# Patient Record
Sex: Male | Born: 1960 | Race: White | Hispanic: No | Marital: Single | Smoking: Never smoker
Health system: Southern US, Community
[De-identification: ages and names within clinical notes are randomized; demographics above are authoritative.]

## PROBLEM LIST (undated history)

## (undated) DIAGNOSIS — N189 Chronic kidney disease, unspecified: Secondary | ICD-10-CM

## (undated) DIAGNOSIS — E119 Type 2 diabetes mellitus without complications: Secondary | ICD-10-CM

## (undated) DIAGNOSIS — R579 Shock, unspecified: Secondary | ICD-10-CM

## (undated) HISTORY — PX: APPENDECTOMY: SHX54

## (undated) HISTORY — PX: BACK SURGERY: SHX140

## (undated) HISTORY — PX: CHOLECYSTECTOMY: SHX55

## (undated) HISTORY — DX: Shock, unspecified: R57.9

## (undated) HISTORY — PX: HERNIA REPAIR: SHX51

---

## 2003-11-20 ENCOUNTER — Ambulatory Visit (HOSPITAL_COMMUNITY): Admission: RE | Admit: 2003-11-20 | Discharge: 2003-11-20 | Payer: Self-pay | Admitting: Internal Medicine

## 2013-12-12 ENCOUNTER — Encounter: Payer: Self-pay | Admitting: Gastroenterology

## 2015-03-05 ENCOUNTER — Other Ambulatory Visit: Payer: Self-pay | Admitting: Occupational Medicine

## 2015-03-05 ENCOUNTER — Ambulatory Visit: Payer: Self-pay

## 2015-03-05 DIAGNOSIS — M5441 Lumbago with sciatica, right side: Secondary | ICD-10-CM

## 2017-06-13 ENCOUNTER — Other Ambulatory Visit: Payer: Self-pay | Admitting: Orthopaedic Surgery

## 2017-06-13 DIAGNOSIS — M4326 Fusion of spine, lumbar region: Secondary | ICD-10-CM

## 2017-06-21 ENCOUNTER — Ambulatory Visit
Admission: RE | Admit: 2017-06-21 | Discharge: 2017-06-21 | Disposition: A | Discharge status: Home / Self Care | Payer: Worker's Compensation | Source: Ambulatory Visit | Attending: Orthopaedic Surgery | Admitting: Orthopaedic Surgery

## 2017-06-21 DIAGNOSIS — M4326 Fusion of spine, lumbar region: Secondary | ICD-10-CM

## 2017-06-21 MED ORDER — GADOBENATE DIMEGLUMINE 529 MG/ML IV SOLN
20.0000 mL | Freq: Once | INTRAVENOUS | Status: AC | PRN
  Administered 2017-06-21: 20 mL via INTRAVENOUS

## 2017-09-11 ENCOUNTER — Ambulatory Visit (HOSPITAL_BASED_OUTPATIENT_CLINIC_OR_DEPARTMENT_OTHER): Payer: Worker's Compensation | Admitting: Physical Medicine & Rehabilitation

## 2017-09-11 ENCOUNTER — Encounter: Payer: Self-pay | Admitting: Physical Medicine & Rehabilitation

## 2017-09-11 ENCOUNTER — Encounter: Payer: Worker's Compensation | Attending: Physical Medicine & Rehabilitation

## 2017-09-11 VITALS — BP 106/71 | HR 108 | Resp 14

## 2017-09-11 DIAGNOSIS — G629 Polyneuropathy, unspecified: Secondary | ICD-10-CM | POA: Insufficient documentation

## 2017-09-11 DIAGNOSIS — M47816 Spondylosis without myelopathy or radiculopathy, lumbar region: Secondary | ICD-10-CM | POA: Diagnosis not present

## 2017-09-11 DIAGNOSIS — M961 Postlaminectomy syndrome, not elsewhere classified: Secondary | ICD-10-CM | POA: Insufficient documentation

## 2017-09-11 DIAGNOSIS — G8929 Other chronic pain: Secondary | ICD-10-CM | POA: Diagnosis not present

## 2017-09-11 DIAGNOSIS — Z79899 Other long term (current) drug therapy: Secondary | ICD-10-CM | POA: Insufficient documentation

## 2017-09-11 NOTE — Patient Instructions (Signed)
Rec Plank exercise please ask PT to show proper technique

## 2017-09-11 NOTE — Progress Notes (Signed)
Subjective:    Patient ID: Allen Russell, male    DOB: May 20, 1961, 57 y.o.   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  Felt onset of pain that same day after full day of work.Saw an MD ~1 wk later. Seen by ortho spine, Dr Noel Geroldohen <3359month post injury.  Pt was treated with prednisone dose pack, tried PT until Dec 2016.  Underwent Lumbar injections per Dr Retia PasseSaullo in 2017 which were not helpful.   L2-3 PSF January 31, 2016 Dr Noel Geroldohen Had 3 mo of walking only.  Started in PT in Dec 2017, did both land based as well as aquatic therapy.  Right lower back pain with hip and thigh have been most affected.  No numbness.  Has had episodes where knees give out.    Pt has had foot numbness for many years, EMG showed sensorimotor polyneuropathy  Prolonged sitting and standing aggravate Right sided low back pain  Tramadol helps with pain.  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 General activity 3 Relation with others 7 Enjoyment of life 7 What TIME of day is your pain at its worst? varies Sleep (in general) Poor  Pain is worse with: bending, sitting and standing Pain improves with: heat/ice, therapy/exercise, medication and TENS Relief from Meds: 6  Mobility walk with assistance use a cane how many minutes can you walk? 45 ability to climb steps?  yes do you drive?  yes Do you have any goals in this area?  yes  Function not employed: date last employed . I need assistance with the following:  household duties  Neuro/Psych weakness trouble walking dizziness  Prior Studies new MRI LUMBAR SPINE WITHOUT AND WITH CONTRAST    TECHNIQUE: Multiplanar and multiecho pulse sequences of the lumbar spine were obtained without and with intravenous contrast.   CONTRAST:  20mL MULTIHANCE GADOBENATE DIMEGLUMINE 529 MG/ML IV SOLN   COM[facility]:  [facility] Postoperative lumbar radiographs 02/02/2016. Intraoperative radiographs 01/31/2016. Preoperative lumbar radiographs 01/28/2016. CT Abdomen and Pelvis 03/28/2011. No prior MRI is available.   FINDINGS: Segmentation:  Normal as demonstrated on the comparisons.   Alignment: Straightening of lower lumbar lordosis has mildly progressed since 2012. Slight retrolisthesis of L2 on L3 is chronic and appears stable from the postoperative radiographs.   Vertebrae: Incidental benign vertebral body hemangioma on the left at T11 has not significantly changed since 2012. Occasional other small benign hemangiomas. Background bone marrow signal is within normal limits. Mild hardware susceptibility artifact at L2-L3. No marrow edema or evidence of acute osseous abnormality. Intact visible sacrum and SI joints.   Conus medullaris and cauda equina: Conus extends to the T12-L1 level. Conus and cauda equina appear normal. No abnormal intradural enhancement. No dural thickening or enhancement identified.   Paraspinal and other soft tissues: Postoperative changes to the posterior upper lumbar paraspinal soft tissues. No postoperative fluid collection. Negative visualized abdominal viscera.   Disc levels:   T10-T11: Mild to moderate facet hypertrophy. No spinal stenosis, but probable moderate right T11 neural foraminal stenosis (series 6, image 1).   T11-12:  Negative.   T12-L1: Minimal  disc bulge and/or subtle right paracentral disc protrusion. No stenosis.   L1-L2: Mild mostly anterior disc bulging and endplate spurring. No stenosis.   L2-L3: Sequelae of decompression, posterior and interbody fusion. Residual right foraminal endplate spurring, but no  definite stenosis.   L3-L4: Mild mostly far lateral disc bulging and endplate spurring. Mild facet hypertrophy. No significant stenosis.   L4-L5: Minimal circumferential disc bulge and endplate spurring. Mild facet hypertrophy. No stenosis.   L5-S1:  Borderline to mild facet hypertrophy.  No stenosis.   IMPRESSION: 1. Prior decompression and fusion at L2-L3. Residual right foraminal broad-based endplate spurring, but no definite stenosis. Lumbar spine CT would best evaluate the degree of postoperative arthrodesis. 2. Only mild adjacent segment disease at L1-L2 and L3-L4 with no convincing neural impingement. 3. Minimal to mild for age lumbar spine degeneration elsewhere. 4. Intermittent lower thoracic disc and posterior element degeneration. Probable associated right T11 neural foraminal stenosis.     Electronically Signed   By: Odessa Fleming M.D.   On: 06/21/2017 09:23  Physicians involved in your care new   History reviewed. No pertinent family history. Social History   Socioeconomic History  . Marital status: Single    Spouse name: None  . Number of children: None  . Years of education: None  . Highest education level: None  Social Needs  . Financial resource strain: None  . Food insecurity - worry: None  . Food insecurity - inability: None  . Transportation needs - medical: None  . Transportation needs - non-medical: None  Occupational History  . None  Tobacco Use  . Smoking status: Never Smoker  . Smokeless tobacco: Never Used  Substance and Sexual Activity  . Alcohol use: None  . Drug use: None  . Sexual activity: None  Other Topics Concern  . None  Social History Narrative  . None   History reviewed. No pertinent surgical history. History reviewed. No pertinent past medical history. BP 106/71 (BP Location: Right Arm, Patient Position: Sitting, Cuff Size: Normal)   Pulse (!) 108   Resp 14   SpO2 98%   Opioid Risk Score:   Fall Risk Score:   `1  Depression screen PHQ 2/9  No flowsheet data found.  Review of Systems  HENT: Negative.   Eyes: Negative.   Respiratory: Negative.   Cardiovascular: Negative.   Gastrointestinal: Negative.   Endocrine: Negative.   Genitourinary: Negative.   Musculoskeletal: Positive for back pain.  Skin: Negative.   Allergic/Immunologic: Negative.   Neurological: Positive for dizziness and weakness.  Hematological: Negative.   Psychiatric/Behavioral: Negative.   All other systems reviewed and are negative.      Objective:   Physical Exam  Constitutional: He is oriented to person, place, and time. He appears well-developed and well-nourished. No distress.  HENT:  Head: Normocephalic and atraumatic.  Eyes: Conjunctivae and EOM are normal. Pupils are equal, round, and reactive to light.  Neck: Normal range of motion. Neck supple.  Cardiovascular: Normal rate and regular rhythm.  Pulmonary/Chest: Effort normal and breath sounds normal. No respiratory distress. He has no wheezes.  Abdominal: Soft. Bowel sounds are normal. He exhibits no distension. There is no tenderness.  Musculoskeletal: He exhibits no edema.       Right knee: Normal.       Left knee: Normal.       Right foot: Normal.       Left foot: Normal.  Negative straight leg raising test  Lumbar spine has 75% flexion  25% extension 25% lateral bending and rotation.  Pain is mainly in the right lumbar area with lumbar extension and with right lateral bending.  Neurological: He is alert and oriented to person, place, and time. He displays no atrophy and no tremor. He exhibits normal muscle tone. Coordination and gait normal.  Reflex Scores:      Tricep reflexes are 2+ on the right side and 2+ on the left side.      Bicep reflexes are 2+ on the right side and 2+ on the left side.      Brachioradialis reflexes are 2+ on the right side and 2+ on the left side.      Patellar reflexes are 1+ on the right side and 2+ on the left side.       Achilles reflexes are 0 on the right side and 0 on the left side. Motor strength is 5/5 bilateral deltoid, bicep, tricep, grip, hip flexor, knee extensor, ankle dorsiflexor Sensation is reduced below the patella in the right lower extremity and below the tibial tubercle on the left lower extremity  Skin: Skin is warm and dry. He is not diaphoretic.  Psychiatric: He has a normal mood and affect. His behavior is normal. Judgment and thought content normal.  Nursing note and vitals reviewed.         Assessment & Plan:  1.  Lumbar postlaminectomy syndrome with chronic pain.  Pain is mainly below the surgical site. I suspect this may be originating from L3-4 L4-5 facet joints.  We will do L2-L3-L4 medial branch blocks on the right side under fluoroscopic guidance.  Agree with Dr. Noel Gerold partial permanent impairment rating of 20% Agree with FCE based permanent restrictions of 20 pound lifting, 45-minute sitting for driving.  8 hours/day limitation  Agree with tramadol 50 mg 3 times daily or 4 times daily as needed for pain Agree with gabapentin, may be able to wean down from 600 4 times daily to a lower dose  Agree with physical therapy, work on core stabilization exercises, have noted on MRI diffuse fatty infiltration of the lumbar extensor muscles.  Would use plank exercises to help build these up.  Would start with kneeling exercises and progress.  2.  Polyneuropathy question etiology does have a history of alcohol abuse in the past may have some residual neuropathy from this. Gabapentin can help with this etiology although it is not a work-related injury.  Prolonged visit: I discussed my recommendations with the patient as well as his case manager Leonard Downing fax [phone]

## 2017-09-28 ENCOUNTER — Encounter: Payer: Self-pay | Attending: Physical Medicine & Rehabilitation

## 2017-09-28 ENCOUNTER — Other Ambulatory Visit: Payer: Self-pay

## 2017-09-28 ENCOUNTER — Encounter: Payer: Self-pay | Admitting: Physical Medicine & Rehabilitation

## 2017-09-28 ENCOUNTER — Ambulatory Visit (HOSPITAL_BASED_OUTPATIENT_CLINIC_OR_DEPARTMENT_OTHER): Payer: Worker's Compensation | Admitting: Physical Medicine & Rehabilitation

## 2017-09-28 VITALS — BP 118/71 | HR 100 | Ht 76.0 in | Wt 220.0 lb

## 2017-09-28 DIAGNOSIS — Z79899 Other long term (current) drug therapy: Secondary | ICD-10-CM | POA: Insufficient documentation

## 2017-09-28 DIAGNOSIS — M47816 Spondylosis without myelopathy or radiculopathy, lumbar region: Secondary | ICD-10-CM

## 2017-09-28 DIAGNOSIS — Z5181 Encounter for therapeutic drug level monitoring: Secondary | ICD-10-CM | POA: Insufficient documentation

## 2017-09-28 MED ORDER — TRAMADOL HCL 50 MG PO TABS: 50.0000 mg | ORAL_TABLET | Freq: Four times a day (QID) | ORAL | 1 refills | Status: DC | PRN

## 2017-09-28 MED ORDER — CYCLOBENZAPRINE HCL 10 MG PO TABS: 10.0000 mg | ORAL_TABLET | Freq: Two times a day (BID) | ORAL | 1 refills | Status: DC | PRN

## 2017-09-28 MED ORDER — GABAPENTIN 600 MG PO TABS: 600.0000 mg | ORAL_TABLET | Freq: Four times a day (QID) | ORAL | 1 refills | Status: DC

## 2017-09-28 NOTE — Progress Notes (Signed)
Right lumbar  L3, L4 medial branch blocks under fluoroscopic guidance  Indication: Right Lumbar pain which is not relieved by medication management or other conservative care and interfering with self-care and mobility.  Informed consent was obtained after describing risks and benefits of the procedure with the patient, this includes bleeding, bruising, infection, paralysis and medication side effects. The patient wishes to proceed and has given written consent. The patient was placed in a prone position. The lumbar area was marked and prepped with Betadine. One ML of 1% lidocaine was injected into each of 3 areas into the skin and subcutaneous tissue.  Then the Right L5 superior articular process in transverse process junction was targeted. Bone contact was made.Isovue 200 was injected x0.5 mL demonstrating no intravascular uptake. Then a solution containing 2% MPF lidocaine was injected x0.5 mL. Then the Right L4 superior articular process in transverse process junction was targeted. Bone contact was made. Isovue 200 was injected x0.5 mL demonstrating no intravascular uptake. Then a solution containing2% MPF lidocaine was injected x0.5 mL .  Marland Kitchen. Post procedure instructions were given. Please refer to post procedure form.  Indication for chronic opioid: Lumbar post laminectomy syndrome Medication and dose: Tramadol 50mg  QID # pills per month: 120 Last UDS date: 09/28/17 Opioid Treatment Agreement signed (Y/N): Y Opioid Treatment Agreement last reviewed with patient:  09/28/2017 [facility] reviewed this encounter (include red flags):  09/28/2017

## 2017-09-28 NOTE — Patient Instructions (Signed)

## 2017-09-28 NOTE — Progress Notes (Signed)
  PROCEDURE RECORD [name]   Name: Allen Russell DOB:[birth year] MRN: [id number]  Date:09/28/2017  Physician: Claudette LawsAndrew Kirsteins, MD    Nurse/CMA: Barbee ShropshireBruce Bright CMA  Allergies:  Allergies  Allergen Reactions  . Morphine Other (See Comments)    headache headache headaches     Consent Signed: Yes.    Is patient diabetic? No.  CBG today?   Pregnant: No. LMP: No LMP for male patient. (age 55-55)  Anticoagulants: no Anti-inflammatory: no Antibiotics: no  Procedure: right L 3-4 Medial Branch Block, Right L5  Dorsal Ramus Position: Prone Start Time: 1146am      End Time:  1148am      Fluoro Time: 25s  RN/CMA Shumaker RN Bright CMA    Time 11:23am 11:55am    BP 118/71 103/69    Pulse 100 97    Respirations 14 16    O2 Sat 97 97    S/S 6 6    Pain Level 4/10 2/10     D/C home with nephew Allen Russell, [name] & O X 3, D/C instructions reviewed, and sits independently.

## 2017-10-05 LAB — TOXASSURE SELECT,+ANTIDEPR,UR

## 2017-10-09 ENCOUNTER — Telehealth: Payer: Self-pay | Admitting: *Deleted

## 2017-10-09 NOTE — Telephone Encounter (Signed)
Urine drug screen for this encounter is consistent for prescribed medication 

## 2017-10-30 ENCOUNTER — Encounter: Payer: Self-pay | Admitting: Physical Medicine & Rehabilitation

## 2017-10-30 ENCOUNTER — Encounter: Payer: Worker's Compensation | Attending: Physical Medicine & Rehabilitation

## 2017-10-30 ENCOUNTER — Other Ambulatory Visit: Payer: Self-pay

## 2017-10-30 ENCOUNTER — Ambulatory Visit (HOSPITAL_BASED_OUTPATIENT_CLINIC_OR_DEPARTMENT_OTHER): Payer: Worker's Compensation | Admitting: Physical Medicine & Rehabilitation

## 2017-10-30 DIAGNOSIS — Z5181 Encounter for therapeutic drug level monitoring: Secondary | ICD-10-CM | POA: Insufficient documentation

## 2017-10-30 DIAGNOSIS — Z79899 Other long term (current) drug therapy: Secondary | ICD-10-CM | POA: Insufficient documentation

## 2017-10-30 DIAGNOSIS — M47816 Spondylosis without myelopathy or radiculopathy, lumbar region: Secondary | ICD-10-CM | POA: Diagnosis present

## 2017-10-30 NOTE — Patient Instructions (Signed)

## 2017-10-30 NOTE — Progress Notes (Signed)
Right lumbar  L3, L4 medial branch blocks under fluoroscopic guidance  Indication: Right Lumbar pain which is not relieved by medication management or other conservative care and interfering with self-care and mobility.  Informed consent was obtained after describing risks and benefits of the procedure with the patient, this includes bleeding, bruising, infection, paralysis and medication side effects. The patient wishes to proceed and has given written consent. The patient was placed in a prone position. The lumbar area was marked and prepped with Betadine. One ML of 1% lidocaine was injected into each of 3 areas into the skin and subcutaneous tissue.  Then the Right L5 superior articular process in transverse process junction was targeted. Bone contact was made.Isovue 200 was injected x0.5 mL demonstrating no intravascular uptake. Then a solution containing 2% MPF lidocaine was injected x0.5 mL. Then the Right L4 superior articular process in transverse process junction was targeted. Bone contact was made. Isovue 200 was injected x0.5 mL demonstrating no intravascular uptake. Then a solution containing2% MPF lidocaine was injected x0.5 mL .  Marland Kitchen Post procedure instructions were given. Please refer to post procedure form.

## 2017-10-30 NOTE — Progress Notes (Signed)
  PROCEDURE RECORD [facility]   Name: Allen Russell DOB:[birth year] MRN: [id number]  Date:10/30/2017  Physician: Claudette Laws, MD    Nurse/CMA: Alyviah Crandle,CMA/Shumaker,RN  Allergies:  Allergies  Allergen Reactions  . Morphine Other (See Comments)    headache headache headaches     Consent Signed: Yes.    Is patient diabetic? No.  CBG today?   Pregnant: No. LMP: No LMP for male patient. (age 55-55)  Anticoagulants: no Anti-inflammatory: no Antibiotics: no  Procedure: Medical Branch Block Position: Prone Start Time: 3:50pm End Time: 3:55pm Fluoro Time:19  RN/CMA Kathalina Ostermann,CMA/Shumaker,RN Genia Perin, CMA/Shumaker,RN    Time 2:21pm 2:29pm    BP 91/64 85/61    Pulse 107 107    Respirations 14 14    O2 Sat 94 93    S/S 6 6    Pain Level 3 2     D/C home with Dad, [name] & O X 3, D/C instructions reviewed, and sits independently.  Denies being symptomatic with low BP. Has been running low previously.  Encouraged to monitor after he gets home.

## 2017-11-20 ENCOUNTER — Telehealth: Payer: Self-pay | Admitting: Physical Medicine & Rehabilitation

## 2017-11-20 NOTE — Telephone Encounter (Signed)
Patient wanted to let Dr. Wynn Banker know that last injection didn't work.  He didn't know if he needed to change the appointment that he has next week for RF.  Please advise.

## 2017-11-20 NOTE — Telephone Encounter (Signed)
May cancel RF and make a follow-up appointment with me

## 2017-11-29 ENCOUNTER — Other Ambulatory Visit: Payer: Self-pay

## 2017-11-29 ENCOUNTER — Ambulatory Visit (HOSPITAL_BASED_OUTPATIENT_CLINIC_OR_DEPARTMENT_OTHER): Payer: Worker's Compensation | Admitting: Physical Medicine & Rehabilitation

## 2017-11-29 ENCOUNTER — Encounter: Payer: Self-pay | Admitting: Physical Medicine & Rehabilitation

## 2017-11-29 ENCOUNTER — Encounter: Payer: Worker's Compensation | Attending: Physical Medicine & Rehabilitation

## 2017-11-29 VITALS — BP 105/71 | HR 82 | Ht 76.0 in | Wt 228.0 lb

## 2017-11-29 DIAGNOSIS — G8929 Other chronic pain: Secondary | ICD-10-CM | POA: Diagnosis not present

## 2017-11-29 DIAGNOSIS — G629 Polyneuropathy, unspecified: Secondary | ICD-10-CM | POA: Diagnosis not present

## 2017-11-29 DIAGNOSIS — M961 Postlaminectomy syndrome, not elsewhere classified: Secondary | ICD-10-CM | POA: Insufficient documentation

## 2017-11-29 DIAGNOSIS — M47816 Spondylosis without myelopathy or radiculopathy, lumbar region: Secondary | ICD-10-CM

## 2017-11-29 DIAGNOSIS — Z79899 Other long term (current) drug therapy: Secondary | ICD-10-CM | POA: Insufficient documentation

## 2017-11-29 NOTE — Progress Notes (Signed)
Subjective:    Patient ID: Allen Russell, male    DOB: [birth year], 57 y.o.   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  Felt onset of pain that same day after full day of work.Saw an MD ~1 wk later. Seen by ortho spine, Dr Noel Gerold <75month post injury.  Pt was treated with prednisone dose pack, tried PT until Dec 2016.  Underwent Lumbar injections per Dr Retia Passe in 2017 which were not helpful.   L2-3 PSF January 31, 2016 Dr Noel Gerold Had 3 mo of walking only.  Started in PT in Dec 2017, did both land based as well as aquatic therapy.  Right lower back pain with hip and thigh have been most affected.  No numbness.  Has had episodes where knees give out.    Pt has had foot numbness for many years, EMG showed sensorimotor polyneuropathy  Prolonged sitting and standing aggravate Right sided low back pain  Tramadol helps with pain.  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  Home Ex is Dumbells for Biceps, triceps and shoulders Therabands for Hip flexors Last formal PT was last weekend  I tried to check the work requirements of his current position.  The copy sent was not legible.  Previous FCE showed patient capable of light duty but reviewing physician did not feel patient's job description could be filled with the restrictions.  09/28/2017 right lumbar  L3, L4 medial branch blocks under fluoroscopic guidance, 50% relief for approximately 1 week  4/30/2019Right lumbar  L3, L4 medial branch blocks under fluoroscopic guidance, less than 50% relief   Considering gym membership which I would support.  Pain Inventory Average Pain 5 Pain Right Now 3 My pain is intermittent, sharp  and stabbing  In the last 24 hours, has pain interfered with the following? General activity 2 Relation with others 1 Enjoyment of life 3 What TIME of day is your pain at its worst? evening Sleep (in general) Poor  Pain is worse with: standing and some activites Pain improves with: heat/ice, medication and TENS Relief from Meds: 3  Mobility walk without assistance how many minutes can you walk? 30 ability to climb steps?  yes do you drive?  yes Do you have any goals in this area?  yes  Function not employed: date last employed 02/26/15 disabled: date disabled 02/26/15 Do you have any goals in this area?  yes  Neuro/Psych No problems in this area  Prior Studies Any changes since last visit?  no  Physicians involved in your care Any changes since last visit?  no   No family history on file. Social History   Socioeconomic History  . Marital status: Single    Spouse name: Not on file  . Number of children: Not on file  . Years of education: Not on file  . Highest education level: Not on file  Occupational History  . Not on file  Social Needs  . Financial resource strain: Not on file  . Food insecurity:    Worry: Not on file    Inability: Not on file  . Transportation needs:    Medical: Not on file    Non-medical:  Not on file  Tobacco Use  . Smoking status: Never Smoker  . Smokeless tobacco: Never Used  Substance and Sexual Activity  . Alcohol use: Not on file  . Drug use: Not on file  . Sexual activity: Not on file  Lifestyle  . Physical activity:    Days per week: Not on file    Minutes per session: Not on file  . Stress: Not on file  Relationships  . Social connections:    Talks on phone: Not on file    Gets together: Not on file    Attends religious service: Not on file    Active member of club or organization: Not on file    Attends meetings of clubs or organizations: Not on file    Relationship status: Not on file  Other Topics Concern  . Not  on file  Social History Narrative  . Not on file   No past surgical history on file. No past medical history on file. BP 105/71   Pulse 82   Ht  (1.93 m)   Wt 228 lb (103.4 kg)   SpO2 96%   BMI 27.75 kg/m   Opioid Risk Score:   Fall Risk Score:  `1  Depression screen PHQ 2/9  Depression screen [facility] 2/9 11/29/2017 10/30/2017 09/28/2017 09/11/2017  Decreased Interest 0 0 0 0  Down, Depressed, Hopeless 0 0 0 -  PHQ - 2 Score 0 0 0 0    Review of Systems  Constitutional: Negative.   HENT: Negative.   Eyes: Negative.   Respiratory: Negative.   Cardiovascular: Negative.   Gastrointestinal: Negative.   Endocrine: Negative.   Genitourinary: Negative.   Musculoskeletal: Negative.   Skin: Negative.   Allergic/Immunologic: Negative.   Neurological: Negative.   Hematological: Negative.   Psychiatric/Behavioral: Negative.   All other systems reviewed and are negative.      Objective:   Physical Exam  Constitutional: He is oriented to person, place, and time. He appears well-developed and well-nourished. No distress.  HENT:  Head: Normocephalic and atraumatic.  Eyes: Pupils are equal, round, and reactive to light. EOM are normal.  Neck: Normal range of motion.  Musculoskeletal: Normal range of motion.  Neurological: He is alert and oriented to person, place, and time.  Skin: Skin is warm and dry. He is not diaphoretic.  Nursing note and vitals reviewed.  Patient has positive Faber's on the right side at the sacroiliac area. Negative straight leg raising Negative femoral stretch test  Left little toe numb to pp Mild Right glut pain with Ext rotation    Assessment & Plan:  1.  Chronic low back pain following L2-L3 fusion.  Patient did not get substantial relief from right L3-L4 medial branch blocks. We discussed other pain generating fractures including L1-L2 facet joint complex, right sacroiliac joint as well as right L2 nerve root. Will proceed with right T12-L1  medial branch block to reduce pain from the L1-L2 facet joint complex. Will advise continued home exercise program as outlined by physical therapy Will need FCE results as well as job description to compare.  Not at MMI from pain management standpoint.  May need to undergo 2 or 3 more procedures to evaluate the pain and potentially additional procedure for neurotomy once pain generator  Identified.  Discussed overall goal reduction of pain medications with patient.  I agree with the nightly cyclobenzaprine avoid daytime use, continued use of gabapentin may be able to reduce dose however patient is not  experiencing any side effects from this medication.  In terms of his opioid, the tramadol dose will continue 3 times daily or 4 times daily for now and should be able to wean at least to a lower dose in the future

## 2017-11-29 NOTE — Patient Instructions (Signed)
Blocking L1,2 facet joint

## 2017-12-03 ENCOUNTER — Telehealth: Payer: Self-pay | Admitting: Physical Medicine & Rehabilitation

## 2017-12-03 NOTE — Telephone Encounter (Signed)
Atty Trula OreSharon Altmon [phone] has faxed (2) emailed and mailed hard copy - Dr. Wynn BankerKirsteins cannot read this document and will not complete it with out copy legible. Emailed atty to advise.

## 2017-12-14 ENCOUNTER — Encounter: Payer: Self-pay | Admitting: Physical Medicine & Rehabilitation

## 2017-12-14 ENCOUNTER — Ambulatory Visit (HOSPITAL_BASED_OUTPATIENT_CLINIC_OR_DEPARTMENT_OTHER): Payer: Worker's Compensation | Admitting: Physical Medicine & Rehabilitation

## 2017-12-14 ENCOUNTER — Encounter: Payer: BLUE CROSS/BLUE SHIELD | Attending: Physical Medicine & Rehabilitation

## 2017-12-14 VITALS — BP 117/77 | HR 101 | Resp 14 | Ht 76.0 in | Wt 227.0 lb

## 2017-12-14 DIAGNOSIS — G629 Polyneuropathy, unspecified: Secondary | ICD-10-CM | POA: Diagnosis not present

## 2017-12-14 DIAGNOSIS — G8929 Other chronic pain: Secondary | ICD-10-CM | POA: Insufficient documentation

## 2017-12-14 DIAGNOSIS — M47816 Spondylosis without myelopathy or radiculopathy, lumbar region: Secondary | ICD-10-CM | POA: Diagnosis not present

## 2017-12-14 DIAGNOSIS — M961 Postlaminectomy syndrome, not elsewhere classified: Secondary | ICD-10-CM | POA: Insufficient documentation

## 2017-12-14 DIAGNOSIS — Z79899 Other long term (current) drug therapy: Secondary | ICD-10-CM | POA: Diagnosis not present

## 2017-12-14 MED ORDER — GABAPENTIN 600 MG PO TABS: 600.0000 mg | ORAL_TABLET | Freq: Four times a day (QID) | ORAL | 1 refills | Status: DC

## 2017-12-14 MED ORDER — TRAMADOL HCL 50 MG PO TABS: 50.0000 mg | ORAL_TABLET | Freq: Four times a day (QID) | ORAL | 1 refills | Status: DC | PRN

## 2017-12-14 MED ORDER — CYCLOBENZAPRINE HCL 10 MG PO TABS: 10.0000 mg | ORAL_TABLET | Freq: Two times a day (BID) | ORAL | 1 refills | Status: DC | PRN

## 2017-12-14 NOTE — Progress Notes (Signed)
  PROCEDURE RECORD [facility]   Name: Allen Russell DOB:03/27/1961 MRN: [id number]  Date:12/14/2017  Physician: Claudette LawsAndrew Kirsteins, MD    Nurse/CMA: Bobette Leyh, CMA  Allergies:  Allergies  Allergen Reactions  . Morphine Other (See Comments)    headache headache headaches     Consent Signed: Yes.    Is patient diabetic? No.  CBG today?   Pregnant: No. LMP: No LMP for male patient. (age 55-55)  Anticoagulants: no Anti-inflammatory: no Antibiotics: no  Procedure: right T12,L1 medial branch block  Position: Prone Start Time: 3:16pm  End Time:3:26pm   Fluoro Time: 22  RN/CMA Uilani Sanville, CMA Jacquelinne Speak, CMA    Time 3:00pm 3:30pm    BP 117/77 122/75    Pulse 101 98    Respirations 14 14    O2 Sat 97 99    S/S 6 6    Pain Level 6/10 3/10     D/C home with dad, [name] & O X 3, D/C instructions reviewed, and sits independently.

## 2017-12-14 NOTE — Progress Notes (Signed)
Right T 12, L1,  medial branch blocks under fluoroscopic guidance  Indication: Right Lumbar pain which is not relieved by medication management or other conservative care and interfering with self-care and mobility.  Informed consent was obtained after describing risks and benefits of the procedure with the patient, this includes bleeding, bruising, infection, paralysis and medication side effects. The patient wishes to proceed and has given written consent. The patient was placed in a prone position. The lumbar area was marked and prepped with Betadine. One ML of 1% lidocaine was injected into each of 2 areas into the skin and subcutaneous tissue. Then a 22-gauge 3.5in spinal needle was inserted targeting the junction of the the Right L2 superior articular process / transverse process . Bone contact was made. Omnipaque 180 was injected x0.5 mL demonstrating no intravascular uptake. Then a solution containing 2% MPF lidocaine was injected x0.5 mL. Then the Right L1 superior articular process in transverse process junction was targeted. Bone contact was made. Omnipaque 180 was injected x0.5 mL demonstrating no intravascular uptake. Then a solution containing  2% MPF lidocaine was injected x0.5 mL. Patient tolerated procedure well. Post procedure instructions were given. Please refer to post procedure form.

## 2017-12-14 NOTE — Patient Instructions (Signed)
As discussed

## 2017-12-24 ENCOUNTER — Telehealth: Payer: Self-pay

## 2017-12-24 NOTE — Telephone Encounter (Signed)
Pt called stating he had good relief for 7 days from last Medial Branch Block and next appt is 7/12. He is requesting to move the appt up for another Medial Branch Block or Radiofrequency.

## 2017-12-31 NOTE — Telephone Encounter (Signed)
I think we could change the next appointment to radiofrequency neurotomy right L3-4-5

## 2018-01-01 NOTE — Telephone Encounter (Signed)
Mr  [place] notified.

## 2018-01-08 ENCOUNTER — Ambulatory Visit: Payer: BLUE CROSS/BLUE SHIELD | Admitting: Physical Medicine & Rehabilitation

## 2018-01-09 ENCOUNTER — Telehealth: Payer: Self-pay | Admitting: Physical Medicine & Rehabilitation

## 2018-01-09 NOTE — Telephone Encounter (Signed)
What did [employer] deny ?RF Do we have a letter?

## 2018-01-09 NOTE — Telephone Encounter (Signed)
Patient wants to know reason [employer] denied - request call from you please AK.

## 2018-01-11 ENCOUNTER — Ambulatory Visit: Payer: Self-pay | Admitting: Physical Medicine & Rehabilitation

## 2018-01-14 NOTE — Telephone Encounter (Signed)
Not sure why it was denied, you can search my desk for it and I'll look at it tomorrow

## 2018-01-21 ENCOUNTER — Encounter: Payer: Self-pay | Admitting: Physical Medicine & Rehabilitation

## 2018-01-21 ENCOUNTER — Other Ambulatory Visit: Payer: Self-pay

## 2018-01-21 ENCOUNTER — Ambulatory Visit: Payer: BLUE CROSS/BLUE SHIELD | Admitting: Physical Medicine & Rehabilitation

## 2018-01-21 ENCOUNTER — Encounter: Payer: BLUE CROSS/BLUE SHIELD | Attending: Physical Medicine & Rehabilitation

## 2018-01-21 VITALS — BP 111/76 | HR 103 | Ht 76.0 in | Wt 230.4 lb

## 2018-01-21 DIAGNOSIS — G629 Polyneuropathy, unspecified: Secondary | ICD-10-CM | POA: Insufficient documentation

## 2018-01-21 DIAGNOSIS — M47816 Spondylosis without myelopathy or radiculopathy, lumbar region: Secondary | ICD-10-CM | POA: Diagnosis not present

## 2018-01-21 DIAGNOSIS — M961 Postlaminectomy syndrome, not elsewhere classified: Secondary | ICD-10-CM | POA: Diagnosis present

## 2018-01-21 DIAGNOSIS — G8929 Other chronic pain: Secondary | ICD-10-CM | POA: Diagnosis present

## 2018-01-21 DIAGNOSIS — Z79899 Other long term (current) drug therapy: Secondary | ICD-10-CM | POA: Insufficient documentation

## 2018-01-21 NOTE — Progress Notes (Signed)
Subjective:    Patient ID: Allen Russell, male    DOB: 02-Jun-1961, 57 y.o.   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  Felt onset of pain that same day after full day of work.Saw an MD ~1 wk later. Seen by ortho spine, Dr Noel Gerold <4month post injury.  Pt was treated with prednisone dose pack, tried PT until Dec 2016.  Underwent Lumbar injections per Dr Retia Passe in 2017 which were not helpful.   L2-3 PSF January 31, 2016 Dr Noel Gerold Had 3 mo of walking only.  Started in PT in Dec 2017, did both land based as well as aquatic therapy.  Right lower back pain with hip and thigh have been most affected.  No numbness.  Has had episodes where knees give out.    Pt has had foot numbness for many years, EMG showed sensorimotor polyneuropathy  Prolonged sitting and standing aggravate Right sided low back pain HPI Patient returns today reporting that the right T12-L1 injection on 12/14/2017 afforded him the greatest amount of relief thus far.  He had an immediate 50% relief of pain but also was able to increase his activity level for several days afterwards.  He was working in his mother's garden for 5 hours one day without pain.  Previous medial branch blocks on the right side L3-4-5 could not reproduce 50% pain relief on the second injection. Pain Inventory Average Pain 6 Pain Right Now 6 My pain is sharp and stabbing  In the last 24 hours, has pain interfered with the following? General activity 4 Relation with others 4 Enjoyment of life 4 What TIME of day is your pain at its worst? evening night Sleep (in general) Fair  Pain is worse with: bending Pain improves with: rest and medication Relief from Meds: 4  Mobility Do you have any goals in this area?  no  Function Do you have any goals in this area?  no  Neuro/Psych No problems in this area  Prior Studies Any changes since last visit?  no  Physicians involved in your care Any changes  since last visit?  no   No family history on file. Social History   Socioeconomic History  . Marital status: Single    Spouse name: Not on file  . Number of children: Not on file  . Years of education: Not on file  . Highest education level: Not on file  Occupational History  . Not on file  Social Needs  . Financial resource strain: Not on file  . Food insecurity:    Worry: Not on file    Inability: Not on file  . Transportation needs:    Medical: Not on file    Non-medical: Not on file  Tobacco Use  . Smoking status: Never Smoker  . Smokeless tobacco: Never Used  Substance and Sexual Activity  . Alcohol use: Not on file  . Drug use: Not on file  . Sexual activity: Not on file  Lifestyle  . Physical activity:    Days per week: Not on file    Minutes per session: Not on file  . Stress: Not on file  Relationships  . Social connections:    Talks on phone: Not on file    Gets together: Not on file    Attends religious service: Not on file    Active member of club or organization: Not on file    Attends meetings of  clubs or organizations: Not on file    Relationship status: Not on file  Other Topics Concern  . Not on file  Social History Narrative  . Not on file   No past surgical history on file. No past medical history on file. BP 111/76   Pulse (!) 103   Ht 6\' 4"  (1.93 m)   Wt 230 lb 6.4 oz (104.5 kg)   SpO2 97%   BMI 28.05 kg/m   Opioid Risk Score:   Fall Risk Score:  `1  Depression screen PHQ 2/9  [facility] 01/21/2018 11/29/2017 10/30/2017 09/28/2017 09/11/2017  Decreased Interest 0 0 0 0 0  Down, Depressed, Hopeless 0 0 0 0 -  PHQ - 2 Score 0 0 0 0 0   Review of Systems  Constitutional: Negative.   HENT: Negative.   Eyes: Negative.   Respiratory: Negative.   Cardiovascular: Negative.   Gastrointestinal: Negative.   Endocrine: Negative.   Genitourinary: Negative.   Musculoskeletal: Negative.   Skin: Negative.     Allergic/Immunologic: Negative.   Neurological: Negative.   Hematological: Negative.   Psychiatric/Behavioral: Negative.   All other systems reviewed and are negative.      Objective:   Physical Exam  Constitutional: He is oriented to person, place, and time. He appears well-developed and well-nourished.  HENT:  Head: Normocephalic and atraumatic.  Musculoskeletal:  Pain to palpation right L1-L2-L3 paraspinal area.  This increases with movement.  Has limited lumbar range of motion of approximately 50% in all planes Ambulates without assistive device no evidence of toe drag or knee instability.  Neurological: He is alert and oriented to person, place, and time.  Nursing note and vitals reviewed.         Assessment & Plan:  1.  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 Continue tramadol 50 milligrams 4 times daily.  2.  Lumbar spondylosis  most symptomatic at levels above the fusion.  Would recommend repeat T12-L1 given his excellent response greater than 50% relief with improved function following the prior T12-L1 medial branch block performed approximately 1 month ago.  If he obtains a similar relief with the repeat T12-L1 medial branch block, would proceed to radiofrequency neurotomy of the same levels on the right side  I would not recommend right medial branch radiofrequency neurotomy at L3-L4-L5 given lack of reproducibility with the second set of medial branch blocks.

## 2018-01-25 ENCOUNTER — Encounter: Payer: Self-pay | Admitting: Physical Medicine & Rehabilitation

## 2018-01-25 ENCOUNTER — Ambulatory Visit (HOSPITAL_BASED_OUTPATIENT_CLINIC_OR_DEPARTMENT_OTHER): Payer: BLUE CROSS/BLUE SHIELD | Admitting: Physical Medicine & Rehabilitation

## 2018-01-25 VITALS — BP 104/72 | HR 95 | Resp 14 | Ht 76.0 in | Wt 227.0 lb

## 2018-01-25 DIAGNOSIS — M47816 Spondylosis without myelopathy or radiculopathy, lumbar region: Secondary | ICD-10-CM

## 2018-01-25 DIAGNOSIS — G8929 Other chronic pain: Secondary | ICD-10-CM | POA: Diagnosis not present

## 2018-01-25 NOTE — Patient Instructions (Signed)

## 2018-01-25 NOTE — Progress Notes (Signed)
  PROCEDURE RECORD [facility]   Name: Allen Russell DOB:05-23-1961 MRN: [id number]  Date:01/25/2018  Physician: Claudette LawsAndrew Kirsteins, MD    Nurse/CMA: Brydon Spahr, CMA  Allergies:  Allergies  Allergen Reactions  . Morphine Other (See Comments)    headache headache headaches     Consent Signed: Yes.    Is patient diabetic? No.  CBG today?   Pregnant: No. LMP: No LMP for male patient. (age 55-55)  Anticoagulants: no Anti-inflammatory: no Antibiotics: no  Procedure: right T12, L1 medial branch block  Position: Prone Start Time: 9:26am   End Time: 9:35am  Fluoro Time: 21s  RN/CMA [name], CMA [name], CMA    Time 9:00am 9:36am    BP 104/72 104/72    Pulse 95 94    Respirations 16 16    O2 Sat 96 98    S/S 6 6    Pain Level 4/10 1/10     D/C home with dad, [name] & O X 3, D/C instructions reviewed, and sits independently.

## 2018-01-25 NOTE — Progress Notes (Signed)
Right T 12, L1,  medial branch blocks under fluoroscopic guidance  Indication: Right Lumbar pain which is not relieved by medication management or other conservative care and interfering with self-care and mobility.  Informed consent was obtained after describing risks and benefits of the procedure with the patient, this includes bleeding, bruising, infection, paralysis and medication side effects. The patient wishes to proceed and has given written consent. The patient was placed in a prone position. The lumbar area was marked and prepped with Betadine. One ML of 1% lidocaine was injected into each of 2 areas into the skin and subcutaneous tissue. Then a 22-gauge 3.5in spinal needle was inserted targeting the junction of the the Right L2 superior articular process / transverse process . Bone contact was made. Omnipaque 180 was injected x0.5 mL demonstrating no intravascular uptake. Then a solution containing 2% MPF lidocaine was injected x0.5 mL. Then the Right L1 superior articular process in transverse process junction was targeted. Bone contact was made. Omnipaque 180 was injected x0.5 mL demonstrating no intravascular uptake. Then a solution containing  2% MPF lidocaine was injected x0.5 mL. Patient tolerated procedure well. Post procedure instructions were given. Please refer to post procedure form.

## 2018-01-29 ENCOUNTER — Ambulatory Visit: Payer: BLUE CROSS/BLUE SHIELD | Admitting: Physical Medicine & Rehabilitation

## 2018-01-29 ENCOUNTER — Ambulatory Visit: Payer: BLUE CROSS/BLUE SHIELD

## 2018-02-21 ENCOUNTER — Other Ambulatory Visit: Payer: Self-pay | Admitting: Physical Medicine & Rehabilitation

## 2018-02-22 NOTE — Telephone Encounter (Signed)
This was called in yesterday.

## 2018-02-25 ENCOUNTER — Ambulatory Visit: Payer: BLUE CROSS/BLUE SHIELD | Admitting: Physical Medicine & Rehabilitation

## 2018-02-25 ENCOUNTER — Encounter: Payer: BLUE CROSS/BLUE SHIELD | Attending: Physical Medicine & Rehabilitation

## 2018-02-25 DIAGNOSIS — M47816 Spondylosis without myelopathy or radiculopathy, lumbar region: Secondary | ICD-10-CM

## 2018-02-25 DIAGNOSIS — G8929 Other chronic pain: Secondary | ICD-10-CM | POA: Diagnosis present

## 2018-02-25 DIAGNOSIS — Z79899 Other long term (current) drug therapy: Secondary | ICD-10-CM | POA: Insufficient documentation

## 2018-02-25 DIAGNOSIS — G629 Polyneuropathy, unspecified: Secondary | ICD-10-CM | POA: Insufficient documentation

## 2018-02-25 DIAGNOSIS — M961 Postlaminectomy syndrome, not elsewhere classified: Secondary | ICD-10-CM | POA: Diagnosis present

## 2018-02-25 NOTE — Progress Notes (Signed)
  PROCEDURE RECORD [facility]   Name: Allen Russell DOB:[birth year] MRN: [id number]  Date:02/25/2018  Physician: Claudette LawsAndrew Kirsteins, MD    Nurse/Allen Russell: Nedra HaiLee, Allen Russell  Allergies:  Allergies  Allergen Reactions  . Morphine Other (See Comments)    headache headache headaches     Consent Signed: Yes.    Is patient diabetic? Yes.    CBG today?  Pregnant: No. LMP: No LMP for male patient. (age 55-55)  Anticoagulants: no Anti-inflammatory: no Antibiotics: no  Procedure: Radiofrequency Position: Prone Start Time: 11:00am End Time: 11:13am Fluoro Time:23  RN/Allen Russell Allen Russell,Allen Russell Allen Russell, Allen Russell    Time 10:21am 11:20am    BP 109/73 100/68    Pulse 88 82    Respirations 14 14    O2 Sat 97 98    S/S 6 6    Pain Level 4/10 1/10     D/C home with Dad , [name] & O X 3, D/C instructions reviewed, and sits independently.

## 2018-02-25 NOTE — Patient Instructions (Signed)

## 2018-02-25 NOTE — Progress Notes (Signed)
Right T12,L1 medial branch radio frequency neurotomy under fluoroscopic guidance   Indication: Low back pain due to lumbar spondylosis which has been relieved on 2 occasions by greater than 50% by lumbar medial branch blocks at corresponding levels.  Informed consent was obtained after describing risks and benefits of the procedure with the patient, this includes bleeding, bruising, infection, paralysis and medication side effects. The patient wishes to proceed and has given written consent. The patient was placed in a prone position. The lumbar and sacral area was marked and prepped with Betadine. A 25-gauge 1-1/2 inch needle was inserted into the skin and subcutaneous tissue at 3 sites in one ML of 1% lidocaine was injected into each site. Then a 18-gauge 10cm cm radio frequency needle with a 1 cm curved active tip was inserted targeting the  Right L2 SAP/transverse process junction . Bone contact was made and confirmed with lateral imaging.  motor stimulation at 2 Hz confirm proper needle location followed by injection of one ML 1% MPF lidocaine. Then the Right L1 SAP/transverse process junction was targeted. Bone contact was made and confirmed with lateral imaging. motor stimulation at 2 Hz confirm proper needle location followed by injection of one ML 1% MPF lidocaine. Radio frequency lesion being at [facility]80C for 90 seconds was performed. Needles were removed. Post procedure instructions and vital signs were performed. Patient tolerated procedure well. Followup appointment was given.

## 2018-04-22 ENCOUNTER — Other Ambulatory Visit: Payer: Self-pay | Admitting: Physical Medicine & Rehabilitation

## 2018-05-20 ENCOUNTER — Encounter: Payer: Self-pay | Admitting: Physical Medicine & Rehabilitation

## 2018-05-20 ENCOUNTER — Encounter: Payer: BLUE CROSS/BLUE SHIELD | Attending: Physical Medicine & Rehabilitation

## 2018-05-20 ENCOUNTER — Ambulatory Visit: Payer: BLUE CROSS/BLUE SHIELD | Admitting: Physical Medicine & Rehabilitation

## 2018-05-20 VITALS — BP 127/83 | HR 90 | Ht 76.0 in | Wt 238.0 lb

## 2018-05-20 DIAGNOSIS — M47816 Spondylosis without myelopathy or radiculopathy, lumbar region: Secondary | ICD-10-CM | POA: Insufficient documentation

## 2018-05-20 DIAGNOSIS — G8929 Other chronic pain: Secondary | ICD-10-CM | POA: Diagnosis present

## 2018-05-20 DIAGNOSIS — M961 Postlaminectomy syndrome, not elsewhere classified: Secondary | ICD-10-CM | POA: Diagnosis not present

## 2018-05-20 DIAGNOSIS — Z79899 Other long term (current) drug therapy: Secondary | ICD-10-CM | POA: Insufficient documentation

## 2018-05-20 DIAGNOSIS — G629 Polyneuropathy, unspecified: Secondary | ICD-10-CM | POA: Insufficient documentation

## 2018-05-20 NOTE — Progress Notes (Signed)
Test cancelled by provider, patient not taken medication for over a week

## 2018-05-20 NOTE — Progress Notes (Signed)
Subjective:    Patient ID: Allen Russell, male    DOB: Feb 19, 1961, 57 y.o.   MRN: [id number]  HPI Right T12,L1 medial branch radio frequency neurotomy under fluoroscopic guidance  02/25/18 No longer needs tramadol Gabapentin, if misses dose has bilateral leg and foot pain Elliptical 1-2 times a day Slowly advancing free weights  Just diagnosed with diabetes now on metformin On Gabapentin 600mg  QID Pain Inventory Average Pain 1 Pain Right Now 0 My pain is intermittent, sharp and stabbing  In the last 24 hours, has pain interfered with the following? General activity 1 Relation with others 1 Enjoyment of life 1 What TIME of day is your pain at its worst? daytime Sleep (in general) Fair  Pain is worse with: some activites Pain improves with: rest, medication and injections Relief from Meds: 2  Mobility walk without assistance ability to climb steps?  yes do you drive?  yes  Function not employed: date last employed na  Neuro/Psych No problems in this area  Prior Studies Any changes since last visit?  no  Physicians involved in your care Any changes since last visit?  no   No family history on file. Social History   Socioeconomic History  . Marital status: Single    Spouse name: Not on file  . Number of children: Not on file  . Years of education: Not on file  . Highest education level: Not on file  Occupational History  . Not on file  Social Needs  . Financial resource strain: Not on file  . Food insecurity:    Worry: Not on file    Inability: Not on file  . Transportation needs:    Medical: Not on file    Non-medical: Not on file  Tobacco Use  . Smoking status: Never Smoker  . Smokeless tobacco: Never Used  Substance and Sexual Activity  . Alcohol use: Not on file  . Drug use: Not on file  . Sexual activity: Not on file  Lifestyle  . Physical activity:    Days per week: Not on file    Minutes per session: Not on file  . Stress: Not on  file  Relationships  . Social connections:    Talks on phone: Not on file    Gets together: Not on file    Attends religious service: Not on file    Active member of club or organization: Not on file    Attends meetings of clubs or organizations: Not on file    Relationship status: Not on file  Other Topics Concern  . Not on file  Social History Narrative  . Not on file   No past surgical history on file. No past medical history on file. BP 127/83   Pulse 90   Ht 6\' 4"  (1.93 m)   Wt 238 lb (108 kg)   SpO2 97%   BMI 28.97 kg/m   Opioid Risk Score:   Fall Risk Score:  `1  Depression screen PHQ 2/9  Depression screen [facility] 2/9 01/21/2018 11/29/2017 10/30/2017 09/28/2017 09/11/2017  Decreased Interest 0 0 0 0 0  Down, Depressed, Hopeless 0 0 0 0 -  PHQ - 2 Score 0 0 0 0 0     Review of Systems  Constitutional: Negative.   HENT: Negative.   Eyes: Negative.   Respiratory: Negative.   Cardiovascular: Negative.   Gastrointestinal: Negative.   Endocrine: Negative.   Genitourinary: Negative.   Musculoskeletal: Negative.   Skin: Negative.  Allergic/Immunologic: Negative.   Neurological: Negative.   Hematological: Negative.   Psychiatric/Behavioral: Negative.   All other systems reviewed and are negative.      Objective:   Physical Exam  Constitutional: He is oriented to person, place, and time. He appears well-developed and well-nourished.  HENT:  Head: Normocephalic and atraumatic.  Musculoskeletal: Normal range of motion.  Neurological: He is alert and oriented to person, place, and time.  Nursing note and vitals reviewed. Motor 5/5 in Bilateral HF, KE, ADF Sensation reduced pp below supra malleolar area Neg SLR  DTR 2+ bilateral patellar and 0 at achilles       Assessment & Plan:  1.  Hx of L2-3 fusion with pain originating from R L1-2 joint improved temporarily after R T12-L1 Medial branch block and longer term relief with RF of same nerves.  As d/w pt no  way to predict duration.Pt will call once pain returns on consistent basis  No tramadol needed  2.  Diabetic peripheral neuropathy cont gabapentin 600mg  QID

## 2018-06-23 ENCOUNTER — Other Ambulatory Visit: Payer: Self-pay | Admitting: Physical Medicine & Rehabilitation

## 2018-09-05 ENCOUNTER — Other Ambulatory Visit: Payer: Self-pay | Admitting: Physical Medicine & Rehabilitation

## 2021-03-21 ENCOUNTER — Other Ambulatory Visit: Payer: Self-pay

## 2021-03-21 ENCOUNTER — Encounter (HOSPITAL_COMMUNITY): Payer: Self-pay | Admitting: Emergency Medicine

## 2021-03-21 ENCOUNTER — Inpatient Hospital Stay (HOSPITAL_COMMUNITY)
Admission: EM | Admit: 2021-03-21 | Discharge: 2021-04-07 | DRG: 637 | Disposition: A | Discharge status: Home / Self Care | Payer: Self-pay | Attending: Internal Medicine | Admitting: Internal Medicine

## 2021-03-21 DIAGNOSIS — E1110 Type 2 diabetes mellitus with ketoacidosis without coma: Principal | ICD-10-CM | POA: Diagnosis present

## 2021-03-21 DIAGNOSIS — R350 Frequency of micturition: Secondary | ICD-10-CM | POA: Diagnosis present

## 2021-03-21 DIAGNOSIS — Z885 Allergy status to narcotic agent status: Secondary | ICD-10-CM

## 2021-03-21 DIAGNOSIS — E876 Hypokalemia: Secondary | ICD-10-CM | POA: Diagnosis present

## 2021-03-21 DIAGNOSIS — R0602 Shortness of breath: Secondary | ICD-10-CM

## 2021-03-21 DIAGNOSIS — N170 Acute kidney failure with tubular necrosis: Secondary | ICD-10-CM | POA: Diagnosis present

## 2021-03-21 DIAGNOSIS — X58XXXA Exposure to other specified factors, initial encounter: Secondary | ICD-10-CM | POA: Diagnosis present

## 2021-03-21 DIAGNOSIS — F1010 Alcohol abuse, uncomplicated: Secondary | ICD-10-CM | POA: Diagnosis present

## 2021-03-21 DIAGNOSIS — I459 Conduction disorder, unspecified: Secondary | ICD-10-CM | POA: Diagnosis not present

## 2021-03-21 DIAGNOSIS — Z8349 Family history of other endocrine, nutritional and metabolic diseases: Secondary | ICD-10-CM

## 2021-03-21 DIAGNOSIS — E8729 Other acidosis: Secondary | ICD-10-CM

## 2021-03-21 DIAGNOSIS — T730XXA Starvation, initial encounter: Secondary | ICD-10-CM | POA: Diagnosis present

## 2021-03-21 DIAGNOSIS — J90 Pleural effusion, not elsewhere classified: Secondary | ICD-10-CM | POA: Diagnosis present

## 2021-03-21 DIAGNOSIS — Z79899 Other long term (current) drug therapy: Secondary | ICD-10-CM

## 2021-03-21 DIAGNOSIS — G8929 Other chronic pain: Secondary | ICD-10-CM | POA: Diagnosis present

## 2021-03-21 DIAGNOSIS — K861 Other chronic pancreatitis: Secondary | ICD-10-CM | POA: Diagnosis present

## 2021-03-21 DIAGNOSIS — G901 Familial dysautonomia [Riley-Day]: Secondary | ICD-10-CM

## 2021-03-21 DIAGNOSIS — E872 Acidosis: Secondary | ICD-10-CM

## 2021-03-21 DIAGNOSIS — Z8249 Family history of ischemic heart disease and other diseases of the circulatory system: Secondary | ICD-10-CM

## 2021-03-21 DIAGNOSIS — E2740 Unspecified adrenocortical insufficiency: Secondary | ICD-10-CM | POA: Diagnosis present

## 2021-03-21 DIAGNOSIS — D649 Anemia, unspecified: Secondary | ICD-10-CM | POA: Diagnosis present

## 2021-03-21 DIAGNOSIS — K921 Melena: Secondary | ICD-10-CM | POA: Diagnosis present

## 2021-03-21 DIAGNOSIS — R9389 Abnormal findings on diagnostic imaging of other specified body structures: Secondary | ICD-10-CM

## 2021-03-21 DIAGNOSIS — R627 Adult failure to thrive: Secondary | ICD-10-CM

## 2021-03-21 DIAGNOSIS — E860 Dehydration: Secondary | ICD-10-CM | POA: Diagnosis present

## 2021-03-21 DIAGNOSIS — R571 Hypovolemic shock: Secondary | ICD-10-CM | POA: Diagnosis not present

## 2021-03-21 DIAGNOSIS — Z23 Encounter for immunization: Secondary | ICD-10-CM

## 2021-03-21 DIAGNOSIS — R579 Shock, unspecified: Secondary | ICD-10-CM

## 2021-03-21 DIAGNOSIS — R634 Abnormal weight loss: Secondary | ICD-10-CM | POA: Diagnosis present

## 2021-03-21 DIAGNOSIS — Z6827 Body mass index (BMI) 27.0-27.9, adult: Secondary | ICD-10-CM

## 2021-03-21 DIAGNOSIS — E43 Unspecified severe protein-calorie malnutrition: Secondary | ICD-10-CM | POA: Insufficient documentation

## 2021-03-21 DIAGNOSIS — Z7984 Long term (current) use of oral hypoglycemic drugs: Secondary | ICD-10-CM

## 2021-03-21 DIAGNOSIS — Z20822 Contact with and (suspected) exposure to covid-19: Secondary | ICD-10-CM | POA: Diagnosis present

## 2021-03-21 DIAGNOSIS — K863 Pseudocyst of pancreas: Secondary | ICD-10-CM | POA: Diagnosis present

## 2021-03-21 DIAGNOSIS — E861 Hypovolemia: Secondary | ICD-10-CM | POA: Diagnosis present

## 2021-03-21 DIAGNOSIS — Z981 Arthrodesis status: Secondary | ICD-10-CM

## 2021-03-21 DIAGNOSIS — E1142 Type 2 diabetes mellitus with diabetic polyneuropathy: Secondary | ICD-10-CM | POA: Diagnosis present

## 2021-03-21 DIAGNOSIS — K8681 Exocrine pancreatic insufficiency: Secondary | ICD-10-CM | POA: Diagnosis present

## 2021-03-21 DIAGNOSIS — R578 Other shock: Secondary | ICD-10-CM | POA: Diagnosis present

## 2021-03-21 HISTORY — DX: Other acidosis: E87.29

## 2021-03-21 HISTORY — DX: Type 2 diabetes mellitus without complications: E11.9

## 2021-03-21 LAB — CBC WITH DIFFERENTIAL/PLATELET
Abs Immature Granulocytes: 0.04 10*3/uL (ref 0.00–0.07)
Basophils Absolute: 0 10*3/uL (ref 0.0–0.1)
Basophils Relative: 1 %
Eosinophils Absolute: 0 10*3/uL (ref 0.0–0.5)
Eosinophils Relative: 1 %
HCT: 41.8 % (ref 39.0–52.0)
Hemoglobin: 14.7 g/dL (ref 13.0–17.0)
Immature Granulocytes: 1 %
Lymphocytes Relative: 16 %
Lymphs Abs: 0.8 10*3/uL (ref 0.7–4.0)
MCH: 30.9 pg (ref 26.0–34.0)
MCHC: 35.2 g/dL (ref 30.0–36.0)
MCV: 87.8 fL (ref 80.0–100.0)
Monocytes Absolute: 0.8 10*3/uL (ref 0.1–1.0)
Monocytes Relative: 16 %
Neutro Abs: 3.3 10*3/uL (ref 1.7–7.7)
Neutrophils Relative %: 65 %
Platelets: 202 10*3/uL (ref 150–400)
RBC: 4.76 MIL/uL (ref 4.22–5.81)
RDW: 16 % — ABNORMAL HIGH (ref 11.5–15.5)
WBC: 5 10*3/uL (ref 4.0–10.5)
nRBC: 0 % (ref 0.0–0.2)

## 2021-03-21 LAB — COMPREHENSIVE METABOLIC PANEL
ALT: 22 U/L (ref 0–44)
AST: 17 U/L (ref 15–41)
Albumin: 4.4 g/dL (ref 3.5–5.0)
Alkaline Phosphatase: 95 U/L (ref 38–126)
Anion gap: 20 — ABNORMAL HIGH (ref 5–15)
BUN: 24 mg/dL — ABNORMAL HIGH (ref 6–20)
CO2: 9 mmol/L — ABNORMAL LOW (ref 22–32)
Calcium: 9.4 mg/dL (ref 8.9–10.3)
Chloride: 106 mmol/L (ref 98–111)
Creatinine, Ser: 1.35 mg/dL — ABNORMAL HIGH (ref 0.61–1.24)
GFR, Estimated: >60 mL/min (ref >60)
Glucose, Bld: 363 mg/dL — ABNORMAL HIGH (ref 70–99)
Potassium: 3.2 mmol/L — ABNORMAL LOW (ref 3.5–5.1)
Sodium: 135 mmol/L (ref 135–145)
Total Bilirubin: 2.1 mg/dL — ABNORMAL HIGH (ref 0.3–1.2)
Total Protein: 7.4 g/dL (ref 6.5–8.1)

## 2021-03-21 LAB — BASIC METABOLIC PANEL
Anion gap: 12 (ref 5–15)
BUN: 21 mg/dL — ABNORMAL HIGH (ref 6–20)
CO2: 13 mmol/L — ABNORMAL LOW (ref 22–32)
Calcium: 9 mg/dL (ref 8.9–10.3)
Chloride: 110 mmol/L (ref 98–111)
Creatinine, Ser: 1.06 mg/dL (ref 0.61–1.24)
GFR, Estimated: >60 mL/min (ref >60)
Glucose, Bld: 148 mg/dL — ABNORMAL HIGH (ref 70–99)
Potassium: 3 mmol/L — ABNORMAL LOW (ref 3.5–5.1)
Sodium: 135 mmol/L (ref 135–145)

## 2021-03-21 LAB — CBG MONITORING, ED
Glucose-Capillary: 356 mg/dL — ABNORMAL HIGH (ref 70–99)
Glucose-Capillary: 367 mg/dL — ABNORMAL HIGH (ref 70–99)
Glucose-Capillary: 316 mg/dL — ABNORMAL HIGH (ref 70–99)
Glucose-Capillary: 285 mg/dL — ABNORMAL HIGH (ref 70–99)
Glucose-Capillary: 251 mg/dL — ABNORMAL HIGH (ref 70–99)

## 2021-03-21 LAB — BETA-HYDROXYBUTYRIC ACID: Beta-Hydroxybutyric Acid: >8 mmol/L — ABNORMAL HIGH (ref 0.05–0.27)

## 2021-03-21 LAB — URINALYSIS, ROUTINE W REFLEX MICROSCOPIC
Bilirubin Urine: NEGATIVE
Glucose, UA: >=500 mg/dL — AB
Ketones, ur: 80 mg/dL — AB
Leukocytes,Ua: NEGATIVE
Nitrite: NEGATIVE
Protein, ur: 30 mg/dL — AB
Specific Gravity, Urine: 1.025 (ref 1.005–1.030)
pH: 6 (ref 5.0–8.0)

## 2021-03-21 LAB — PROTIME-INR
INR: 1.1 (ref 0.8–1.2)
Prothrombin Time: 13.9 seconds (ref 11.4–15.2)

## 2021-03-21 LAB — BLOOD GAS, ARTERIAL
Acid-base deficit: 23 mmol/L — ABNORMAL HIGH (ref 0.0–2.0)
Bicarbonate: 4.3 mmol/L — ABNORMAL LOW (ref 20.0–28.0)
Drawn by: 29503
FIO2: 21
O2 Saturation: 91.5 %
Patient temperature: 98.6
pCO2 arterial: <19 mmHg — CL (ref 32.0–48.0)
pH, Arterial: 7.191 — CL (ref 7.350–7.450)
pO2, Arterial: 75.8 mmHg — ABNORMAL LOW (ref 83.0–108.0)

## 2021-03-21 LAB — CK: Total CK: 104 U/L (ref 49–397)

## 2021-03-21 LAB — GLUCOSE, CAPILLARY
Glucose-Capillary: 185 mg/dL — ABNORMAL HIGH (ref 70–99)
Glucose-Capillary: 162 mg/dL — ABNORMAL HIGH (ref 70–99)

## 2021-03-21 LAB — MAGNESIUM: Magnesium: 2.2 mg/dL (ref 1.7–2.4)

## 2021-03-21 LAB — APTT: aPTT: 25 seconds (ref 24–36)

## 2021-03-21 LAB — MRSA NEXT GEN BY PCR, NASAL: MRSA by PCR Next Gen: NOT DETECTED

## 2021-03-21 LAB — BRAIN NATRIURETIC PEPTIDE: B Natriuretic Peptide: 41.7 pg/mL (ref 0.0–100.0)

## 2021-03-21 LAB — PHOSPHORUS: Phosphorus: 3.4 mg/dL (ref 2.5–4.6)

## 2021-03-21 MED ORDER — DEXTROSE 50 % IV SOLN: 0.0000 mL | INTRAVENOUS | Status: DC | PRN

## 2021-03-21 MED ORDER — SODIUM CHLORIDE 0.9 % IV BOLUS
1000.0000 mL | Freq: Once | INTRAVENOUS | Status: AC
  Administered 2021-03-21: 1000 mL via INTRAVENOUS

## 2021-03-21 MED ORDER — ORAL CARE MOUTH RINSE
15.0000 mL | Freq: Two times a day (BID) | OROMUCOSAL | Status: DC
  Administered 2021-03-21 – 2021-03-22 (×2): 15 mL via OROMUCOSAL

## 2021-03-21 MED ORDER — POTASSIUM CHLORIDE 10 MEQ/100ML IV SOLN
10.0000 meq | INTRAVENOUS | Status: AC
  Administered 2021-03-21 (×4): 10 meq via INTRAVENOUS
  Filled 2021-03-21 (×4): qty 100

## 2021-03-21 MED ORDER — POLYETHYLENE GLYCOL 3350 17 G PO PACK: 17.0000 g | PACK | Freq: Two times a day (BID) | ORAL | Status: DC | PRN

## 2021-03-21 MED ORDER — ENOXAPARIN SODIUM 40 MG/0.4ML IJ SOSY
40.0000 mg | PREFILLED_SYRINGE | INTRAMUSCULAR | Status: DC
  Administered 2021-03-22 – 2021-04-07 (×17): 40 mg via SUBCUTANEOUS
  Filled 2021-03-21 (×18): qty 0.4

## 2021-03-21 MED ORDER — DOCUSATE SODIUM 100 MG PO CAPS
100.0000 mg | ORAL_CAPSULE | Freq: Two times a day (BID) | ORAL | Status: DC | PRN
Start: 2021-03-21 — End: 2021-04-07

## 2021-03-21 MED ORDER — POTASSIUM CHLORIDE CRYS ER 20 MEQ PO TBCR
40.0000 meq | EXTENDED_RELEASE_TABLET | Freq: Once | ORAL | Status: AC
  Administered 2021-03-21: 40 meq via ORAL
  Filled 2021-03-21: qty 2

## 2021-03-21 MED ORDER — INSULIN REGULAR(HUMAN) IN NACL 100-0.9 UT/100ML-% IV SOLN
INTRAVENOUS | Status: DC
  Administered 2021-03-21: 13 [IU]/h via INTRAVENOUS
  Administered 2021-03-22: 3.8 [IU]/h via INTRAVENOUS
  Filled 2021-03-21 (×2): qty 100

## 2021-03-21 MED ORDER — ONDANSETRON HCL 4 MG/2ML IJ SOLN: 4.0000 mg | Freq: Four times a day (QID) | INTRAMUSCULAR | Status: DC | PRN

## 2021-03-21 MED ORDER — CHLORHEXIDINE GLUCONATE CLOTH 2 % EX PADS
6.0000 | MEDICATED_PAD | Freq: Every day | CUTANEOUS | Status: DC
  Administered 2021-03-24 – 2021-04-07 (×14): 6 via TOPICAL

## 2021-03-21 MED ORDER — LACTATED RINGERS IV BOLUS
1000.0000 mL | Freq: Once | INTRAVENOUS | Status: AC
  Administered 2021-03-21: 1000 mL via INTRAVENOUS

## 2021-03-21 MED ORDER — LACTATED RINGERS IV BOLUS
250.0000 mL | Freq: Once | INTRAVENOUS | Status: AC
  Administered 2021-03-22: 250 mL via INTRAVENOUS

## 2021-03-21 MED ORDER — DEXTROSE IN LACTATED RINGERS 5 % IV SOLN: INTRAVENOUS | Status: DC

## 2021-03-21 MED ORDER — INFLUENZA VAC SPLIT QUAD 0.5 ML IM SUSY
0.5000 mL | PREFILLED_SYRINGE | INTRAMUSCULAR | Status: DC
  Filled 2021-03-21 (×2): qty 0.5

## 2021-03-21 MED ORDER — ACETAMINOPHEN 500 MG PO TABS
1000.0000 mg | ORAL_TABLET | Freq: Three times a day (TID) | ORAL | Status: DC | PRN
  Administered 2021-03-23: 1000 mg via ORAL
  Filled 2021-03-21 (×2): qty 2

## 2021-03-21 MED ORDER — ONDANSETRON 4 MG PO TBDP: 4.0000 mg | ORAL_TABLET | Freq: Three times a day (TID) | ORAL | Status: DC | PRN

## 2021-03-21 MED ORDER — LACTATED RINGERS IV BOLUS: 20.0000 mL/kg | Freq: Once | INTRAVENOUS | Status: DC

## 2021-03-21 MED ORDER — LACTATED RINGERS IV SOLN: INTRAVENOUS | Status: DC

## 2021-03-21 NOTE — ED Triage Notes (Signed)
Patient states he lost about 50 lbs in 3 months due to a poor appetite. He also reports weakness which began 4 weeks ago, When walking short distances he notices shortness of breath, dizziness and weakness. About 2 days ago he notices blood when wiping post stools. He does report constipation for a week.

## 2021-03-21 NOTE — H&P (Signed)
History and Physical    Allen Russell:[id number] DOB: [birth year] DOA: 03/21/2021  PCP: Steve Rattler, PA-C  Patient coming from: home  I have personally briefly reviewed patient's old medical records in [facility]  Chief Complaint: weight loss, weakness and dyspnea  HPI: Allen Russell is a 60 y.o. 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 EGD 03/29/18 with removal of Axios stent), L2-L3 posterior spinal fusion surgery, who presented with weight loss, weakness, and dyspnea on exertion.  Pt reported weight loss of about 50 lbs in the past 2-3 months, although he admitted to financial difficulties and apparently couldn't afford foods and medications.  Pt reported drinking only 2 protein drinks on most days.  Pt feels thirsty and drinks a lot of water, with normal amount of urine output.  Pt also complained of more weakness, fatigue, dyspnea with minimum exertion in the past 2-3 months.    Pt said his diabetes was well controlled previously with good diet and exercises, until he lost his activity tolerance.  Pt only took metformin.  Pt reported pancreatic deficiency for which he had to take pancreatic enzymes.  Pt denied recent alcohol use.  No abdominal pain.  Pt reported being constipated and noted blood on the wipe after passing hard stools.  ED Course: initial vitals: afebrile, HR 75, BP 100/79, sating 100% on room air at rest.  Labs notable for K+ 3.2, BG 363, gap 20, bicarb 9, Cr 1.35 (baseline ~1), BUN 24, ABG pH 7.19, pCO2 <19.  Pt received 2L bolus f/b MIVF, and started on insulin gtt per [employer].    Assessment/Plan Active Problems:   Ketoacidosis  # Likely DKA --KetoAcid level  is still pending, but pt presented with acidosis and elevated BG, likely in DKA.  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  Other Ddx is starvation ketosis.   --s/p IVF and started on insulin gtt per [facility] Plan: --cont insulin gtt per [employer] --BMP q8h to monitor gap and bicarb --cont MIVF@125  --A1c, serum osm  # Hypokalemia --BMP q8h  --potassium supplement to ensure potassium >=4  # AKI, POA --Cr 1.35 on presentation (baseline ~1), likely due to dehydration. --cont MIVF as above  # Orthostasis due to dehydration --BP dropped from systolic 92 sitting to 78 standing up.  HR went from 73 sitting to 94 standing up. --cont MIVF as above  # Weight loss 2/2 starvation --could be both from reduced calorie intake and possible DM1 status.   DVT prophylaxis: Lovenox SQ Code Status: Full code  Family Communication: sister updated at bedside today  Disposition Plan: home  Consults called: none Level of care: Stepdown   Review of Systems: As per HPI otherwise complete review of systems negative.   Past Medical History:  Diagnosis Date   Diabetes mellitus without complication (HCC)     Past Surgical History:  Procedure Laterality Date   APPENDECTOMY     BACK SURGERY     CHOLECYSTECTOMY     HERNIA REPAIR       reports that he has never smoked. He has never used smokeless tobacco. No history on file for alcohol use and drug use.  Allergies  Allergen Reactions   Morphine  Other (See Comments)    headache      Family History  Problem Relation Age of Onset   Breast cancer Mother    Thyroid disease Mother    Breast cancer Sister    Pancreatic cancer Maternal Grandmother    Ovarian cancer Maternal Grandmother    Hypertension Paternal Grandfather     Prior to Admission medications   Medication Sig Start Date End Date Taking? Authorizing Provider  diphenhydrAMINE (BENADRYL) 25 MG tablet Take 25 mg  by mouth every 6 (six) hours as needed for allergies.   Yes [provider]  gabapentin (NEURONTIN) 600 MG tablet TAKE 1 TABLET BY MOUTH 4 TIMES DAILY Patient taking differently: Take 300 mg by mouth daily. 06/24/18  Yes Kirsteins, Victorino Sparrow, MD  metFORMIN (GLUCOPHAGE-XR) 500 MG 24 hr tablet Take 250 mg by mouth daily with breakfast.   Yes [provider]  Multiple Vitamin (MULTIVITAMIN) tablet Take 1 tablet by mouth daily.   Yes [provider]  ranitidine (ZANTAC) 150 MG tablet Take 150 mg by mouth daily as needed for heartburn.   Yes [provider]  traMADol (ULTRAM) 50 MG tablet TAKE 1 TABLET BY MOUTH EVERY 6 HOURS AS NEEDED Patient taking differently: Take 50 mg by mouth daily as needed for moderate pain. 02/21/18  Yes Kirsteins, Victorino Sparrow, MD  cyclobenzaprine (FLEXERIL) 10 MG tablet Take 1 tablet (10 mg total) by mouth 2 (two) times daily between meals as needed. Patient not taking: No sig reported 12/14/17   Erick Colace, MD    Physical Exam: Vitals:   03/21/21 1853 03/21/21 1958 03/21/21 2000 03/21/21 2030  BP:  122/69 122/69 92/71  Pulse:  63 68 67  Resp:  15 15 20   Temp:    98.4 F (36.9 C)  TempSrc:    Oral  SpO2:  99% 100% 100%  Weight: 74.4 kg     Height: 6\' 4"  (1.93 m)       Constitutional: NAD, AAOx3, temporal wasting HEENT: conjunctivae and lids normal, EOMI CV: No cyanosis.   RESP: increase RR, lungs clear, on RA Extremities: No effusions, edema in BLE SKIN: warm, dry Neuro: II - XII grossly intact.   Psych: Normal mood and affect.  Appropriate judgement and reason   Labs on Admission: I have personally reviewed following labs and imaging studies  CBC: Recent Labs  Lab 03/21/21 1551  WBC 5.0  NEUTROABS 3.3  HGB 14.7  HCT 41.8  MCV 87.8  PLT 202   Basic Metabolic Panel: Recent Labs  Lab 03/21/21 1551  NA 135  K 3.2*  CL 106  CO2 9*  GLUCOSE 363*  BUN 24*  CREATININE 1.35*  CALCIUM 9.4  MG 2.2  PHOS  3.4   GFR: Estimated Creatinine Clearance: 63.5 mL/min (A) (by C-G formula based on SCr of 1.35 mg/dL (H)). Liver Function Tests: Recent Labs  Lab 03/21/21 1551  AST 17  ALT 22  ALKPHOS 95  BILITOT 2.1*  PROT 7.4  ALBUMIN 4.4   No results for input(s): LIPASE, AMYLASE in the last 168 hours. No results for input(s): AMMONIA in the last 168 hours. Coagulation Profile: Recent Labs  Lab 03/21/21 1551  INR 1.1   Cardiac Enzymes: Recent Labs  Lab 03/21/21 1551  CKTOTAL 104   BNP (last 3 results) No results for input(s): PROBNP in the last 8760 hours. HbA1C: No results for input(s): HGBA1C in the last 72 hours. CBG: Recent Labs  Lab 03/21/21 1537 03/21/21 1746  03/21/21 1856 03/21/21 1959  GLUCAP 356* 367* 316* 285*   Lipid Profile: No results for input(s): CHOL, HDL, LDLCALC, TRIG, CHOLHDL, LDLDIRECT in the last 72 hours. Thyroid Function Tests: No results for input(s): TSH, T4TOTAL, FREET4, T3FREE, THYROIDAB in the last 72 hours. Anemia Panel: No results for input(s): VITAMINB12, FOLATE, FERRITIN, TIBC, IRON, RETICCTPCT in the last 72 hours. Urine analysis:    Component Value Date/Time   COLORURINE YELLOW 03/21/2021 1459   APPEARANCEUR CLEAR 03/21/2021 1459   LABSPEC 1.025 03/21/2021 1459   PHURINE 6.0 03/21/2021 1459   GLUCOSEU >=500 (A) 03/21/2021 1459   HGBUR SMALL (A) 03/21/2021 1459   BILIRUBINUR NEGATIVE 03/21/2021 1459   KETONESUR 80 (A) 03/21/2021 1459   PROTEINUR 30 (A) 03/21/2021 1459   NITRITE NEGATIVE 03/21/2021 1459   LEUKOCYTESUR NEGATIVE 03/21/2021 1459    Radiological Exams on Admission: No results found.    Darlin Priestly MD [facility]  If 7PM-7AM, please contact night-coverage 03/21/2021, 8:59 PM

## 2021-03-21 NOTE — ED Notes (Signed)
ED TO INPATIENT HANDOFF REPORT  ED Nurse Name and Phone #: [phone] Charise Carwin. RN   S Name/Age/Gender Allen Russell 60 y.o. male Room/Bed: WA01/WA01  Code Status   Code Status: Not on file  Home/SNF/Other Home Patient oriented to: self, place, time, and situation Is this baseline? Yes   Triage Complete: Triage complete  Chief Complaint Ketoacidosis [E87.2]  Triage Note Patient states he lost about 50 lbs in 3 months due to a poor appetite. He also reports weakness which began 4 weeks ago, When walking short distances he notices shortness of breath, dizziness and weakness. About 2 days ago he notices blood when wiping post stools. He does report constipation for a week.     Allergies Allergies  Allergen Reactions   Morphine Other (See Comments)    headache      Level of Care/Admitting Diagnosis ED Disposition     ED Disposition  Admit   Condition  --   Comment  Hospital Area: [facility] [[id number]]  Level of Care: Stepdown [14]  Admit to SDU based on following criteria: Severe physiological/psychological symptoms:  Any diagnosis requiring assessment & intervention at least every 4 hours on an ongoing basis to obtain desired patient outcomes including stability and rehabilitation  May admit patient to [facility] or [facility] if equivalent level of care is available:: Yes  Covid Evaluation: Asymptomatic Screening Protocol (No Symptoms)  Diagnosis: Ketoacidosis [322025]  Admitting Physician: Darlin Priestly [[id number]]  Attending Physician: Darlin Priestly [[id number]]  Estimated length of stay: past midnight tomorrow  Certification:: I certify this patient will need inpatient services for at least 2 midnights          B Medical/Surgery History Past Medical History:  Diagnosis Date   Diabetes mellitus without complication (HCC)    Past Surgical History:  Procedure Laterality Date   APPENDECTOMY     BACK SURGERY     CHOLECYSTECTOMY     HERNIA  REPAIR       A IV Location/Drains/Wounds Patient Lines/Drains/Airways Status     Active Line/Drains/Airways     Name Placement date Placement time Site Days   Peripheral IV 03/21/21 20 G 1" Right Antecubital 03/21/21  1530  Antecubital  less than 1   Peripheral IV 03/21/21 20 G 1" Left Antecubital 03/21/21  1747  Antecubital  less than 1            Intake/Output Last 24 hours  Intake/Output Summary (Last 24 hours) at 03/21/2021 2054 Last data filed at 03/21/2021 2003 Gross per 24 hour  Intake 2260.18 ml  Output --  Net 2260.18 ml    Labs/Imaging Results for orders placed or performed during the hospital encounter of 03/21/21 (from the past 48 hour(s))  Urinalysis, Routine w reflex microscopic Urine, Clean Catch     Status: Abnormal   Collection Time: 03/21/21  2:59 PM  Result Value Ref Range   Color, Urine YELLOW YELLOW   APPearance CLEAR CLEAR   Specific Gravity, Urine 1.025 1.005 - 1.030   pH 6.0 5.0 - 8.0   Glucose, UA >=500 (A) NEGATIVE mg/dL   Hgb urine dipstick SMALL (A) NEGATIVE   Bilirubin Urine NEGATIVE NEGATIVE   Ketones, ur 80 (A) NEGATIVE mg/dL   Protein, ur 30 (A) NEGATIVE mg/dL   Nitrite NEGATIVE NEGATIVE   Leukocytes,Ua NEGATIVE NEGATIVE   RBC / HPF 0-5 0 - 5 RBC/hpf   WBC, UA 0-5 0 - 5 WBC/hpf   Bacteria, UA RARE (A) NONE  SEEN   Squamous Epithelial / LPF 0-5 0 - 5   Mucus PRESENT     Comment: Performed at [facility], [address], [place], [place] [address]  POC CBG, ED     Status: Abnormal   Collection Time: 03/21/21  3:37 PM  Result Value Ref Range   Glucose-Capillary 356 (H) 70 - 99 mg/dL    Comment: Glucose reference range applies only to samples taken after fasting for at least 8 hours.  Comprehensive metabolic panel     Status: Abnormal   Collection Time: 03/21/21  3:51 PM  Result Value Ref Range   Sodium 135 135 - 145 mmol/L   Potassium 3.2 (L) 3.5 - 5.1 mmol/L   Chloride 106 98 - 111 mmol/L   CO2 9 (L) 22 - 32  mmol/L   Glucose, Bld 363 (H) 70 - 99 mg/dL    Comment: Glucose reference range applies only to samples taken after fasting for at least 8 hours.   BUN 24 (H) 6 - 20 mg/dL   Creatinine, Ser 9.62 (H) 0.61 - 1.24 mg/dL   Calcium 9.4 8.9 - 83.6 mg/dL   Total Protein 7.4 6.5 - 8.1 g/dL   Albumin 4.4 3.5 - 5.0 g/dL   AST 17 15 - 41 U/L   ALT 22 0 - 44 U/L   Alkaline Phosphatase 95 38 - 126 U/L   Total Bilirubin 2.1 (H) 0.3 - 1.2 mg/dL   GFR, Estimated >62 >94 mL/min    Comment: (NOTE) Calculated using the CKD-EPI Creatinine Equation (2021)    Anion gap 20 (H) 5 - 15    Comment: Performed at [facility], [address], [place], [place] [address]  Magnesium     Status: None   Collection Time: 03/21/21  3:51 PM  Result Value Ref Range   Magnesium 2.2 1.7 - 2.4 mg/dL    Comment: Performed at [facility], [address], [place], [place] [address]  Phosphorus     Status: None   Collection Time: 03/21/21  3:51 PM  Result Value Ref Range   Phosphorus 3.4 2.5 - 4.6 mg/dL    Comment: Performed at [facility], [address] [place], [place] [address]  CBC with Differential     Status: Abnormal   Collection Time: 03/21/21  3:51 PM  Result Value Ref Range   WBC 5.0 4.0 - 10.5 K/uL   RBC 4.76 4.22 - 5.81 MIL/uL   Hemoglobin 14.7 13.0 - 17.0 g/dL   HCT 27.5 17.0 - 01.7 %   MCV 87.8 80.0 - 100.0 fL   MCH 30.9 26.0 - 34.0 pg   MCHC 35.2 30.0 - 36.0 g/dL   RDW 49.4 (H) 49.6 - 75.9 %   Platelets 202 150 - 400 K/uL   nRBC 0.0 0.0 - 0.2 %   Neutrophils Relative % 65 %   Neutro Abs 3.3 1.7 - 7.7 K/uL   Lymphocytes Relative 16 %   Lymphs Abs 0.8 0.7 - 4.0 K/uL   Monocytes Relative 16 %   Monocytes Absolute 0.8 0.1 - 1.0 K/uL   Eosinophils Relative 1 %   Eosinophils Absolute 0.0 0.0 - 0.5 K/uL   Basophils Relative 1 %   Basophils Absolute 0.0 0.0 - 0.1 K/uL   Immature Granulocytes 1 %   Abs Immature Granulocytes 0.04 0.00 - 0.07  K/uL    Comment: Performed at [facility], [address] [place],  [place] [address]  CK     Status: None   Collection Time: 03/21/21  3:51 PM  Result Value Ref Range   Total CK 104 49 - 397 U/L    Comment: Performed at [facility], [address] [place], [place] [address]  Protime-INR     Status: None   Collection Time: 03/21/21  3:51 PM  Result Value Ref Range   Prothrombin Time 13.9 11.4 - 15.2 seconds   INR 1.1 0.8 - 1.2    Comment: (NOTE) INR goal varies based on device and disease states. Performed at [facility], [address] [place], [place] [address]   APTT     Status: None   Collection Time: 03/21/21  3:51 PM  Result Value Ref Range   aPTT 25 24 - 36 seconds    Comment: Performed at [facility], [address] [place], [place] [address]  Brain natriuretic peptide     Status: None   Collection Time: 03/21/21  3:52 PM  Result Value Ref Range   B Natriuretic Peptide 41.7 0.0 - 100.0 pg/mL    Comment: Performed at [facility], [address], [place], [place] [address]  CBG monitoring, ED     Status: Abnormal   Collection Time: 03/21/21  5:46 PM  Result Value Ref Range   Glucose-Capillary 367 (H) 70 - 99 mg/dL    Comment: Glucose reference range applies only to samples taken after fasting for at least 8 hours.  Beta-hydroxybutyric acid     Status: Abnormal   Collection Time: 03/21/21  6:06 PM  Result Value Ref Range   Beta-Hydroxybutyric Acid >8.00 (H) 0.05 - 0.27 mmol/L    Comment: RESULTS CONFIRMED BY MANUAL DILUTION Performed at [facility], [address], [place], [place] [address]   Blood gas, arterial     Status: Abnormal   Collection Time: 03/21/21  6:08 PM  Result Value Ref Range   FIO2 21.00    pH, Arterial 7.191 (LL) 7.350 - 7.450    Comment: CRITICAL RESULT CALLED TO, READ BACK BY AND VERIFIED WITH: BANNO,A. RN @1823  ON  03/21/2021 BY COHEN,K    pCO2 arterial <19.0 (LL) 32.0 - 48.0 mmHg    Comment: CRITICAL RESULT CALLED TO, READ BACK BY AND VERIFIED WITH: BANNO,A. RN @1823  ON 03/21/2021 BY COHEN,K    pO2, Arterial 75.8 (L) 83.0 - 108.0 mmHg   Bicarbonate 4.3 (L) 20.0 - 28.0 mmol/L   Acid-base deficit 23.0 (H) 0.0 - 2.0 mmol/L   O2 Saturation 91.5 %   Patient temperature 98.6    Collection site RIGHT BRACHIAL    Drawn by [phone]    Sample type ARTERIAL    Allens test (pass/fail) PASS PASS    Comment: Performed at [facility], [address] [place], [place]  CBG monitoring, ED     Status: Abnormal   Collection Time: 03/21/21  6:56 PM  Result Value Ref Range   Glucose-Capillary 316 (H) 70 - 99 mg/dL    Comment: Glucose reference range applies only to samples taken after fasting for at least 8 hours.  CBG monitoring, ED     Status: Abnormal   Collection Time: 03/21/21  7:59 PM  Result Value Ref Range   Glucose-Capillary 285 (H) 70 - 99 mg/dL    Comment: Glucose reference range applies only to samples taken after fasting for at least 8 hours.   No results found.  Pending Labs Unresulted Labs (From admission, onward)     Start     Ordered   03/22/21 0500  CBC  Daily,   R      03/21/21 1848   03/22/21 0500  Magnesium  Daily,   R      03/21/21 1848   03/22/21 0000  Basic metabolic panel  Now then every 8 hours,   R (with STAT occurrences)      03/21/21 2029   03/21/21 1741  Hemoglobin A1c  Add-on,   AD        03/21/21 1740   03/21/21 1740  Osmolality  Add-on,   AD        03/21/21 1739   03/21/21 1720  SARS CORONAVIRUS 2 (TAT 6-24 HRS) Nasopharyngeal Nasopharyngeal Swab  (Tier 3 - Symptomatic/asymptomatic)  Once,   STAT       Question Answer Comment  Is this test for diagnosis or screening Screening   Symptomatic for COVID-19 as defined by CDC No   Hospitalized for COVID-19 No   Admitted to ICU for COVID-19 No   Previously tested for COVID-19 No   Resident in a  congregate (group) care setting No   Employed in healthcare setting No   Has patient completed COVID vaccination(s) (2 doses of Pfizer/Moderna 1 dose of [employer]) Unknown      03/21/21 1719   03/21/21 1716  Beta-hydroxybutyric acid  (Diabetes Ketoacidosis (DKA))  Now then every 8 hours,   STAT      03/21/21 1717   Signed and Held  HIV Antibody (routine testing w rflx)  (HIV Antibody (Routine testing w reflex) panel)  Once,   R        Signed and Held            Vitals/Pain Today's Vitals   03/21/21 1853 03/21/21 1958 03/21/21 2000 03/21/21 2030  BP:  122/69 122/69 92/71  Pulse:  63 68 67  Resp:  15 15 20   Temp:    98.4 F (36.9 C)  TempSrc:    Oral  SpO2:  99% 100% 100%  Weight: 74.4 kg     Height: 6\' 4"  (1.93 m)     PainSc:        Isolation Precautions No active isolations  Medications Medications  insulin regular, human (MYXREDLIN) 100 units/ 100 mL infusion (13 Units/hr Intravenous New Bag/Given 03/21/21 1910)  lactated ringers infusion ( Intravenous New Bag/Given 03/21/21 1908)  dextrose 5 % in lactated ringers infusion (has no administration in time range)  dextrose 50 % solution 0-50 mL (has no administration in time range)  potassium chloride 10 mEq in 100 mL IVPB (10 mEq Intravenous New Bag/Given 03/21/21 2005)  influenza vac split quadrivalent PF (FLUARIX) injection 0.5 mL (has no administration in time range)  acetaminophen (TYLENOL) tablet 1,000 mg (has no administration in time range)  docusate sodium (COLACE) capsule 100 mg (has no administration in time range)  polyethylene glycol (MIRALAX / GLYCOLAX) packet 17 g (has no administration in time range)  ondansetron (ZOFRAN-ODT) disintegrating tablet 4 mg (has no administration in time range)  ondansetron (ZOFRAN) injection 4 mg (has no administration in time range)  sodium chloride 0.9 % bolus 1,000 mL (0 mLs Intravenous Stopped 03/21/21 1746)  lactated ringers bolus 1,000 mL (0 mLs Intravenous Stopped  03/21/21 1852)  potassium chloride SA (KLOR-CON) CR tablet 40 mEq (40 mEq Oral Given 03/21/21 2006)    Mobility walks Moderate fall risk   Focused Assessments  R Recommendations: See Admitting Provider Note  Report given to:   Additional Notes:

## 2021-03-21 NOTE — ED Notes (Signed)
Pt able to drink coke and eat sandwich

## 2021-03-21 NOTE — ED Notes (Signed)
Patient ambulated with pulse ox, maintained O2 sats between 97-100%, with no dizziness or lightheadedness.

## 2021-03-21 NOTE — ED Provider Notes (Signed)
[facility]-EMERGENCY DEPT Provider Note   CSN: [id number] Arrival date & time: 03/21/21  1115     History Chief Complaint  Patient presents with   Rectal Bleeding   Weakness   Shortness of Breath    Allen Russell is a 60 y.o. male.  HPI     60 year old male comes in with chief complaint of weakness, shortness of breath, rectal bleeding.  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  Patient reports that over the last 3 or 4 months his health has declined quickly.  He has lost about 50 pounds of weight and lost his insurance.  He has been rationing his metformin and pancreatic supplements since then, but cannot afford the medications.  He has been living with his uncle and aunt in their garage apartment.  Sister went to visit and noticed that the patient was significantly weak and brought him to the ER.  Patient states that 3 months ago he was able to work out for couple of hours, now he can even get to the bathroom or fix meals for him.  He gets short of breath easily.  No new cough, no history of COVID, no URI-like symptoms, fevers, chills.  He had an episode of bloody stool today.  Patient denies any history of cancer.  He has been sober for several months now.   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 No pertinent family history.  Social History   Tobacco Use   Smoking status: Never   Smokeless tobacco: Never    Home Medications Prior to Admission medications   Medication Sig Start Date End  Date Taking? Authorizing Provider  B Complex-Biotin-FA (SUPER B-COMPLEX PO) Take 2 tablets by mouth.    [provider]  cyclobenzaprine (FLEXERIL) 10 MG tablet Take 1 tablet (10 mg total) by mouth 2 (two) times daily between meals as needed. 12/14/17   Kirsteins, Victorino Sparrow, MD  gabapentin (NEURONTIN) 600 MG tablet TAKE 1 TABLET BY MOUTH 4 TIMES DAILY 06/24/18   Kirsteins, Victorino Sparrow, MD  Multiple Vitamin (MULTIVITAMIN) tablet Take 1 tablet by mouth daily.    [provider]  Potassium 99 MG TABS Take 99 mg by mouth daily.    [provider]  ranitidine (ZANTAC) 150 MG tablet Take 150 mg by mouth as needed for heartburn.    [provider]  traMADol (ULTRAM) 50 MG tablet TAKE 1 TABLET BY MOUTH EVERY 6 HOURS AS NEEDED 02/21/18   Kirsteins, Victorino Sparrow, MD    Allergies    Morphine  Review of Systems   Review of Systems  Constitutional:  Positive for activity change.  Respiratory:  Positive for shortness of breath.   Allergic/Immunologic: Positive for immunocompromised state.  Neurological:  Positive for weakness.  All other systems reviewed and are negative.  Physical Exam Updated Vital Signs BP 109/70   Pulse (!) 54   Temp 98.5 F (36.9 C) (Rectal)   Resp 13   SpO2 99%   Physical  Exam Vitals and nursing note reviewed.  Constitutional:      Appearance: He is well-developed.  HENT:     Head: Atraumatic.  Neck:     Vascular: No JVD.  Cardiovascular:     Rate and Rhythm: Normal rate.  Pulmonary:     Effort: Pulmonary effort is normal.  Musculoskeletal:     Cervical back: Neck supple.     Right lower leg: No edema.     Left lower leg: No edema.  Skin:    General: Skin is warm.  Neurological:     Mental Status: He is alert and oriented to person, place, and time.    ED Results / Procedures / Treatments   Labs (all labs ordered are listed, but only abnormal results are displayed) Labs Reviewed  COMPREHENSIVE METABOLIC PANEL - Abnormal;  Notable for the following components:      Result Value   Potassium 3.2 (*)    CO2 9 (*)    Glucose, Bld 363 (*)    BUN 24 (*)    Creatinine, Ser 1.35 (*)    Total Bilirubin 2.1 (*)    Anion gap 20 (*)    All other components within normal limits  URINALYSIS, ROUTINE W REFLEX MICROSCOPIC - Abnormal; Notable for the following components:   Glucose, UA >=500 (*)    Hgb urine dipstick SMALL (*)    Ketones, ur 80 (*)    Protein, ur 30 (*)    Bacteria, UA RARE (*)    All other components within normal limits  CBC WITH DIFFERENTIAL/PLATELET - Abnormal; Notable for the following components:   RDW 16.0 (*)    All other components within normal limits  CBG MONITORING, ED - Abnormal; Notable for the following components:   Glucose-Capillary 356 (*)    All other components within normal limits  SARS CORONAVIRUS 2 (TAT 6-24 HRS)  MAGNESIUM  PHOSPHORUS  CK  PROTIME-INR  APTT  BRAIN NATRIURETIC PEPTIDE  BETA-HYDROXYBUTYRIC ACID  BETA-HYDROXYBUTYRIC ACID  BLOOD GAS, VENOUS  POC OCCULT BLOOD, ED  CBG MONITORING, ED    EKG EKG Interpretation  Date/Time:  Monday March 21 2021 15:05:52 EDT Ventricular Rate:  59 PR Interval:  162 QRS Duration: 121 QT Interval:  418 QTC Calculation: 415 R Axis:   87 Text Interpretation: Sinus rhythm Nonspecific intraventricular conduction delay Nonspecific T abnormalities, lateral leads No acute changes Artifact Confirmed by Derwood Kaplan [phone]) on 03/21/2021 4:02:33 PM  Radiology No results found.  Procedures .Critical Care Performed by: Derwood Kaplan, MD Authorized by: Derwood Kaplan, MD   Critical care provider statement:    Critical care time (minutes):  80   Critical care was necessary to treat or prevent imminent or life-threatening deterioration of the following conditions:  Dehydration and endocrine crisis   Critical care was time spent personally by me on the following activities:  Discussions with consultants, evaluation of  patient's response to treatment, examination of patient, ordering and performing treatments and interventions, ordering and review of laboratory studies, ordering and review of radiographic studies, pulse oximetry, re-evaluation of patient's condition, obtaining history from patient or surrogate and review of old charts   Medications Ordered in ED Medications  lactated ringers bolus 1,000 mL (has no administration in time range)  insulin regular, human (MYXREDLIN) 100 units/ 100 mL infusion (has no administration in time range)  lactated ringers infusion (has no administration in time range)  dextrose 5 % in lactated ringers infusion (has no administration in time range)  dextrose 50 % solution 0-50 mL (has no administration in time range)  lactated ringers bolus 20 mL/kg (has no administration in time range)  potassium chloride 10 mEq in 100 mL IVPB (has no administration in time range)  influenza vac split quadrivalent PF (FLUARIX) injection 0.5 mL (has no administration in time range)  sodium chloride 0.9 % bolus 1,000 mL (1,000 mLs Intravenous New Bag/Given 03/21/21 1533)    ED Course  I have reviewed the triage vital signs and the nursing notes.  Pertinent labs & imaging results that were available during my care of the patient were reviewed by me and considered in my medical decision making (see chart for details).    MDM Rules/Calculators/A&P                            60 year old comes in with chief complaint of weakness.  It appears that he is " failure to thrive" essentially.  Patient has history of diabetes, pancreatic insufficiency and has been rationing his medications.  Over the last 3 months he has had significant weight loss along with inability to now ambulate.  Initial concerns are for renal failure and significant electrolyte derangement.  5:36 PM Patient's lab work-up reveals bicarb less than 10.  He has anion gap of 20 and blood sugar over 300.  He has what seems  like metabolic acidosis, likely combination of DKA and starvation.  Will start insulin drip. Will order more fluids.  Patient is stable for admission.  Results discussed with the patient.  Social work also engaged.    Final Clinical Impression(s) / ED Diagnoses Final diagnoses:  Diabetic ketoacidosis without coma associated with type 2 diabetes mellitus (HCC)  Failure to thrive in adult    Rx / DC Orders ED Discharge Orders     None        Derwood Kaplan, MD 03/21/21 1737

## 2021-03-21 NOTE — ED Provider Notes (Signed)
Emergency Medicine Provider Triage Evaluation Note  Allen Russell , a 60 y.o. male  was evaluated in triage.  Pt complains of generalized weakness, fatigue, shortness of breath with exertion for the last 3 months.  Patient reports that he has noticed a gradual decline in his ability to work out, get normal activities done.  He endorses that he has to stop and rest after 5-10 steps, reports he does not feel that he cannot move enough air, stating that just feels that he does not have the energy.  Patient has not been seen by his PCP or any other medical provider prior to today with these new symptoms.  Patient also endorses weight loss of about 50 pounds in the last 3 months, is now noticing some insomnia.  Patient does endorse diabetes and says that his sugars have not been under tight control, he is not taking his metformin as prescribed.  He had some rectal bleeding last 2 days, does complain of constipation over the last week.  Review of Systems  Positive: Fatigue, weight loss, rectal bleeding, insomnia Negative: Chest pain, abdominal pain, nausea, vomiting  Physical Exam  BP 100/79   Pulse 75   Temp 98.5 F (36.9 C) (Rectal)   Resp 18   SpO2 100%  Gen:   Awake, no distress, pale without diaphoresis Resp:  Normal effort  MSK:   Moves extremities without difficulty Other:  Minimal tenderness to palpation across abdomen  Medical Decision Making  Medically screening exam initiated at 12:29 PM.  Appropriate orders placed.  Quinzell L Maziarz was informed that the remainder of the evaluation will be completed by another provider, this initial triage assessment does not replace that evaluation, and the importance of remaining in the ED until their evaluation is complete.  Fatigue, weight loss, rectal bleeding   [place] 03/21/21 1232    Mancel Bale, MD 03/21/21 1732

## 2021-03-21 NOTE — Progress Notes (Signed)
Patient will possibly need assistance with establishing PCP, assistance with medications, and home health services. Patient is not insured. Unsure if patient has the funds to pay out of pocket. Lap results are pending.

## 2021-03-22 LAB — BASIC METABOLIC PANEL
Anion gap: 9 (ref 5–15)
Anion gap: 8 (ref 5–15)
Anion gap: 10 (ref 5–15)
Anion gap: 8 (ref 5–15)
Anion gap: 9 (ref 5–15)
Anion gap: 5 (ref 5–15)
BUN: 18 mg/dL (ref 6–20)
BUN: 17 mg/dL (ref 6–20)
BUN: 16 mg/dL (ref 6–20)
BUN: 14 mg/dL (ref 6–20)
BUN: 12 mg/dL (ref 6–20)
BUN: 12 mg/dL (ref 6–20)
CO2: 16 mmol/L — ABNORMAL LOW (ref 22–32)
CO2: 19 mmol/L — ABNORMAL LOW (ref 22–32)
CO2: 15 mmol/L — ABNORMAL LOW (ref 22–32)
CO2: 15 mmol/L — ABNORMAL LOW (ref 22–32)
CO2: 16 mmol/L — ABNORMAL LOW (ref 22–32)
CO2: 17 mmol/L — ABNORMAL LOW (ref 22–32)
Calcium: 8.7 mg/dL — ABNORMAL LOW (ref 8.9–10.3)
Calcium: 8.5 mg/dL — ABNORMAL LOW (ref 8.9–10.3)
Calcium: 8.9 mg/dL (ref 8.9–10.3)
Calcium: 8.6 mg/dL — ABNORMAL LOW (ref 8.9–10.3)
Calcium: 8.2 mg/dL — ABNORMAL LOW (ref 8.9–10.3)
Calcium: 8.3 mg/dL — ABNORMAL LOW (ref 8.9–10.3)
Chloride: 112 mmol/L — ABNORMAL HIGH (ref 98–111)
Chloride: 111 mmol/L (ref 98–111)
Chloride: 114 mmol/L — ABNORMAL HIGH (ref 98–111)
Chloride: 115 mmol/L — ABNORMAL HIGH (ref 98–111)
Chloride: 111 mmol/L (ref 98–111)
Chloride: 114 mmol/L — ABNORMAL HIGH (ref 98–111)
Creatinine, Ser: 0.91 mg/dL (ref 0.61–1.24)
Creatinine, Ser: 0.84 mg/dL (ref 0.61–1.24)
Creatinine, Ser: 0.95 mg/dL (ref 0.61–1.24)
Creatinine, Ser: 0.9 mg/dL (ref 0.61–1.24)
Creatinine, Ser: 0.86 mg/dL (ref 0.61–1.24)
Creatinine, Ser: 0.87 mg/dL (ref 0.61–1.24)
GFR, Estimated: >60 mL/min (ref >60)
GFR, Estimated: >60 mL/min (ref >60)
GFR, Estimated: >60 mL/min (ref >60)
GFR, Estimated: >60 mL/min (ref >60)
GFR, Estimated: >60 mL/min (ref >60)
GFR, Estimated: >60 mL/min (ref >60)
Glucose, Bld: 141 mg/dL — ABNORMAL HIGH (ref 70–99)
Glucose, Bld: 112 mg/dL — ABNORMAL HIGH (ref 70–99)
Glucose, Bld: 184 mg/dL — ABNORMAL HIGH (ref 70–99)
Glucose, Bld: 148 mg/dL — ABNORMAL HIGH (ref 70–99)
Glucose, Bld: 206 mg/dL — ABNORMAL HIGH (ref 70–99)
Glucose, Bld: 223 mg/dL — ABNORMAL HIGH (ref 70–99)
Potassium: 3.1 mmol/L — ABNORMAL LOW (ref 3.5–5.1)
Potassium: 2.8 mmol/L — ABNORMAL LOW (ref 3.5–5.1)
Potassium: 3 mmol/L — ABNORMAL LOW (ref 3.5–5.1)
Potassium: 3.2 mmol/L — ABNORMAL LOW (ref 3.5–5.1)
Potassium: 3.2 mmol/L — ABNORMAL LOW (ref 3.5–5.1)
Potassium: 2.9 mmol/L — ABNORMAL LOW (ref 3.5–5.1)
Sodium: 137 mmol/L (ref 135–145)
Sodium: 138 mmol/L (ref 135–145)
Sodium: 139 mmol/L (ref 135–145)
Sodium: 138 mmol/L (ref 135–145)
Sodium: 136 mmol/L (ref 135–145)
Sodium: 136 mmol/L (ref 135–145)

## 2021-03-22 LAB — BETA-HYDROXYBUTYRIC ACID
Beta-Hydroxybutyric Acid: 3.32 mmol/L — ABNORMAL HIGH (ref 0.05–0.27)
Beta-Hydroxybutyric Acid: 4.28 mmol/L — ABNORMAL HIGH (ref 0.05–0.27)
Beta-Hydroxybutyric Acid: 2.06 mmol/L — ABNORMAL HIGH (ref 0.05–0.27)

## 2021-03-22 LAB — GLUCOSE, CAPILLARY
Glucose-Capillary: 130 mg/dL — ABNORMAL HIGH (ref 70–99)
Glucose-Capillary: 153 mg/dL — ABNORMAL HIGH (ref 70–99)
Glucose-Capillary: 148 mg/dL — ABNORMAL HIGH (ref 70–99)
Glucose-Capillary: 136 mg/dL — ABNORMAL HIGH (ref 70–99)
Glucose-Capillary: 142 mg/dL — ABNORMAL HIGH (ref 70–99)
Glucose-Capillary: 146 mg/dL — ABNORMAL HIGH (ref 70–99)
Glucose-Capillary: 122 mg/dL — ABNORMAL HIGH (ref 70–99)
Glucose-Capillary: 118 mg/dL — ABNORMAL HIGH (ref 70–99)
Glucose-Capillary: 94 mg/dL (ref 70–99)
Glucose-Capillary: 140 mg/dL — ABNORMAL HIGH (ref 70–99)
Glucose-Capillary: 183 mg/dL — ABNORMAL HIGH (ref 70–99)
Glucose-Capillary: 227 mg/dL — ABNORMAL HIGH (ref 70–99)
Glucose-Capillary: 204 mg/dL — ABNORMAL HIGH (ref 70–99)
Glucose-Capillary: 153 mg/dL — ABNORMAL HIGH (ref 70–99)
Glucose-Capillary: 145 mg/dL — ABNORMAL HIGH (ref 70–99)
Glucose-Capillary: 181 mg/dL — ABNORMAL HIGH (ref 70–99)
Glucose-Capillary: 155 mg/dL — ABNORMAL HIGH (ref 70–99)
Glucose-Capillary: 124 mg/dL — ABNORMAL HIGH (ref 70–99)
Glucose-Capillary: 169 mg/dL — ABNORMAL HIGH (ref 70–99)
Glucose-Capillary: 220 mg/dL — ABNORMAL HIGH (ref 70–99)
Glucose-Capillary: 261 mg/dL — ABNORMAL HIGH (ref 70–99)
Glucose-Capillary: 224 mg/dL — ABNORMAL HIGH (ref 70–99)
Glucose-Capillary: 155 mg/dL — ABNORMAL HIGH (ref 70–99)
Glucose-Capillary: 189 mg/dL — ABNORMAL HIGH (ref 70–99)

## 2021-03-22 LAB — BLOOD GAS, ARTERIAL
Acid-base deficit: 11.1 mmol/L — ABNORMAL HIGH (ref 0.0–2.0)
Bicarbonate: 13.1 mmol/L — ABNORMAL LOW (ref 20.0–28.0)
Drawn by: 23281
FIO2: 21
O2 Saturation: 96.4 %
Patient temperature: 97.5
pCO2 arterial: 25.3 mmHg — ABNORMAL LOW (ref 32.0–48.0)
pH, Arterial: 7.334 — ABNORMAL LOW (ref 7.350–7.450)
pO2, Arterial: 113 mmHg — ABNORMAL HIGH (ref 83.0–108.0)

## 2021-03-22 LAB — CBC
HCT: 33.3 % — ABNORMAL LOW (ref 39.0–52.0)
Hemoglobin: 12.5 g/dL — ABNORMAL LOW (ref 13.0–17.0)
MCH: 31.6 pg (ref 26.0–34.0)
MCHC: 37.5 g/dL — ABNORMAL HIGH (ref 30.0–36.0)
MCV: 84.3 fL (ref 80.0–100.0)
Platelets: 150 10*3/uL (ref 150–400)
RBC: 3.95 MIL/uL — ABNORMAL LOW (ref 4.22–5.81)
RDW: 15.8 % — ABNORMAL HIGH (ref 11.5–15.5)
WBC: 5 10*3/uL (ref 4.0–10.5)
nRBC: 0 % (ref 0.0–0.2)

## 2021-03-22 LAB — MAGNESIUM
Magnesium: 2.3 mg/dL (ref 1.7–2.4)
Magnesium: 2 mg/dL (ref 1.7–2.4)

## 2021-03-22 LAB — HIV ANTIBODY (ROUTINE TESTING W REFLEX): HIV Screen 4th Generation wRfx: NONREACTIVE

## 2021-03-22 LAB — HEMOGLOBIN A1C
Hgb A1c MFr Bld: 11.4 % — ABNORMAL HIGH (ref 4.8–5.6)
Mean Plasma Glucose: 280.48 mg/dL

## 2021-03-22 LAB — OSMOLALITY: Osmolality: 299 mOsm/kg — ABNORMAL HIGH (ref 275–295)

## 2021-03-22 LAB — SARS CORONAVIRUS 2 (TAT 6-24 HRS): SARS Coronavirus 2: NEGATIVE

## 2021-03-22 MED ORDER — SODIUM CHLORIDE 0.9 % IV BOLUS
500.0000 mL | Freq: Once | INTRAVENOUS | Status: AC
  Administered 2021-03-22: 500 mL via INTRAVENOUS

## 2021-03-22 MED ORDER — POTASSIUM CHLORIDE CRYS ER 20 MEQ PO TBCR
40.0000 meq | EXTENDED_RELEASE_TABLET | Freq: Once | ORAL | Status: AC
  Administered 2021-03-22: 40 meq via ORAL
  Filled 2021-03-22: qty 2

## 2021-03-22 MED ORDER — MIDODRINE HCL 5 MG PO TABS
10.0000 mg | ORAL_TABLET | Freq: Three times a day (TID) | ORAL | Status: DC
  Administered 2021-03-22 – 2021-03-25 (×8): 10 mg via ORAL
  Filled 2021-03-22 (×8): qty 2

## 2021-03-22 MED ORDER — NOREPINEPHRINE 4 MG/250ML-% IV SOLN
2.0000 ug/min | INTRAVENOUS | Status: DC
  Administered 2021-03-22: 2 ug/min via INTRAVENOUS
  Administered 2021-03-23: 6 ug/min via INTRAVENOUS
  Administered 2021-03-23: 8 ug/min via INTRAVENOUS
  Administered 2021-03-24: 7 ug/min via INTRAVENOUS
  Filled 2021-03-22 (×4): qty 250

## 2021-03-22 MED ORDER — PANCRELIPASE (LIP-PROT-AMYL) 12000-38000 UNITS PO CPEP
36000.0000 [IU] | ORAL_CAPSULE | Freq: Three times a day (TID) | ORAL | Status: DC
  Administered 2021-03-22 – 2021-04-07 (×48): 36000 [IU] via ORAL
  Filled 2021-03-22 (×3): qty 1
  Filled 2021-03-22: qty 3
  Filled 2021-03-22 (×4): qty 1
  Filled 2021-03-22 (×2): qty 3
  Filled 2021-03-22 (×4): qty 1
  Filled 2021-03-22: qty 3
  Filled 2021-03-22 (×6): qty 1
  Filled 2021-03-22: qty 3
  Filled 2021-03-22 (×8): qty 1
  Filled 2021-03-22: qty 3
  Filled 2021-03-22 (×13): qty 1
  Filled 2021-03-22: qty 3
  Filled 2021-03-22 (×7): qty 1

## 2021-03-22 MED ORDER — POTASSIUM CHLORIDE 10 MEQ/100ML IV SOLN
10.0000 meq | INTRAVENOUS | Status: AC
Start: 2021-03-22 — End: 2021-03-22
  Administered 2021-03-22 (×6): 10 meq via INTRAVENOUS
  Filled 2021-03-22 (×6): qty 100

## 2021-03-22 MED ORDER — PANCRELIPASE (LIP-PROT-AMYL) 12000-38000 UNITS PO CPEP
36000.0000 [IU] | ORAL_CAPSULE | Freq: Three times a day (TID) | ORAL | Status: DC | PRN
  Administered 2021-03-29: 36000 [IU] via ORAL
  Filled 2021-03-22 (×4): qty 1

## 2021-03-22 MED ORDER — SODIUM CHLORIDE 0.9 % IV SOLN
250.0000 mL | INTRAVENOUS | Status: DC
  Administered 2021-03-22 – 2021-04-05 (×4): 250 mL via INTRAVENOUS

## 2021-03-22 MED ORDER — SODIUM CHLORIDE 0.9 % IV BOLUS
1000.0000 mL | Freq: Once | INTRAVENOUS | Status: AC
  Administered 2021-03-22: 1000 mL via INTRAVENOUS

## 2021-03-22 MED ORDER — ORAL CARE MOUTH RINSE
15.0000 mL | Freq: Two times a day (BID) | OROMUCOSAL | Status: DC
  Administered 2021-03-22 – 2021-03-29 (×13): 15 mL via OROMUCOSAL

## 2021-03-22 NOTE — Progress Notes (Signed)
Patients bp's continue to run soft. Patient has received around 2L of fluid since admission. MD was made aware of patients current bp of 90/52. MD wants to continue d5LR @ 125.

## 2021-03-22 NOTE — Progress Notes (Signed)
PROGRESS NOTE    Allen Russell  HFW:[id number] DOB: May 10, 1961 DOA: 03/21/2021 PCP: Steve Rattler, PA-C  1230/1230-01   Assessment & Plan:   Active Problems:   Ketoacidosis   Allen Russell is a 60 y.o. 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 EGD 03/29/18 with removal of Axios stent), L2-L3 posterior spinal fusion surgery, who presented with weight loss, weakness, and dyspnea on exertion, and found to be in DKA.   # DKA, POA --BG 363, gap 20, bicarb 9, Beta-hydroxybutyric >8, ABG pH 7.19, pCO2 <19.   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  --Received IVF boluses and started on insulin gtt per EndoTool --gap closed today, but bicarb still low and beta-hydroxybutyric still high at 4.28. Plan: --cont insulin gtt per Endo[employer] until bicarb normalizes, then transition to glargine with 2 hours of overlap. --BMP q8h to monitor bicarb --cont MIVF@125  --allow diet now   # Hypokalemia --BMP q8h  --monitor and replete with oral potassium   # AKI, POA --Cr 1.35 on presentation (baseline ~1), likely due to dehydration.  Cr improved with IVF.   # Orthostasis due to dehydration # Hypotension --In the ED, BP dropped from systolic 92 sitting to 78 standing up.  HR went from 73 sitting to 94 standing up. --BP continued to be soft 80's-90's systolic even after large amount of IVF. Plan: --cont MIVF as above --IVF boluses if systolic <90's   # Weight loss 2/2 starvation --could be both from reduced calorie intake and possible DM1 status. --nutrition consult  # Hx of alcohol abuse  # Hx of ETOH pancreatitis  complicated by prior pancreatic necrosis  # Pancreatic insufficiency  --Pt denied current alcohol use --start Creon TID with meals   DVT prophylaxis: Lovenox SQ Code Status: Full code  Family Communication: mother updated at bedside today Level of care: Stepdown Dispo:   The patient is from: home Anticipated d/c is to: home Anticipated d/c date is: 2-3 days Patient currently is not medically ready to d/c due to: still in DKA on insulin gtt   Subjective and Interval History:  Pt reported feeling a bit better.  No dyspnea.  Good urine output.  Able to tolerate oral intake.    BP has been soft even after tons of IVF.   Objective: Vitals:   03/22/21 1400 03/22/21 1500 03/22/21 1600 03/22/21 1700  BP: (!) 88/45 (!) 83/44 (!) 81/57 (!) 83/43  Pulse: 64 67 67 (!) 54  Resp: 13 14 14 18   Temp:   98.2 F (36.8 C)   TempSrc:   Oral   SpO2: 96% 99% 100% 100%  Weight:      Height:        Intake/Output Summary (Last 24 hours) at 03/22/2021 1811 Last data filed at 03/22/2021 1600 Gross per 24 hour  Intake 5624.85 ml  Output 3100 ml  Net 2524.85 ml   Filed Weights   03/21/21 1853 03/21/21 2136  Weight: 74.4 kg 78.7 kg    Examination:   Constitutional: NAD, AAOx3 HEENT: conjunctivae and lids normal, EOMI CV: No cyanosis.   RESP: normal respiratory effort, normal RR, on RA Extremities: No effusions, edema in BLE SKIN: warm, dry Neuro: II -  XII grossly intact.   Psych: depressed mood and affect.     Data Reviewed: I have personally reviewed following labs and imaging studies  CBC: Recent Labs  Lab 03/21/21 1551 03/22/21 0242  WBC 5.0 5.0  NEUTROABS 3.3  --   HGB 14.7 12.5*  HCT 41.8 33.3*  MCV 87.8 84.3  PLT 202 150   Basic Metabolic Panel: Recent Labs  Lab 03/21/21 1551 03/21/21 2237 03/21/21 2241 03/22/21 0242 03/22/21 0612 03/22/21 1037 03/22/21 1424  NA 135 135  --  137 138 139 138  K 3.2* 3.0*  --  3.1* 2.8* 3.0* 3.2*  CL 106 110  --  112* 111  114* 115*  CO2 9* 13*  --  16* 19* 15* 15*  GLUCOSE 363* 148*  --  141* 112* 184* 148*  BUN 24* 21*  --  18 17 16 14   CREATININE 1.35* 1.06  --  0.91 0.84 0.95 0.90  CALCIUM 9.4 9.0  --  8.7* 8.5* 8.9 8.6*  MG 2.2  --  2.3 2.0  --   --   --   PHOS 3.4  --   --   --   --   --   --    GFR: Estimated Creatinine Clearance: 100.8 mL/min (by C-G formula based on SCr of 0.9 mg/dL). Liver Function Tests: Recent Labs  Lab 03/21/21 1551  AST 17  ALT 22  ALKPHOS 95  BILITOT 2.1*  PROT 7.4  ALBUMIN 4.4   No results for input(s): LIPASE, AMYLASE in the last 168 hours. No results for input(s): AMMONIA in the last 168 hours. Coagulation Profile: Recent Labs  Lab 03/21/21 1551  INR 1.1   Cardiac Enzymes: Recent Labs  Lab 03/21/21 1551  CKTOTAL 104   BNP (last 3 results) No results for input(s): PROBNP in the last 8760 hours. HbA1C: Recent Labs    03/21/21 2237  HGBA1C 11.4*   CBG: Recent Labs  Lab 03/22/21 1318 03/22/21 1423 03/22/21 1522 03/22/21 1624 03/22/21 1730  GLUCAP 153* 145* 181* 155* 124*   Lipid Profile: No results for input(s): CHOL, HDL, LDLCALC, TRIG, CHOLHDL, LDLDIRECT in the last 72 hours. Thyroid Function Tests: No results for input(s): TSH, T4TOTAL, FREET4, T3FREE, THYROIDAB in the last 72 hours. Anemia Panel: No results for input(s): VITAMINB12, FOLATE, FERRITIN, TIBC, IRON, RETICCTPCT in the last 72 hours. Sepsis Labs: No results for input(s): PROCALCITON, LATICACIDVEN in the last 168 hours.  Recent Results (from the past 240 hour(s))  SARS CORONAVIRUS 2 (TAT 6-24 HRS) Nasopharyngeal Nasopharyngeal Swab     Status: None   Collection Time: 03/21/21  6:01 PM   Specimen: Nasopharyngeal Swab  Result Value Ref Range Status   SARS Coronavirus 2 NEGATIVE NEGATIVE Final    Comment: (NOTE) SARS-CoV-2 target nucleic acids are NOT DETECTED.  The SARS-CoV-2 RNA is generally detectable in upper and lower respiratory specimens during the acute phase of  infection. Negative results do not preclude SARS-CoV-2 infection, do not rule out co-infections with other pathogens, and should not be used as the sole basis for treatment or other patient management decisions. Negative results must be combined with clinical observations, patient history, and epidemiological information. The expected result is Negative.  Fact Sheet for Patients: 03/23/21  Fact Sheet for Healthcare Providers: HairSlick.no  This test is not yet approved or cleared by the quierodirigir.com FDA and  has been authorized for detection and/or diagnosis of SARS-CoV-2 by FDA under an Emergency Use Authorization (EUA). This EUA will  remain  in effect (meaning this test can be used) for the duration of the COVID-19 declaration under Se ction 564(b)(1) of the Act, 21 U.S.C. section 360bbb-3(b)(1), unless the authorization is terminated or revoked sooner.  Performed at [facility], [address] [place], [place] [address]   MRSA Next Gen by PCR, Nasal     Status: None   Collection Time: 03/21/21  9:37 PM   Specimen: Nasal Mucosa; Nasal Swab  Result Value Ref Range Status   MRSA by PCR Next Gen NOT DETECTED NOT DETECTED Final    Comment: (NOTE) The GeneXpert MRSA Assay (FDA approved for NASAL specimens only), is one component of a comprehensive MRSA colonization surveillance program. It is not intended to diagnose MRSA infection nor to guide or monitor treatment for MRSA infections. Test performance is not FDA approved in patients less than 76 years old. Performed at [facility], [address], [place], [place] [address]       Radiology Studies: No results found.   Scheduled Meds:  Chlorhexidine Gluconate Cloth  6 each Topical Daily   enoxaparin (LOVENOX) injection  40 mg Subcutaneous Q24H   influenza vac split quadrivalent PF  0.5 mL Intramuscular Tomorrow-1000    lipase/protease/amylase  36,000 Units Oral TID WC   mouth rinse  15 mL Mouth Rinse BID   Continuous Infusions:  dextrose 5% lactated ringers 125 mL/hr at 03/22/21 1600   insulin 2 Units/hr (03/22/21 1600)   lactated ringers Stopped (03/21/21 2214)     LOS: 1 day     Darlin Priestly, MD [facility] If 7PM-7AM, please contact night-coverage 03/22/2021, 6:11 PM

## 2021-03-22 NOTE — Progress Notes (Signed)
Patients bp remains soft. Bp at 1400 was 88/45. Bp was taken manually and was 92/50. MD was made aware. MD says pts BP is ok for now.

## 2021-03-22 NOTE — TOC Initial Note (Signed)
Transition of Care [facility]) - Initial/Assessment Note    Patient Details  Name: Allen Russell MRN: [id number] Date of Birth: [birth year]  Transition of Care [facility]) CM/SW Contact:    Golda Acre, RN Phone Number: 03/22/2021, 8:56 AM  Clinical Narrative:                 60 y.o. 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 EGD 03/29/18 with removal of Axios stent), L2-L3 posterior spinal fusion surgery, who presented with weight loss, weakness, and dyspnea on exertion.   Pt reported weight loss of about 50 lbs in the past 2-3 months, although he admitted to financial difficulties and apparently couldn't afford foods and medications.  Pt reported drinking only 2 protein drinks on most days.  Pt feels thirsty and drinks a lot of water, with normal amount of urine output.  Pt also complained of more weakness, fatigue, dyspnea with minimum exertion in the past 2-3 months.     Pt said his diabetes was well controlled previously with good diet and exercises, until he lost his activity tolerance.  Pt only took metformin.  Pt reported pancreatic deficiency for which he had to take pancreatic enzymes.  Pt denied recent alcohol use.  No abdominal pain.  Pt reported being constipated and noted blood on the wipe after passing hard stools.   ED Course: initial vitals: afebrile, HR 75, BP 100/79, sating 100% on room air at rest.  Labs notable for K+ 3.2, BG 363, gap 20, bicarb 9, Cr 1.35 (baseline ~1), BUN 24, ABG pH 7.19, pCO2 <19.  Pt received 2L bolus f/b MIVF, and started on insulin gtt per Endo[employer].    TOC PLAN OF CARE: Lives alone, will need resources from the diabetic coordinator. Plan is to return to home with  self care.  Expected Discharge Plan: Home/Self Care Barriers to Discharge: Continued Medical Work up   Patient Goals and CMS Choice Patient states their goals for this hospitalization and ongoing recovery are:: to gio back home CMS Medicare.gov Compare Post Acute Care list provided to:: Patient    Expected Discharge Plan and Services Expected Discharge Plan: Home/Self Care   Discharge Planning Services: CM Consult   Living arrangements for the past 2 months: Single Family Home                                      Prior Living Arrangements/Services Living arrangements for the past 2 months: Single Family Home Lives with:: Self Patient language and need for interpreter reviewed:: Yes Do you feel safe going back to the place where you live?: Yes            Criminal Activity/Legal Involvement Pertinent to Current Situation/Hospitalization: No - Comment as needed  Activities of Daily Living Home Assistive Devices/Equipment: Eyeglasses ADL Screening (condition at time of admission) Patient's cognitive ability adequate to safely complete daily activities?: Yes Is the patient deaf or have difficulty hearing?: No Does the patient have difficulty seeing, even when wearing glasses/contacts?: No Does the patient have difficulty concentrating, remembering, or making decisions?: Yes (some trouble concentrating) Patient able to express need for assistance with ADLs?: Yes Does the patient have difficulty dressing or bathing?:  Yes Independently performs ADLs?: No Communication: Independent Dressing (OT): Needs assistance Is this a change from baseline?: Pre-admission baseline Grooming: Independent Feeding: Independent Bathing: Needs assistance Is this a change from baseline?: Pre-admission baseline Toileting: Needs assistance Is this a change from baseline?: Pre-admission baseline In/Out Bed: Needs assistance Is this a change from baseline?: Pre-admission baseline Walks in  Home: Needs assistance Is this a change from baseline?: Pre-admission baseline Does the patient have difficulty walking or climbing stairs?: Yes Weakness of Legs: Both Weakness of Arms/Hands: Both  Permission Sought/Granted                  Emotional Assessment Appearance:: Appears stated age     Orientation: : Oriented to Self, Oriented to Place, Oriented to  Time, Oriented to Situation Alcohol / Substance Use: Alcohol Use, Tobacco Use Psych Involvement: No (comment)  Admission diagnosis:  Ketoacidosis [E87.2] Failure to thrive in adult [R62.7] Diabetic ketoacidosis without coma associated with type 2 diabetes mellitus (HCC) [E11.10] Patient Active Problem List   Diagnosis Date Noted   Ketoacidosis 03/21/2021   Postlaminectomy syndrome, lumbar region 09/11/2017   Spondylosis without myelopathy or radiculopathy, lumbar region 09/11/2017   PCP:  Missy Sabins Pharmacy:   [id number] - [place] ([place]), [place] - [address] [place] ([place]) [place] [address] Phone: [phone] Fax: [phone]     Social Determinants of Health (SDOH) Interventions    Readmission Risk Interventions No flowsheet data found.

## 2021-03-23 ENCOUNTER — Inpatient Hospital Stay (HOSPITAL_COMMUNITY): Payer: Self-pay

## 2021-03-23 DIAGNOSIS — R571 Hypovolemic shock: Secondary | ICD-10-CM

## 2021-03-23 DIAGNOSIS — R579 Shock, unspecified: Secondary | ICD-10-CM

## 2021-03-23 DIAGNOSIS — E1110 Type 2 diabetes mellitus with ketoacidosis without coma: Principal | ICD-10-CM

## 2021-03-23 LAB — BASIC METABOLIC PANEL
Anion gap: 9 (ref 5–15)
Anion gap: 4 — ABNORMAL LOW (ref 5–15)
BUN: 9 mg/dL (ref 6–20)
BUN: 9 mg/dL (ref 6–20)
CO2: 19 mmol/L — ABNORMAL LOW (ref 22–32)
CO2: 21 mmol/L — ABNORMAL LOW (ref 22–32)
Calcium: 8.2 mg/dL — ABNORMAL LOW (ref 8.9–10.3)
Calcium: 8.1 mg/dL — ABNORMAL LOW (ref 8.9–10.3)
Chloride: 110 mmol/L (ref 98–111)
Chloride: 111 mmol/L (ref 98–111)
Creatinine, Ser: 0.72 mg/dL (ref 0.61–1.24)
Creatinine, Ser: 0.75 mg/dL (ref 0.61–1.24)
GFR, Estimated: >60 mL/min (ref >60)
GFR, Estimated: >60 mL/min (ref >60)
Glucose, Bld: 180 mg/dL — ABNORMAL HIGH (ref 70–99)
Glucose, Bld: 163 mg/dL — ABNORMAL HIGH (ref 70–99)
Potassium: 2.9 mmol/L — ABNORMAL LOW (ref 3.5–5.1)
Potassium: 2.8 mmol/L — ABNORMAL LOW (ref 3.5–5.1)
Sodium: 138 mmol/L (ref 135–145)
Sodium: 136 mmol/L (ref 135–145)

## 2021-03-23 LAB — GLUCOSE, CAPILLARY
Glucose-Capillary: 152 mg/dL — ABNORMAL HIGH (ref 70–99)
Glucose-Capillary: 155 mg/dL — ABNORMAL HIGH (ref 70–99)
Glucose-Capillary: 158 mg/dL — ABNORMAL HIGH (ref 70–99)
Glucose-Capillary: 163 mg/dL — ABNORMAL HIGH (ref 70–99)
Glucose-Capillary: 164 mg/dL — ABNORMAL HIGH (ref 70–99)
Glucose-Capillary: 167 mg/dL — ABNORMAL HIGH (ref 70–99)
Glucose-Capillary: 173 mg/dL — ABNORMAL HIGH (ref 70–99)
Glucose-Capillary: 182 mg/dL — ABNORMAL HIGH (ref 70–99)
Glucose-Capillary: 185 mg/dL — ABNORMAL HIGH (ref 70–99)
Glucose-Capillary: 200 mg/dL — ABNORMAL HIGH (ref 70–99)
Glucose-Capillary: 218 mg/dL — ABNORMAL HIGH (ref 70–99)
Glucose-Capillary: 253 mg/dL — ABNORMAL HIGH (ref 70–99)
Glucose-Capillary: 288 mg/dL — ABNORMAL HIGH (ref 70–99)

## 2021-03-23 LAB — CBC
HCT: 30.9 % — ABNORMAL LOW (ref 39.0–52.0)
Hemoglobin: 11.6 g/dL — ABNORMAL LOW (ref 13.0–17.0)
MCH: 31.4 pg (ref 26.0–34.0)
MCHC: 37.5 g/dL — ABNORMAL HIGH (ref 30.0–36.0)
MCV: 83.5 fL (ref 80.0–100.0)
Platelets: 189 10*3/uL (ref 150–400)
RBC: 3.7 MIL/uL — ABNORMAL LOW (ref 4.22–5.81)
RDW: 15.8 % — ABNORMAL HIGH (ref 11.5–15.5)
WBC: 4.7 10*3/uL (ref 4.0–10.5)
nRBC: 0 % (ref 0.0–0.2)

## 2021-03-23 LAB — LACTIC ACID, PLASMA
Lactic Acid, Venous: 1.2 mmol/L (ref 0.5–1.9)
Lactic Acid, Venous: 1.1 mmol/L (ref 0.5–1.9)

## 2021-03-23 LAB — MAGNESIUM: Magnesium: 1.7 mg/dL (ref 1.7–2.4)

## 2021-03-23 LAB — BETA-HYDROXYBUTYRIC ACID: Beta-Hydroxybutyric Acid: 0.22 mmol/L (ref 0.05–0.27)

## 2021-03-23 LAB — CORTISOL: Cortisol, Plasma: 12.1 ug/dL

## 2021-03-23 MED ORDER — SODIUM CHLORIDE 0.9 % IV BOLUS
500.0000 mL | Freq: Once | INTRAVENOUS | Status: AC
  Administered 2021-03-23: 500 mL via INTRAVENOUS

## 2021-03-23 MED ORDER — INSULIN GLARGINE-YFGN 100 UNIT/ML ~~LOC~~ SOLN
20.0000 [IU] | Freq: Every day | SUBCUTANEOUS | Status: DC
  Administered 2021-03-23 – 2021-03-25 (×3): 20 [IU] via SUBCUTANEOUS
  Filled 2021-03-23 (×3): qty 0.2

## 2021-03-23 MED ORDER — POTASSIUM CHLORIDE IN NACL 40-0.9 MEQ/L-% IV SOLN
INTRAVENOUS | Status: DC
  Filled 2021-03-23 (×2): qty 1000

## 2021-03-23 MED ORDER — PROSOURCE PLUS PO LIQD
30.0000 mL | Freq: Two times a day (BID) | ORAL | Status: DC
  Administered 2021-03-24 – 2021-04-07 (×28): 30 mL via ORAL
  Filled 2021-03-23 (×29): qty 30

## 2021-03-23 MED ORDER — INSULIN ASPART 100 UNIT/ML IJ SOLN
0.0000 [IU] | Freq: Every day | INTRAMUSCULAR | Status: DC
  Administered 2021-03-23: 3 [IU] via SUBCUTANEOUS
  Administered 2021-03-24 – 2021-03-26 (×3): 4 [IU] via SUBCUTANEOUS
  Administered 2021-03-27 – 2021-03-31 (×3): 2 [IU] via SUBCUTANEOUS
  Administered 2021-04-01: 3 [IU] via SUBCUTANEOUS
  Administered 2021-04-02 – 2021-04-06 (×3): 2 [IU] via SUBCUTANEOUS

## 2021-03-23 MED ORDER — GLUCERNA SHAKE PO LIQD
237.0000 mL | Freq: Two times a day (BID) | ORAL | Status: DC
  Administered 2021-03-23 – 2021-04-07 (×26): 237 mL via ORAL
  Filled 2021-03-23 (×32): qty 237

## 2021-03-23 MED ORDER — POTASSIUM CHLORIDE CRYS ER 20 MEQ PO TBCR
40.0000 meq | EXTENDED_RELEASE_TABLET | ORAL | Status: AC
  Administered 2021-03-23 (×2): 40 meq via ORAL
  Filled 2021-03-23 (×2): qty 2

## 2021-03-23 MED ORDER — INSULIN ASPART 100 UNIT/ML IJ SOLN
0.0000 [IU] | Freq: Three times a day (TID) | INTRAMUSCULAR | Status: DC
  Administered 2021-03-23: 8 [IU] via SUBCUTANEOUS
  Administered 2021-03-23: 3 [IU] via SUBCUTANEOUS
  Administered 2021-03-24 (×2): 8 [IU] via SUBCUTANEOUS
  Administered 2021-03-24: 3 [IU] via SUBCUTANEOUS
  Administered 2021-03-25: 15 [IU] via SUBCUTANEOUS
  Administered 2021-03-25: 2 [IU] via SUBCUTANEOUS
  Administered 2021-03-25: 3 [IU] via SUBCUTANEOUS
  Administered 2021-03-26: 8 [IU] via SUBCUTANEOUS
  Administered 2021-03-26: 15 [IU] via SUBCUTANEOUS
  Administered 2021-03-26: 8 [IU] via SUBCUTANEOUS
  Administered 2021-03-27: 5 [IU] via SUBCUTANEOUS
  Administered 2021-03-27: 8 [IU] via SUBCUTANEOUS
  Administered 2021-03-27: 5 [IU] via SUBCUTANEOUS
  Administered 2021-03-28: 3 [IU] via SUBCUTANEOUS
  Administered 2021-03-28: 2 [IU] via SUBCUTANEOUS
  Administered 2021-03-28: 5 [IU] via SUBCUTANEOUS
  Administered 2021-03-29: 3 [IU] via SUBCUTANEOUS
  Administered 2021-03-29 – 2021-03-30 (×3): 2 [IU] via SUBCUTANEOUS
  Administered 2021-03-31: 8 [IU] via SUBCUTANEOUS
  Administered 2021-03-31 – 2021-04-01 (×2): 11 [IU] via SUBCUTANEOUS
  Administered 2021-04-01 (×2): 8 [IU] via SUBCUTANEOUS
  Administered 2021-04-02: 15 [IU] via SUBCUTANEOUS
  Administered 2021-04-02: 5 [IU] via SUBCUTANEOUS
  Administered 2021-04-02: 11 [IU] via SUBCUTANEOUS
  Administered 2021-04-03 (×2): 5 [IU] via SUBCUTANEOUS
  Administered 2021-04-03: 2 [IU] via SUBCUTANEOUS
  Administered 2021-04-04: 11 [IU] via SUBCUTANEOUS
  Administered 2021-04-04: 3 [IU] via SUBCUTANEOUS
  Administered 2021-04-04 – 2021-04-05 (×2): 5 [IU] via SUBCUTANEOUS
  Administered 2021-04-05: 11 [IU] via SUBCUTANEOUS
  Administered 2021-04-05: 3 [IU] via SUBCUTANEOUS
  Administered 2021-04-06 (×2): 5 [IU] via SUBCUTANEOUS
  Administered 2021-04-07: 3 [IU] via SUBCUTANEOUS
  Administered 2021-04-07: 5 [IU] via SUBCUTANEOUS

## 2021-03-23 MED ORDER — LACTATED RINGERS IV BOLUS
500.0000 mL | Freq: Once | INTRAVENOUS | Status: AC
  Administered 2021-03-23: 500 mL via INTRAVENOUS

## 2021-03-23 MED ORDER — ADULT MULTIVITAMIN W/MINERALS CH
1.0000 | ORAL_TABLET | Freq: Every day | ORAL | Status: DC
  Administered 2021-03-23 – 2021-04-07 (×16): 1 via ORAL
  Filled 2021-03-23 (×16): qty 1

## 2021-03-23 NOTE — Progress Notes (Addendum)
Consult to assess line with vasopressor infusing. L AC site tender per pt, on ultrasound assessment, angiocath is visible within the vein. Unclear if bloody drainage under dressing is new. Noted BP cuff was placed above site and AC site is greater than 24 hours old. Medial L forearm site started with recommendation to remove AC site. Existing R AC site tender and swollen. R upper arm super-long PIV placed.  Discussed with RN: She will place IV watch to L forearm site and remove both AC sites.  Pt had questions about PICC line procedure, questions answered. Please consider PICC for prolonged infusions.

## 2021-03-23 NOTE — Consult Note (Signed)
  PCCM Attending Note Please refer also to PCCM consult note from L Caparosa and Kreg Shropshire  Reason for consultation: Asked to see Allen Russell by Dr. Hanley Ben for shock, DKA, unexplained weight loss  60 year old gentleman with history of remote alcohol abuse with associated chronic pancreatitis complicated by pancreatic necrosis, also diabetes, chronic back pain and neuropathy. He has been chronically ill for about 3 months with weakness, decreased activity tolerance, unintended weight loss of over 50 pounds.  Some of this related to inability to afford and take his medications including his metformin and his pancreatic enzymes.  He was brought to the ED 9/19 due to acute on chronic profound weakness and deconditioning superimposed on the above.  His initial evaluation was consistent with shock, metabolic panel consistent with DKA, acute renal insufficiency.  He was treated for DKA and for presumed hypovolemic shock, but remained on norepinephrine infusion even after volume resuscitation and improvement in his metabolic status.  PCCM consulted on 9/21.  Interval: He has been treated with insulin infusion, is just transitioning now to subcutaneous insulin.  He remains on norepinephrine 5:08 liters volume resuscitation.  Lactic acid is normal.  Vitals:   03/23/21 0530 03/23/21 0600 03/23/21 0630 03/23/21 0645  BP: (!) 95/55 (!) 94/56  (!) 98/57  Pulse: (!) 53 (!) 52  (!) 51  Resp: 11 13  13   Temp:   98 F (36.7 C)   TempSrc:   Oral   SpO2: 96% 96%  97%  Weight:      Height:      Chronically ill-appearing with some temporal wasting, but awake, interacting appropriately, oriented and answering questions.  Follows commands.  Oropharynx is moist.  Pupils equal.  Heart is bradycardic but regular with sinus bradycardia on monitor.  Lungs are clear bilaterally.  Abdomen is nondistended with positive bowel sounds.  No significant lower extremity edema.  Undifferentiated shock.  Believe that certainly there  was a component of hypovolemia in the setting of his DKA and chronic hyperglycemia.  He may have some further room for volume resuscitation.  That said he remains on norepinephrine after 5 L, is bradycardic.  Need to consider other possible causes.  No evidence for infection but consider occult sepsis.  Would initiate antibiotics if signs or symptoms of infection or any decompensation.  Consider underlying adrenal insufficiency, hypothyroidism, occult cardiogenic shock.  We will initiate work-up with a random cortisol (may need a cosyntropin stim test going forward), TSH, echocardiogram.  Question whether he may need a CVC for CVP monitoring and to administer pressors.  DKA, presumed related to poor medical compliance given his known diabetes and pancreatic insufficiency.  He should be able to transition off of insulin infusion to subcutaneous insulin now.  We will get the assistance of the diabetic coordinator to help sort out medications and medication compliance.  History of pancreatic necrosis.  Will need to remain on pancreatic enzyme replacement.  Will try to gently encourage p.o. intake, tolerating so far  Severe malnutrition and unintended weight loss.  Unclear cause but concerning for possible occult malignancy.  Will work-up as above.   Independent critical care time 33 minutes  [place], MD, PhD 03/23/2021, 3:24 PM [facility] [phone] or if no answer before 7:00PM call [phone] For any issues after 7:00PM please call eLink [phone]

## 2021-03-23 NOTE — Plan of Care (Signed)
  Problem: Urinary Elimination: Goal: Ability to achieve and maintain adequate renal perfusion and functioning will improve Outcome: Progressing   Problem: Respiratory: Goal: Will regain and/or maintain adequate ventilation 03/23/2021 2107 by Lenord Fellers, RN Outcome: Progressing 03/23/2021 2107 by Lenord Fellers, RN Outcome: Progressing   Problem: Nutritional: Goal: Maintenance of adequate nutrition will improve 03/23/2021 2107 by Lenord Fellers, RN Outcome: Progressing 03/23/2021 2107 by Lenord Fellers, RN Outcome: Progressing

## 2021-03-23 NOTE — Progress Notes (Signed)
Initial Nutrition Assessment  DOCUMENTATION CODES:   Severe malnutrition in context of chronic illness  INTERVENTION:  - will order Glucerna Shake BID, each supplement provides 220 kcal and 10 grams of protein. - will order 30 ml Prosource Plus BID, each supplement provides 100 kcal and 15 grams protein.  - will order 1 tablet multivitamin with minerals/day. - monitor for education needs prior to d/c.    NUTRITION DIAGNOSIS:   Severe Malnutrition related to social / environmental circumstances as evidenced by severe fat depletion, severe muscle depletion.  GOAL:   Patient will meet greater than or equal to 90% of their needs  MONITOR:   PO intake, Supplement acceptance, Labs, Weight trends  REASON FOR ASSESSMENT:   Malnutrition Screening Tool  ASSESSMENT:   60 y.o. 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 EGD 03/29/18 with removal of Axios stent), and L2-L3 posterior spinal fusion surgery. He presented to the ED due to weight loss, weakness, and DOE. He was found to be in DKA and started on insulin drip and IV fluids.  Diet advanced from NPO to Heart Healthy/Carb Modified yesterday at 1205. He was able to eat 100% of lunch yesterday.  Today he ate 100% of breakfast (omelet with cheese and greek yogurt) and 100% of lunch (grilled chicken salad with a baked potato).   He reports that until May or June he was doing very well, CBGs were well controlled, and that he weighed 205-213 lb.   He reports that over the past 3 months, and especially the past 1 month things have significantly declined. A large factor is finances and inability to afford medications. He was taking half dose of pancreatic  enzyme/meal and had self-limited with metformin d/t cost. He previously checked CBG 2 hours after lunch and dinner each day, but more recently has been checking CBG very infrequently.  He does notice a negative impact of only using half dose of pancreatic enzyme and has experienced GI distress and discomfort. There are no foods or beverages that he limits or avoids d/t this.   Weight on 9/19 was 173 lb and weight, per patient, in June was 213 lb. This indicates 40 lb weight loss (19% body weight) in the past 3 months; significant for time frame.  Patient is concerned about being told that his DM has transitioned to type 1. He would prefer not to need insulin. Briefly talked through this with patient.  DM Coordinator is consulted but has not yet seen him.    Labs reviewed; CBGs: 152-218 mg/dl today, K: 2.8 mmol/l, Ca: 8.1 mg/dl. Medications reviewed; sliding scale novolog, 20 units semglee/day, 36000 units creon TID, 40 mEq Klro-Con x1 dose 9/20 and x2 doses 9/21. IVF; NS-40 mEq KCl @ 100 ml/hr.    NUTRITION - FOCUSED PHYSICAL EXAM:  Flowsheet Row Most Recent Value  Orbital Region Severe depletion  Upper Arm Region Severe depletion  Thoracic and Lumbar Region Unable to assess  Buccal Region Severe depletion  Temple Region Moderate depletion  Clavicle Bone Region Severe depletion  Clavicle and Acromion Bone Region Severe depletion  Scapular Bone Region Unable to assess  Dorsal Hand Severe depletion  Patellar Region Unable to assess  Anterior Thigh Region Unable to assess  Posterior Calf Region Unable to assess  Edema (RD  Assessment) Unable to assess  Hair Reviewed  Eyes Reviewed  Mouth Reviewed  Skin Reviewed  Nails Reviewed       Diet Order:   Diet Order             Diet Carb Modified Fluid consistency: Thin; Room service appropriate? Yes  Diet effective now                   EDUCATION NEEDS:   Not appropriate for education at this time  Skin:  Skin  Assessment: Reviewed RN Assessment  Last BM:  9/20 (type 3 x1)  Height:   Ht Readings from Last 1 Encounters:  03/21/21 6\' 4"  (1.93 m)    Weight:   Wt Readings from Last 1 Encounters:  03/21/21 78.7 kg     Estimated Nutritional Needs:  Kcal:  2500-2750 kcal Protein:  125-140 grams Fluid:  >/= 2.8 L/day     03/23/21, MS, RD, LDN, CNSC Inpatient Clinical Dietitian RD pager # available in [facility]  After hours/weekend pager # available in [facility]

## 2021-03-23 NOTE — Progress Notes (Signed)
  Echocardiogram 2D Echocardiogram has been performed.  Pieter Partridge 03/23/2021, 3:03 PM

## 2021-03-23 NOTE — Consult Note (Signed)
NAME:  Allen Russell, MRN:  [id number], DOB:  June 13, 1961, LOS: 2 ADMISSION DATE:  03/21/2021, CONSULTATION DATE:  03/23/21 REFERRING MD:  Hanley Ben, CHIEF COMPLAINT:  weight loss, weakness   History of Present Illness:  R.S. is a 60 yo male presenting to [facility] ED on 03/21/21 reporting multiple concerns that began 2-3 months ago: unplanned ~50lb weight loss, increased thirst and PO water intake, denies increased urinary output, increased weakness, fatigue, and DOE. He reports 2 bloody BM's in the last few days and constipation. Denies cough, hx of COVID, URI-like symptoms, fever, chills. 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  Pt lives with his aunt and uncle in their garage apartment. His sister is in town visiting and noticed he was significantly weaker now than 23mo ago and brought him to the ED. Pt states he was able to workout a "couple hours a day" 3 months ago but is currently unable to complete ADLs w/o DOE. He reports financial difficulties and loss of insurance resulting in inability to afford food and medications. He has been rationing his metformin and pancreatic enzymes. He reports being sober for "several months" and consumes 2 protein drinks/day.   In the ED, labs notable for CBG: 356 and >500 glucose in his urine (9/19 @1537 ). CBG remained elevated, Pt started on an insulin gtt for suspected DKA, and admitted to Inpatient services. Started on levophed on 9/21 for hypotension.   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 Failure to Thrive Neuropathy Chronic back pain  Significant Hospital Events: Including procedures, antibiotic start and stop dates in addition to other pertinent events   9/19: [facility] ED 9/20: Admit to inpatient SD 9/21: Begin Levophed infusion, PCCM  consulted   Interim History / Subjective:  Pt reports feeling generally better today than yesterday but is still fatigued and reports generalized weakness.   Objective   Blood pressure (!) 98/57, pulse (!) 51, temperature 98 F (36.7 C), temperature source Oral, resp. rate 13, height 6\' 4"  (1.93 m), weight 78.7 kg, SpO2 97 %.        Intake/Output Summary (Last 24 hours) at 03/23/2021 0824 Last data filed at 03/23/2021 0800 Gross per 24 hour  Intake 6239.77 ml  Output 5275 ml  Net 964.77 ml   Filed Weights   03/21/21 1853 03/21/21 2136  Weight: 74.4 kg 78.7 kg    Examination: General: Calm, fatigued HENT:head atraumatic.  Eyes: PERRIL, mouth moist, no dental caries. Lungs: clear bilateral breath sounds. Unlabored, symmetrical chest expansion. No rhonchi, rales, wheezes.  Cardiovascular: no JVD, Normal Rate and rhythm. No edema present. Abdomen: Soft, non-tender, non-distended, no guarding. Active bowel sounds Extremities: BLE tenderness to palpatation. 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 Failure to thrive  Diabetic ketoacidosis secondary to rationing of glycemic medications, malnutrition, resulting in hypokalemia, dehydration, hypotension, and an AKI. Less likely to be r/t infection d/t not meeting SIRS criteria on admission and still does not meet Criteria. -As of 9/21 AM, BG under 200, anion gap has closed and HCO3 stable: BG: 156, anion gap: 9,  HCO3: 19. -Beta-hydroxybuturic down trending: 9/19: >8, 9/20: 3.32, 4.28, 2.06 -K:  2.9, supplemented -Cr: down trending since admission; 9/19: 1.35, 9/20:0.91, 9/21: 0.72. -Levophed continues to be up titrated: @ 35mcg/min. Plan: -Continues on insulin gtt per DKA protocol, wean to SQ medications as  tolerated -BMP q8h for evaluation of HCO3 and Cr Function -Cont. IVF: D5LR @125 /hr -Supplement KCl: of KCl ER q4h, give additional if needed -Wean levophed to MAP <65. -Start Midodrine 10mg  PO TID -Consider POCUS of IJ/Heart for fluid status -Consider additional fluid boluses if needed -Resume diet, consider NPO if increasing vasopressor requirements. -Nutrition consult for malnutrition -CM consult for social barriers -Labs: Cortisol, TSH, beta-hydroxybuturic, Lactic acid levels   Best Practice (right click and "Reselect all SmartList Selections" daily)   Diet/type: NPO DVT prophylaxis: LMWH GI prophylaxis: N/A Lines: N/A Foley:  N/A Code Status:  full code Last date of multidisciplinary goals of care discussion [03/21/2021]  Labs   CBC: Recent Labs  Lab 03/21/21 1551 03/22/21 0242 03/23/21 0547  WBC 5.0 5.0 4.7  NEUTROABS 3.3  --   --   HGB 14.7 12.5* 11.6*  HCT 41.8 33.3* 30.9*  MCV 87.8 84.3 83.5  PLT 202 150 189    Basic Metabolic Panel: Recent Labs  Lab 03/21/21 1551 03/21/21 2237 03/21/21 2241 03/22/21 0242 03/22/21 0612 03/22/21 1037 03/22/21 1424 03/22/21 1905 03/22/21 2151 03/23/21 0547  NA 135   < >  --  137   < > 139 138 136 136 138  K 3.2*   < >  --  3.1*   < > 3.0* 3.2* 3.2* 2.9* 2.9*  CL 106   < >  --  112*   < > 114* 115* 111 114* 110  CO2 9*   < >  --  16*   < > 15* 15* 16* 17* 19*  GLUCOSE 363*   < >  --  141*   < > 184* 148* 206* 223* 180*  BUN 24*   < >  --  18   < > 16 14 12 12 9   CREATININE 1.35*   < >  --  0.91   < > 0.95 0.90 0.86 0.87 0.72  CALCIUM 9.4   < >  --  8.7*   < > 8.9 8.6* 8.2* 8.3* 8.2*  MG 2.2  --  2.3 2.0  --   --   --   --   --  1.7  PHOS 3.4  --   --   --   --   --   --   --   --   --    < > = values in this interval not displayed.   GFR: Estimated Creatinine Clearance: 113.4 mL/min (by C-G formula based on SCr of 0.72 mg/dL). Recent Labs  Lab 03/21/21 1551 03/22/21 0242 03/23/21 0547  WBC 5.0 5.0 4.7     Liver Function Tests: Recent Labs  Lab 03/21/21 1551  AST 17  ALT 22  ALKPHOS 95  BILITOT 2.1*  PROT 7.4  ALBUMIN 4.4   No results for input(s): LIPASE, AMYLASE in the last 168 hours. No results for input(s): AMMONIA in the last 168 hours.  ABG    Component Value Date/Time   PHART 7.334 (L) 03/22/2021 1935   PCO2ART 25.3 (L) 03/22/2021 1935   PO2ART 113 (H) 03/22/2021 1935   HCO3 13.1 (L) 03/22/2021 1935   ACIDBASEDEF 11.1 (H) 03/22/2021 1935   O2SAT 96.4 03/22/2021 1935     Coagulation Profile: Recent Labs  Lab 03/21/21 1551  INR  1.1    Cardiac Enzymes: Recent Labs  Lab 03/21/21 1551  CKTOTAL 104    HbA1C: Hgb A1c MFr Bld  Date/Time Value Ref Range Status  03/21/2021 10:37 PM 11.4 (H) 4.8 - 5.6 % Final    Comment:    (NOTE) Pre diabetes:          5.7%-6.4%  Diabetes:              >6.4%  Glycemic control for   <7.0% adults with diabetes     CBG: Recent Labs  Lab 03/23/21 0230 03/23/21 0327 03/23/21 0430 03/23/21 0534 03/23/21 0623  GLUCAP 200* 185* 152* 173* 155*    Review of Systems:    Constitutional: progressive fatigue and generalized weakness. HENT: Negative.    Eyes: Negative.  denies any vision changes. Respiratory: positive for SOB, DOE.    Cardiovascular: Negative.   Gastrointestinal: Positive increased thirst, decreased appetite, weight loss, recent constipation and blood tinged stools. Genitourinary: Negative.   Musculoskeletal: Positive for weakness.   Skin: Negative.   Neurological: generalized weakness. Neuropathy pain present. Endo/Heme/Allergies: Negative.   Psychiatric/Behavioral: reports mental fog and fatigue.  Past Medical History:  He,  has a past medical history of Diabetes mellitus without complication (HCC).   Surgical History:   Past Surgical History:  Procedure Laterality Date   APPENDECTOMY     BACK SURGERY     CHOLECYSTECTOMY     HERNIA REPAIR       Social History:   reports that he has  never smoked. He has never used smokeless tobacco.   Family History:  His family history includes Breast cancer in his mother and sister; Hypertension in his paternal grandfather; Ovarian cancer in his maternal grandmother; Pancreatic cancer in his maternal grandmother; Thyroid disease in his mother.   Allergies Allergies  Allergen Reactions   Morphine Other (See Comments)    headache       Home Medications  Prior to Admission medications   Medication Sig Start Date End Date Taking? Authorizing Provider  diphenhydrAMINE (BENADRYL) 25 MG tablet Take 25 mg by mouth every 6 (six) hours as needed for allergies.   Yes [provider]  gabapentin (NEURONTIN) 600 MG tablet TAKE 1 TABLET BY MOUTH 4 TIMES DAILY Patient taking differently: Take 300 mg by mouth daily. 06/24/18  Yes Kirsteins, Victorino Sparrow, MD  metFORMIN (GLUCOPHAGE-XR) 500 MG 24 hr tablet Take 250 mg by mouth daily with breakfast.   Yes [provider]  Multiple Vitamin (MULTIVITAMIN) tablet Take 1 tablet by mouth daily.   Yes [provider]  ranitidine (ZANTAC) 150 MG tablet Take 150 mg by mouth daily as needed for heartburn.   Yes [provider]  traMADol (ULTRAM) 50 MG tablet TAKE 1 TABLET BY MOUTH EVERY 6 HOURS AS NEEDED Patient taking differently: Take 50 mg by mouth daily as needed for moderate pain. 02/21/18  Yes Kirsteins, Victorino Sparrow, MD  cyclobenzaprine (FLEXERIL) 10 MG tablet Take 1 tablet (10 mg total) by mouth 2 (two) times daily between meals as needed. Patient not taking: No sig reported 12/14/17   Kirsteins, Victorino Sparrow, MD         Juanda Chance, NP Student

## 2021-03-23 NOTE — Progress Notes (Signed)
Patient ID: Allen Russell, male   DOB: [birth year], 60 y.o.   MRN: [id number]  PROGRESS NOTE    MATHEAU ORONA  UYQ:[id number] DOB: 06/26/1961 DOA: 03/21/2021 PCP: Steve Rattler, PA-C   Brief Narrative:  60 y.o. 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 EGD 03/29/18 with removal of Axios stent), L2-L3 posterior spinal fusion surgery, who presented with weight loss, weakness, and dyspnea on exertion, and found to be in DKA.  He was started on insulin drip and IV fluids.  Assessment & Plan:   DKA: Present on admission in a patient with diabetes mellitus type 2 -Patient is on metformin at home.  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 -A1c 11.4.  Consult diabetes coordinator -Anion gap has closed.  Currently tolerating diet.  Switch to long-acting insulin and subsequently DC insulin drip.  Continue CBGs with SSI  Hypokalemia -Replace.  Repeat a.m. labs  AKI -Treated with IV fluids.  Resolved.  Possibly due to dehydration  Shock -Possibly hypovolemic shock.  He is currently on Levophed drip.  Blood pressure improving but still on the lower side.  He has also been started on midodrine for now which will be continued.  PCCM will also see the patient today.  Spoke to Dr. Kaylyn Lim.  Weight loss due to starvation -Nutrition consult  History of alcohol abuse History of alcoholic pancreatitis complicated by prior pancreatic necrosis Pancreatic insufficiency -Denies current alcohol use.  Abdominal exam is benign.  Might need outpatient GI follow-up -Currently has been started on Creon   DVT  prophylaxis: Lovenox Code Status: Full Family Communication: None at bedside Disposition Plan: Status is: Inpatient  Not inpatient appropriate, will call UM team and downgrade to OBS.   Dispo: The patient is from: Home              Anticipated d/c is to: Home              Patient currently is not medically stable to d/c.   Difficult to place patient No   Consultants: PCCM  Procedures: None  Antimicrobials: None   Subjective: Patient seen and examined at bedside.  Denies current chest pain, nausea, vomiting or fever.  Denies worsening abdominal pain.  Objective: Vitals:   03/23/21 0530 03/23/21 0600 03/23/21 0630 03/23/21 0645  BP: (!) 95/55 (!) 94/56  (!) 98/57  Pulse: (!) 53 (!) 52  (!) 51  Resp: 11 13  13   Temp:   98 F (36.7 C)   TempSrc:   Oral   SpO2: 96% 96%  97%  Weight:      Height:        Intake/Output Summary (Last 24 hours) at 03/23/2021 1044 Last data filed at 03/23/2021 1000 Gross per 24 hour  Intake 5988.81 ml  Output 5025 ml  Net 963.81 ml   Filed Weights   03/21/21 1853 03/21/21 2136  Weight: 74.4 kg 78.7 kg    Examination:  General exam: Appears calm and comfortable.  Currently on room air Respiratory system: Bilateral decreased breath sounds at bases Cardiovascular system: S1 & S2 heard, intermittently bradycardic Gastrointestinal system: Abdomen is nondistended, soft and nontender. Normal bowel sounds heard. Extremities: No cyanosis, clubbing, edema  Central nervous system: Alert and oriented. No focal neurological deficits. Moving extremities Skin: No rashes, lesions or ulcers Psychiatry: Affect is mostly flat   Data Reviewed: I have personally reviewed following labs and imaging studies  CBC: Recent Labs  Lab 03/21/21 1551 03/22/21 0242 03/23/21 0547  WBC 5.0 5.0 4.7  NEUTROABS 3.3  --   --   HGB 14.7 12.5* 11.6*  HCT 41.8 33.3* 30.9*  MCV 87.8 84.3 83.5  PLT 202 150 189   Basic Metabolic Panel: Recent Labs  Lab  03/21/21 1551 03/21/21 2237 03/21/21 2241 03/22/21 0242 03/22/21 0612 03/22/21 1037 03/22/21 1424 03/22/21 1905 03/22/21 2151 03/23/21 0547  NA 135   < >  --  137   < > 139 138 136 136 138  K 3.2*   < >  --  3.1*   < > 3.0* 3.2* 3.2* 2.9* 2.9*  CL 106   < >  --  112*   < > 114* 115* 111 114* 110  CO2 9*   < >  --  16*   < > 15* 15* 16* 17* 19*  GLUCOSE 363*   < >  --  141*   < > 184* 148* 206* 223* 180*  BUN 24*   < >  --  18   < > 16 14 12 12 9   CREATININE 1.35*   < >  --  0.91   < > 0.95 0.90 0.86 0.87 0.72  CALCIUM 9.4   < >  --  8.7*   < > 8.9 8.6* 8.2* 8.3* 8.2*  MG 2.2  --  2.3 2.0  --   --   --   --   --  1.7  PHOS 3.4  --   --   --   --   --   --   --   --   --    < > = values in this interval not displayed.   GFR: Estimated Creatinine Clearance: 113.4 mL/min (by C-G formula based on SCr of 0.72 mg/dL). Liver Function Tests: Recent Labs  Lab 03/21/21 1551  AST 17  ALT 22  ALKPHOS 95  BILITOT 2.1*  PROT 7.4  ALBUMIN 4.4   No results for input(s): LIPASE, AMYLASE in the last 168 hours. No results for input(s): AMMONIA in the last 168 hours. Coagulation Profile: Recent Labs  Lab 03/21/21 1551  INR 1.1   Cardiac Enzymes: Recent Labs  Lab 03/21/21 1551  CKTOTAL 104   BNP (last 3 results) No results for input(s): PROBNP in the last 8760 hours. HbA1C: Recent Labs    03/21/21 2237  HGBA1C 11.4*   CBG: Recent Labs  Lab 03/23/21 0534 03/23/21 0623 03/23/21 0840 03/23/21 0934 03/23/21 1036  GLUCAP 173* 155* 158* 182* 218*   Lipid Profile: No results for input(s): CHOL, HDL, LDLCALC, TRIG, CHOLHDL, LDLDIRECT in the last 72 hours. Thyroid Function Tests: No results for input(s): TSH, T4TOTAL, FREET4, T3FREE, THYROIDAB in the last 72 hours. Anemia Panel: No results for input(s): VITAMINB12, FOLATE, FERRITIN, TIBC, IRON, RETICCTPCT in the last 72 hours. Sepsis Labs: No results for input(s): PROCALCITON, LATICACIDVEN in the last 168 hours.  Recent  Results (from the past 240 hour(s))  SARS CORONAVIRUS 2 (TAT 6-24 HRS) Nasopharyngeal Nasopharyngeal Swab     Status: None   Collection Time: 03/21/21  6:01 PM   Specimen: Nasopharyngeal Swab  Result Value Ref Range Status   SARS Coronavirus 2 NEGATIVE NEGATIVE Final  Comment: (NOTE) SARS-CoV-2 target nucleic acids are NOT DETECTED.  The SARS-CoV-2 RNA is generally detectable in upper and lower respiratory specimens during the acute phase of infection. Negative results do not preclude SARS-CoV-2 infection, do not rule out co-infections with other pathogens, and should not be used as the sole basis for treatment or other patient management decisions. Negative results must be combined with clinical observations, patient history, and epidemiological information. The expected result is Negative.  Fact Sheet for Patients: HairSlick.no  Fact Sheet for Healthcare Providers: quierodirigir.com  This test is not yet approved or cleared by the [place] FDA and  has been authorized for detection and/or diagnosis of SARS-CoV-2 by FDA under an Emergency Use Authorization (EUA). This EUA will remain  in effect (meaning this test can be used) for the duration of the COVID-19 declaration under Se ction 564(b)(1) of the Act, 21 U.S.C. section 360bbb-3(b)(1), unless the authorization is terminated or revoked sooner.  Performed at [facility], [address], [place], [place] [address]   MRSA Next Gen by PCR, Nasal     Status: None   Collection Time: 03/21/21  9:37 PM   Specimen: Nasal Mucosa; Nasal Swab  Result Value Ref Range Status   MRSA by PCR Next Gen NOT DETECTED NOT DETECTED Final    Comment: (NOTE) The GeneXpert MRSA Assay (FDA approved for NASAL specimens only), is one component of a comprehensive MRSA colonization surveillance program. It is not intended to diagnose MRSA infection nor to guide or monitor treatment  for MRSA infections. Test performance is not FDA approved in patients less than 5 years old. Performed at [facility], [address] [place], [place] [address]          Radiology Studies: No results found.      Scheduled Meds:  Chlorhexidine Gluconate Cloth  6 each Topical Daily   enoxaparin (LOVENOX) injection  40 mg Subcutaneous Q24H   influenza vac split quadrivalent PF  0.5 mL Intramuscular Tomorrow-1000   insulin aspart  0-15 Units Subcutaneous TID WC   insulin aspart  0-5 Units Subcutaneous QHS   insulin glargine-yfgn  20 Units Subcutaneous Daily   lipase/protease/amylase  36,000 Units Oral TID WC   mouth rinse  15 mL Mouth Rinse BID   midodrine  10 mg Oral TID WC   potassium chloride  40 mEq Oral Q4H   Continuous Infusions:  sodium chloride 250 mL (03/23/21 0940)   dextrose 5% lactated ringers 125 mL/hr at 03/23/21 0700   insulin 1.2 Units/hr (03/23/21 0700)   lactated ringers Stopped (03/21/21 2214)   norepinephrine (LEVOPHED) Adult infusion 8 mcg/min (03/23/21 0956)          Glade Lloyd, MD [facility] 03/23/2021, 10:44 AM

## 2021-03-23 NOTE — Progress Notes (Signed)
    OVERNIGHT PROGRESS REPORT  Report from days that patient may require additional bolus doses of IVF. Also CCM had been contacted and recommended that patient may require vasopressor support of which he is on at this time .  Patient remains awake and oriented and mentation is proper at this time. He is in no stated or obvious distress.   Chinita Greenland MSNA MSN ACNPC-AG Acute Care Nurse Practitioner [facility]

## 2021-03-24 ENCOUNTER — Inpatient Hospital Stay (HOSPITAL_COMMUNITY): Payer: Self-pay

## 2021-03-24 ENCOUNTER — Inpatient Hospital Stay: Payer: Self-pay

## 2021-03-24 DIAGNOSIS — E43 Unspecified severe protein-calorie malnutrition: Secondary | ICD-10-CM | POA: Insufficient documentation

## 2021-03-24 DIAGNOSIS — R579 Shock, unspecified: Secondary | ICD-10-CM

## 2021-03-24 LAB — BASIC METABOLIC PANEL
Anion gap: 7 (ref 5–15)
Anion gap: 10 (ref 5–15)
BUN: 9 mg/dL (ref 6–20)
BUN: 10 mg/dL (ref 6–20)
CO2: 22 mmol/L (ref 22–32)
CO2: 24 mmol/L (ref 22–32)
Calcium: 8.2 mg/dL — ABNORMAL LOW (ref 8.9–10.3)
Calcium: 8.8 mg/dL — ABNORMAL LOW (ref 8.9–10.3)
Chloride: 107 mmol/L (ref 98–111)
Chloride: 101 mmol/L (ref 98–111)
Creatinine, Ser: 0.73 mg/dL (ref 0.61–1.24)
Creatinine, Ser: 0.6 mg/dL — ABNORMAL LOW (ref 0.61–1.24)
GFR, Estimated: >60 mL/min (ref >60)
GFR, Estimated: >60 mL/min (ref >60)
Glucose, Bld: 190 mg/dL — ABNORMAL HIGH (ref 70–99)
Glucose, Bld: 261 mg/dL — ABNORMAL HIGH (ref 70–99)
Potassium: 3.2 mmol/L — ABNORMAL LOW (ref 3.5–5.1)
Potassium: 3.2 mmol/L — ABNORMAL LOW (ref 3.5–5.1)
Sodium: 136 mmol/L (ref 135–145)
Sodium: 135 mmol/L (ref 135–145)

## 2021-03-24 LAB — HEPATITIS PANEL, ACUTE
HCV Ab: NONREACTIVE
Hep A IgM: NONREACTIVE
Hep B C IgM: NONREACTIVE
Hepatitis B Surface Ag: NONREACTIVE

## 2021-03-24 LAB — ECHOCARDIOGRAM COMPLETE
Area-P 1/2: 1.86 cm2
Height: 76 in
S' Lateral: 3.9 cm
Single Plane A4C EF: 58.4 %
Weight: 2776.03 oz

## 2021-03-24 LAB — TSH: TSH: 1.41 u[IU]/mL (ref 0.350–4.500)

## 2021-03-24 LAB — GLUCOSE, CAPILLARY
Glucose-Capillary: 194 mg/dL — ABNORMAL HIGH (ref 70–99)
Glucose-Capillary: 293 mg/dL — ABNORMAL HIGH (ref 70–99)
Glucose-Capillary: 298 mg/dL — ABNORMAL HIGH (ref 70–99)
Glucose-Capillary: 302 mg/dL — ABNORMAL HIGH (ref 70–99)

## 2021-03-24 LAB — LIPASE, BLOOD: Lipase: 38 U/L (ref 11–51)

## 2021-03-24 LAB — OSMOLALITY, URINE: Osmolality, Ur: 220 mOsm/kg — ABNORMAL LOW (ref 300–900)

## 2021-03-24 LAB — CANCER ANTIGEN 19-9: CA 19-9: 69 U/mL — ABNORMAL HIGH (ref 0–35)

## 2021-03-24 LAB — MAGNESIUM: Magnesium: 2 mg/dL (ref 1.7–2.4)

## 2021-03-24 MED ORDER — POTASSIUM CHLORIDE CRYS ER 20 MEQ PO TBCR
40.0000 meq | EXTENDED_RELEASE_TABLET | Freq: Once | ORAL | Status: AC
  Administered 2021-03-24: 40 meq via ORAL
  Filled 2021-03-24: qty 2

## 2021-03-24 MED ORDER — HYDROCORTISONE SOD SUC (PF) 100 MG IJ SOLR
100.0000 mg | Freq: Three times a day (TID) | INTRAMUSCULAR | Status: DC
  Administered 2021-03-24: 100 mg via INTRAVENOUS
  Filled 2021-03-24: qty 2

## 2021-03-24 MED ORDER — TRAMADOL HCL 50 MG PO TABS
50.0000 mg | ORAL_TABLET | Freq: Four times a day (QID) | ORAL | Status: DC | PRN
  Administered 2021-04-01 – 2021-04-02 (×2): 50 mg via ORAL
  Filled 2021-03-24 (×2): qty 1

## 2021-03-24 MED ORDER — GABAPENTIN 300 MG PO CAPS
600.0000 mg | ORAL_CAPSULE | Freq: Two times a day (BID) | ORAL | Status: DC
  Administered 2021-03-24 – 2021-03-29 (×11): 600 mg via ORAL
  Filled 2021-03-24 (×11): qty 2

## 2021-03-24 MED ORDER — POTASSIUM CHLORIDE CRYS ER 20 MEQ PO TBCR
40.0000 meq | EXTENDED_RELEASE_TABLET | ORAL | Status: AC
  Administered 2021-03-24 (×2): 40 meq via ORAL
  Filled 2021-03-24 (×2): qty 2

## 2021-03-24 MED ORDER — THIAMINE HCL 100 MG PO TABS
100.0000 mg | ORAL_TABLET | Freq: Every day | ORAL | Status: DC
  Administered 2021-03-24 – 2021-04-07 (×15): 100 mg via ORAL
  Filled 2021-03-24 (×15): qty 1

## 2021-03-24 MED ORDER — LACTATED RINGERS IV SOLN: INTRAVENOUS | Status: DC

## 2021-03-24 MED ORDER — FOLIC ACID 1 MG PO TABS
1.0000 mg | ORAL_TABLET | Freq: Every day | ORAL | Status: DC
  Administered 2021-03-24 – 2021-04-07 (×15): 1 mg via ORAL
  Filled 2021-03-24 (×16): qty 1

## 2021-03-24 MED ORDER — LACTATED RINGERS IV BOLUS
1000.0000 mL | Freq: Once | INTRAVENOUS | Status: AC
  Administered 2021-03-24: 1000 mL via INTRAVENOUS

## 2021-03-24 MED ORDER — SODIUM CHLORIDE 0.9% FLUSH
10.0000 mL | Freq: Two times a day (BID) | INTRAVENOUS | Status: DC
  Administered 2021-03-24 – 2021-03-25 (×4): 10 mL
  Administered 2021-03-26: 20 mL
  Administered 2021-03-26 – 2021-04-06 (×20): 10 mL

## 2021-03-24 MED ORDER — IOHEXOL 350 MG/ML SOLN
80.0000 mL | Freq: Once | INTRAVENOUS | Status: AC | PRN
  Administered 2021-03-24: 80 mL via INTRAVENOUS

## 2021-03-24 MED ORDER — IOHEXOL 9 MG/ML PO SOLN
ORAL | Status: AC
  Filled 2021-03-24: qty 1000

## 2021-03-24 MED ORDER — COSYNTROPIN 0.25 MG IJ SOLR
0.2500 mg | Freq: Once | INTRAMUSCULAR | Status: AC
  Administered 2021-03-25: 0.25 mg via INTRAVENOUS
  Filled 2021-03-24: qty 0.25

## 2021-03-24 MED ORDER — IOHEXOL 9 MG/ML PO SOLN
500.0000 mL | ORAL | Status: AC
  Administered 2021-03-24 (×2): 500 mL via ORAL

## 2021-03-24 MED ORDER — SODIUM CHLORIDE 0.9% FLUSH: 10.0000 mL | INTRAVENOUS | Status: DC | PRN

## 2021-03-24 MED ORDER — PHENYLEPHRINE HCL-NACL 20-0.9 MG/250ML-% IV SOLN
25.0000 ug/min | INTRAVENOUS | Status: DC
  Administered 2021-03-24: 55 ug/min via INTRAVENOUS
  Administered 2021-03-24: 25 ug/min via INTRAVENOUS
  Administered 2021-03-24: 45 ug/min via INTRAVENOUS
  Administered 2021-03-25: 75 ug/min via INTRAVENOUS
  Administered 2021-03-25: 55 ug/min via INTRAVENOUS
  Filled 2021-03-24 (×5): qty 250

## 2021-03-24 NOTE — Progress Notes (Signed)
Patient ID: Allen Russell, male   DOB: [birth year], 60 y.o.   MRN: [id number] Patient still on pressors and blood pressure still borderline on the lower side.  Patient's care has been taken over by PCCM team from today morning onwards.  Hospitalist team will sign off for now.  Please recall [place] once the patient is off pressors

## 2021-03-24 NOTE — Progress Notes (Signed)
Peripherally Inserted Central Catheter Placement  The IV Nurse has discussed with the patient and/or persons authorized to consent for the patient, the purpose of this procedure and the potential benefits and risks involved with this procedure.  The benefits include less needle sticks, lab draws from the catheter, and the patient may be discharged home with the catheter. Risks include, but not limited to, infection, bleeding, blood clot (thrombus formation), and puncture of an artery; nerve damage and irregular heartbeat and possibility to perform a PICC exchange if needed/ordered by physician.  Alternatives to this procedure were also discussed.  Bard Power PICC patient education guide, fact sheet on infection prevention and patient information card has been provided to patient /or left at bedside.    PICC Placement Documentation  PICC Double Lumen 03/24/21 PICC Left Basilic 44 cm 0 cm (Active)  Site Assessment Clean;Dry;Intact 03/24/21 1234  Lumen #1 Status Flushed;Saline locked;Blood return noted 03/24/21 1234  Lumen #2 Status Blood return noted;Saline locked;Flushed 03/24/21 1234  Dressing Type Transparent;Securing device 03/24/21 1234  Dressing Status Clean;Dry;Intact 03/24/21 1234  Antimicrobial disc in place? Yes 03/24/21 1234  Safety Lock Not Applicable 03/24/21 1234  Dressing Change Due 03/31/21 03/24/21 1234       Romie Jumper 03/24/2021, 12:37 PM

## 2021-03-24 NOTE — TOC Progression Note (Signed)
Transition of Care [facility]) - Progression Note    Patient Details  Name: Allen Russell MRN: [id number] Date of Birth: [birth year]  [facility] CM/SW Contact  Golda Acre, RN Phone Number: 03/24/2021, 7:26 AM  Clinical Narrative:    60 year old gentleman with history of remote alcohol abuse with associated chronic pancreatitis complicated by pancreatic necrosis, also diabetes, chronic back pain and neuropathy. He has been chronically ill for about 3 months with weakness, decreased activity tolerance, unintended weight loss of over 50 pounds.  Some of this related to inability to afford and take his medications including his metformin and his pancreatic enzymes.  He was brought to the ED 9/19 due to acute on chronic profound weakness and deconditioning superimposed on the above.  His initial evaluation was consistent with shock, metabolic panel consistent with DKA, acute renal insufficiency.  He was treated for DKA and for presumed hypovolemic shock, but remained on norepinephrine infusion even after volume resuscitation and improvement in his metabolic status.  PCCM consulted on 9/21.  Interval: He has been treated with insulin infusion, is just transitioning now to subcutaneous insulin.  He remains on norepinephrine 5:08 liters volume resuscitation.  Lactic acid is normal.  Toc plan of care: Following for toc needs and progression Expected Discharge Plan: Home/Self Care Barriers to Discharge: Continued Medical Work up  Expected Discharge Plan and Services Expected Discharge Plan: Home/Self Care   Discharge Planning Services: CM Consult   Living arrangements for the past 2 months: Single Family Home                                       Social Determinants of Health (SDOH) Interventions    Readmission Risk Interventions No flowsheet data found.

## 2021-03-24 NOTE — Progress Notes (Signed)
eLink Physician-Brief Progress Note Patient Name: Allen Russell DOB: 04/14/1961 MRN: [id number]   Date of Service  03/24/2021  HPI/Events of Note  Hypotension - BP soft - 85/55 with MAP = 63. LVEF = 50-55%. Phenylephrine IV infusion via PIV being titrated up. No CVL or CVP.  eICU Interventions  Plan: Bolus with LR 1 liter IV over 1 hour now.  LR IV infusion at 75 mL/hour.     Intervention Category Major Interventions: Hypotension - evaluation and management  Jaivian Battaglini Dennard Nip 03/24/2021, 11:03 PM

## 2021-03-24 NOTE — Progress Notes (Signed)
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  Some of this related to inability to afford and take his medications including his metformin and his pancreatic enzymes.  He was brought to the ED 9/19 due to acute on chronic profound weakness and deconditioning superimposed on the above.    His initial evaluation was consistent with shock, metabolic panel consistent with DKA, acute renal insufficiency.  He was treated for DKA and for presumed hypovolemic shock, but remained on norepinephrine infusion even after volume resuscitation and improvement in his metabolic status.  PCCM consulted on 9/21.  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 Failure to Thrive Neuropathy Chronic back pain  Significant Hospital Events:  9/19: [facility] ED 9/20: Admit to inpatient SD 9/21: Begin Levophed infusion, PCCM consulted 9/22:  Interim History / Subjective:  No reported events overnight  ~4.8L urine out in the last 24hrs  Objective   Blood pressure (!) 85/47, pulse (!) 45, temperature 97.6 F (36.4 C), temperature source Oral, resp. rate 14, height 6\' 4"  (1.93 m), weight 81.7 kg, SpO2 98 %.        Intake/Output Summary (Last 24 hours) at 03/24/2021 0730 Last data filed at 03/24/2021 0600 Gross per 24 hour  Intake 2893.93 ml  Output 4850 ml  Net -1956.07 ml    Filed Weights   03/21/21 1853 03/21/21 2136 03/24/21 0500  Weight: 74.4 kg 78.7 kg 81.7 kg     Examination: General: Acute on chronically ill appearing middle aged male lying in bed in NAD HEENT: [place]/AT, MM pink/moist, PERRL,  Neuro: Alert and oriented x3, non-focal, generalized weakness  CV: s1s2 regular rate and rhythm, no murmur, rubs, or gallops,  PULM:  Clear to ascultation bilaterally, no increased work of breathing, no added breath sounds  GI: soft, bowel sounds active in all 4 quadrants, non-tender, non-distended, tolerating TF Extremities: warm/dry, no edema  Skin: no rashes or lesions  Resolved Hospital Problem list   DKA  Assessment & Plan:  Undifferentiated shock  -Initially mostly felt secondary to hypovolemia in the setting of DKA and chronic hyperglycemia however patient is now s/p IV resuscitation and continues to require vasopressor support  -Differential list includes underlying septic shock (less likely) vs adrenal insufficiency -Hypothyroidism less likely as TSH normal at 1.410 -ECHO with EF 50-55% with normal LV function, RV with mild reduced function this coupled with warm dry extremities suggesting cardiogenic shock unlikely  P: Random cortisol 12.1 therefore a trial of stress dose steroids were started 9/22 Encouraged oral intake  Continue Midodrine  Change pressor from Levo to Neo Place PICC  For completeness obtain blood cultures  Check urine osmolarity   Concern for underlying malignant process  -With severe deconditioning and malnutrition with profound unintentional weight loss  -CA-19 evaluated at 69 P: Will proceed with CT scan of chest, ABD, and Pelvis  Obtain CEA   Hx of DM type 2 -Home medication includes Metformin  P: SSI  CBG checks ACHS  Home metformin remains on hold   Alcohol  abuse: Pt reports sober x "few months" 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 Harris  Total critical care time: 42 minutes  Critical care time was exclusive of separately billable procedures and treating other patients.  Critical care was necessary to treat or prevent imminent or life-threatening deterioration.  Critical care was time spent personally by me on the following activities: development of treatment plan with patient and/or surrogate as well as nursing, discussions with consultants, evaluation of patient's response to treatment, examination of patient, obtaining history from patient or surrogate, ordering and performing treatments and interventions, ordering and review of laboratory studies, ordering and review of radiographic studies, pulse oximetry and re-evaluation of patient's condition.  Mikaiya Tramble D. Tiburcio Pea, NP-C [facility] Personal contact information can be found on Amion  03/24/2021, 8:50 AM

## 2021-03-25 ENCOUNTER — Inpatient Hospital Stay (HOSPITAL_COMMUNITY): Payer: Self-pay

## 2021-03-25 DIAGNOSIS — E43 Unspecified severe protein-calorie malnutrition: Secondary | ICD-10-CM

## 2021-03-25 LAB — CBC
HCT: 31 % — ABNORMAL LOW (ref 39.0–52.0)
Hemoglobin: 11 g/dL — ABNORMAL LOW (ref 13.0–17.0)
MCH: 31.3 pg (ref 26.0–34.0)
MCHC: 35.5 g/dL (ref 30.0–36.0)
MCV: 88.3 fL (ref 80.0–100.0)
Platelets: 184 10*3/uL (ref 150–400)
RBC: 3.51 MIL/uL — ABNORMAL LOW (ref 4.22–5.81)
RDW: 16.5 % — ABNORMAL HIGH (ref 11.5–15.5)
WBC: 5.1 10*3/uL (ref 4.0–10.5)
nRBC: 0 % (ref 0.0–0.2)

## 2021-03-25 LAB — BASIC METABOLIC PANEL
Anion gap: 5 (ref 5–15)
Anion gap: 8 (ref 5–15)
Anion gap: 6 (ref 5–15)
BUN: 11 mg/dL (ref 6–20)
BUN: 11 mg/dL (ref 6–20)
BUN: 13 mg/dL (ref 6–20)
CO2: 28 mmol/L (ref 22–32)
CO2: 27 mmol/L (ref 22–32)
CO2: 29 mmol/L (ref 22–32)
Calcium: 8.6 mg/dL — ABNORMAL LOW (ref 8.9–10.3)
Calcium: 8.4 mg/dL — ABNORMAL LOW (ref 8.9–10.3)
Calcium: 8.3 mg/dL — ABNORMAL LOW (ref 8.9–10.3)
Chloride: 111 mmol/L (ref 98–111)
Chloride: 99 mmol/L (ref 98–111)
Chloride: 92 mmol/L — ABNORMAL LOW (ref 98–111)
Creatinine, Ser: 0.52 mg/dL — ABNORMAL LOW (ref 0.61–1.24)
Creatinine, Ser: 0.67 mg/dL (ref 0.61–1.24)
Creatinine, Ser: 0.7 mg/dL (ref 0.61–1.24)
GFR, Estimated: >60 mL/min (ref >60)
GFR, Estimated: >60 mL/min (ref >60)
GFR, Estimated: >60 mL/min (ref >60)
Glucose, Bld: 131 mg/dL — ABNORMAL HIGH (ref 70–99)
Glucose, Bld: 249 mg/dL — ABNORMAL HIGH (ref 70–99)
Glucose, Bld: 304 mg/dL — ABNORMAL HIGH (ref 70–99)
Potassium: 3.1 mmol/L — ABNORMAL LOW (ref 3.5–5.1)
Potassium: 3.9 mmol/L (ref 3.5–5.1)
Potassium: 3.8 mmol/L (ref 3.5–5.1)
Sodium: 144 mmol/L (ref 135–145)
Sodium: 134 mmol/L — ABNORMAL LOW (ref 135–145)
Sodium: 127 mmol/L — ABNORMAL LOW (ref 135–145)

## 2021-03-25 LAB — GLUCOSE, CAPILLARY
Glucose-Capillary: 135 mg/dL — ABNORMAL HIGH (ref 70–99)
Glucose-Capillary: 198 mg/dL — ABNORMAL HIGH (ref 70–99)
Glucose-Capillary: 381 mg/dL — ABNORMAL HIGH (ref 70–99)
Glucose-Capillary: 401 mg/dL — ABNORMAL HIGH (ref 70–99)
Glucose-Capillary: 350 mg/dL — ABNORMAL HIGH (ref 70–99)
Glucose-Capillary: 319 mg/dL — ABNORMAL HIGH (ref 70–99)

## 2021-03-25 LAB — CREATININE CLEARANCE, URINE, 24 HOUR
Collection Interval-CRCL: 24 hours
Creatinine Clearance: 111 mL/min (ref 75–125)
Creatinine, 24H Ur: 1067 mg/d (ref 800–2000)
Creatinine, Urine: 12.89 mg/dL
Urine Total Volume-CRCL: 8275 mL

## 2021-03-25 LAB — ACTH STIMULATION, 3 TIME POINTS
Cortisol, 30 Min: 23.8 ug/dL
Cortisol, 60 Min: 26.5 ug/dL
Cortisol, Base: 9.3 ug/dL

## 2021-03-25 LAB — OSMOLALITY: Osmolality: 293 mOsm/kg (ref 275–295)

## 2021-03-25 LAB — NA AND K (SODIUM & POTASSIUM), 24 H UR
Potassium Urine: 12 mmol/L
Potassium, 24H Ur: 99 mmol/d (ref 25–125)
Sodium, 24H Ur: 405 mEq/d — ABNORMAL HIGH (ref 40–220)
Sodium, Ur: 49 mmol/L
Urine Total Volume-UNAK24: 8275 mL

## 2021-03-25 LAB — CEA: CEA: 14.3 ng/mL — ABNORMAL HIGH (ref 0.0–4.7)

## 2021-03-25 LAB — OSMOLALITY, URINE
Osmolality, Ur: 122 mOsm/kg — ABNORMAL LOW (ref 300–900)
Osmolality, Ur: 545 mOsm/kg (ref 300–900)

## 2021-03-25 LAB — TROPONIN I (HIGH SENSITIVITY)
Troponin I (High Sensitivity): 5 ng/L (ref <18)
Troponin I (High Sensitivity): 4 ng/L (ref <18)

## 2021-03-25 LAB — PSA: Prostatic Specific Antigen: 4.07 ng/mL — ABNORMAL HIGH (ref 0.00–4.00)

## 2021-03-25 LAB — MAGNESIUM: Magnesium: 2.2 mg/dL (ref 1.7–2.4)

## 2021-03-25 MED ORDER — INSULIN ASPART 100 UNIT/ML IJ SOLN
10.0000 [IU] | Freq: Once | INTRAMUSCULAR | Status: AC
  Administered 2021-03-25: 10 [IU] via SUBCUTANEOUS

## 2021-03-25 MED ORDER — INSULIN ASPART 100 UNIT/ML IJ SOLN
4.0000 [IU] | Freq: Three times a day (TID) | INTRAMUSCULAR | Status: DC
  Administered 2021-03-25 – 2021-03-27 (×5): 4 [IU] via SUBCUTANEOUS

## 2021-03-25 MED ORDER — INSULIN GLARGINE-YFGN 100 UNIT/ML ~~LOC~~ SOLN
15.0000 [IU] | Freq: Two times a day (BID) | SUBCUTANEOUS | Status: DC
  Administered 2021-03-25: 15 [IU] via SUBCUTANEOUS
  Filled 2021-03-25 (×2): qty 0.15

## 2021-03-25 MED ORDER — INSULIN STARTER KIT- PEN NEEDLES (ENGLISH)
1.0000 | Freq: Once | Status: AC
  Administered 2021-03-25: 1
  Filled 2021-03-25: qty 1

## 2021-03-25 MED ORDER — DESMOPRESSIN ACETATE 4 MCG/ML IJ SOLN
4.0000 ug | Freq: Once | INTRAMUSCULAR | Status: AC
  Administered 2021-03-25: 4 ug via INTRAVENOUS
  Filled 2021-03-25: qty 1

## 2021-03-25 MED ORDER — NOREPINEPHRINE 4 MG/250ML-% IV SOLN
0.0000 ug/min | INTRAVENOUS | Status: DC
  Administered 2021-03-25: 11 ug/min via INTRAVENOUS
  Administered 2021-03-25: 10 ug/min via INTRAVENOUS
  Administered 2021-03-25: 2 ug/min via INTRAVENOUS
  Administered 2021-03-26: 13 ug/min via INTRAVENOUS
  Administered 2021-03-26: 7 ug/min via INTRAVENOUS
  Administered 2021-03-27: 14 ug/min via INTRAVENOUS
  Administered 2021-03-27: 11 ug/min via INTRAVENOUS
  Administered 2021-03-27: 5 ug/min via INTRAVENOUS
  Administered 2021-03-27: 14 ug/min via INTRAVENOUS
  Administered 2021-03-28: 7.5 ug/min via INTRAVENOUS
  Administered 2021-03-29 (×2): 7 ug/min via INTRAVENOUS
  Administered 2021-03-30: 8 ug/min via INTRAVENOUS
  Administered 2021-03-30: 7 ug/min via INTRAVENOUS
  Administered 2021-03-30: 6 ug/min via INTRAVENOUS
  Administered 2021-03-31: 9 ug/min via INTRAVENOUS
  Filled 2021-03-25 (×18): qty 250

## 2021-03-25 MED ORDER — POTASSIUM CHLORIDE CRYS ER 20 MEQ PO TBCR
20.0000 meq | EXTENDED_RELEASE_TABLET | ORAL | Status: AC
  Administered 2021-03-25 (×2): 20 meq via ORAL
  Filled 2021-03-25 (×2): qty 1

## 2021-03-25 MED ORDER — POTASSIUM CHLORIDE CRYS ER 20 MEQ PO TBCR
40.0000 meq | EXTENDED_RELEASE_TABLET | Freq: Two times a day (BID) | ORAL | Status: DC
  Administered 2021-03-25 – 2021-03-26 (×3): 40 meq via ORAL
  Filled 2021-03-25 (×3): qty 2

## 2021-03-25 MED ORDER — POTASSIUM CHLORIDE 10 MEQ/50ML IV SOLN
10.0000 meq | INTRAVENOUS | Status: AC
  Administered 2021-03-25 (×4): 10 meq via INTRAVENOUS
  Filled 2021-03-25 (×4): qty 50

## 2021-03-25 MED ORDER — LACTATED RINGERS IV BOLUS
1000.0000 mL | Freq: Once | INTRAVENOUS | Status: AC
  Administered 2021-03-25: 1000 mL via INTRAVENOUS

## 2021-03-25 MED ORDER — HYDROCORTISONE SOD SUC (PF) 100 MG IJ SOLR
100.0000 mg | Freq: Three times a day (TID) | INTRAMUSCULAR | Status: DC
  Administered 2021-03-25 – 2021-03-26 (×3): 100 mg via INTRAVENOUS
  Filled 2021-03-25 (×3): qty 2

## 2021-03-25 MED ORDER — GADOBUTROL 1 MMOL/ML IV SOLN
8.0000 mL | Freq: Once | INTRAVENOUS | Status: AC | PRN
  Administered 2021-03-25: 8 mL via INTRAVENOUS

## 2021-03-25 MED ORDER — ALPRAZOLAM 0.25 MG PO TABS: 0.2500 mg | ORAL_TABLET | Freq: Once | ORAL | Status: DC

## 2021-03-25 MED ORDER — VASOPRESSIN 20 UNIT/ML IV SOLN
5.0000 [IU] | Freq: Once | INTRAVENOUS | Status: DC
  Filled 2021-03-25: qty 0.25

## 2021-03-25 NOTE — Progress Notes (Addendum)
eLink Physician-Brief Progress Note Patient Name: Allen Russell DOB: Apr 03, 1961 MRN: [id number]   Date of Service  03/25/2021  HPI/Events of Note  Nursing concerned about polyuria. Urine output about 600 mL/hour which raises concern for DI. Last serum Na+ = 135 at 12:41 PM yesterday.  Last blood glucose = 302 which is elevated, however, should not produce this degree of polyuria.   eICU Interventions  Plan: Send AM BMP now. Serum Osmolarity STAT. Urine Osmolarity STAT.      Intervention Category Major Interventions: Other:  Adiya Selmer Dennard Nip 03/25/2021, 2:07 AM

## 2021-03-25 NOTE — Progress Notes (Signed)
eLink Physician-Brief Progress Note Patient Name: Allen Russell DOB: 1960-11-21 MRN: [id number]   Date of Service  03/25/2021  HPI/Events of Note  Urine osmolality = 122. Serum Osmolality = 293. Given Na+ = 144 and blood glucose now = 131 the diagnosis of DI is established. Etiology central vs nephrogenic DI. Ca++ = 8.6. No history of Lithium use. If urine osmolality is well below that of the serum, it might suggest nephrogenic DI  eICU Interventions  Plan: Empiric trial of DDAVP 4 mcg IV X 1.  Observe effect on urine output.     Intervention Category Major Interventions: Other:  Lenell Antu 03/25/2021, 5:30 AM

## 2021-03-25 NOTE — Progress Notes (Signed)
eLink Physician-Brief Progress Note Patient Name: Allen Russell DOB: [birth year] MRN: [id number]   Date of Service  03/25/2021  HPI/Events of Note  Polyuria persists - Na+ = 144, K+ = 3.1 and Creatinine = 0.52. Clinical picture suggests DI. Etiology?   eICU Interventions  Plan: Increase LR IV infusion to 125 mL/hour. Bolus with LR 1 liter IV over 1 hour now. Replace K+. Await serum and urine Osmolality.        [name] 03/25/2021, 5:15 AM

## 2021-03-25 NOTE — Progress Notes (Signed)
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  Some of this related to inability to afford and take his medications including his metformin and his pancreatic enzymes.  He was brought to the ED 9/19 due to acute on chronic profound weakness and deconditioning superimposed on the above.    His initial evaluation was consistent with shock, metabolic panel consistent with DKA, acute renal insufficiency.  He was treated for DKA and for presumed hypovolemic shock, but remained on norepinephrine infusion even after volume resuscitation and improvement in his metabolic status.  PCCM consulted on 9/21.  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 Failure to Thrive Neuropathy Chronic back pain  Significant Hospital Events:  9/19: [facility] ED 9/20: Admit to inpatient SD 9/21: Begin Levophed infusion, PCCM consulted 9/23: Met diagnostic criteria for DI overnight   Interim History / Subjective:  Severe polyuria overnight with upwards of 635m of urine/hr overnight.  States polyuria has improved since DDAVP dose   Objective   Blood pressure 103/66, pulse (!) 50, temperature (!) 96.7 F (35.9 C), temperature source Axillary, resp. rate 11, height _0  (1.93 m), weight 78.1 kg, SpO2 100 %.        Intake/Output Summary (Last 24 hours) at 03/25/2021 0904 Last data filed at 03/25/2021 0900 Gross per 24 hour  Intake 3889.91 ml  Output 6815 ml  Net -2925.09 ml    Filed Weights    03/21/21 2136 03/24/21 0500 03/25/21 0435  Weight: 78.7 kg 81.7 kg 78.1 kg    Examination: General: Acute deconditioned elderly male lying in bed in NAD  HEENT: [place]/AT, MM pink/moist, PERRL,  Neuro: Alert and oriented x3, non-focal  CV: s1s2 regular rate and rhythm, no murmur, rubs, or gallops,  PULM:  Clear to ascultation, no added breath sounds  GI: soft, bowel sounds active in all 4 quadrants, non-tender, non-distended, tolerating oral diet  Extremities: warm/dry, no edema  Skin: no rashes or lesions  Resolved Hospital Problem list   DKA  Assessment & Plan:  Diabetes Insipidus  -Patient was observed with increased polyuria by afternoon of 9/22 he had diuresed ~5L in the last 24hrs. Workup for DI vs adrenal insufficiency was started. At the time patient urine osmolality was 220 -By late evening of 8/22 urine output had significantly increased to upward of 6048mhr, urine osmolarity was repeated and had dropped to 122 with serum osmolarity 293 leading to the formal diagnoses of DI. Liley central in nature. -Etiology possibly tied to significant anorexia over the last several months but pituitary mass is also possible  P: First dose of DDAVP 9/23 watch urine output closely to determine next DDAVP dosing  Strict intake and output 24hr urine underway  Close monitoring of Sodium levels  Check MRI brain to assess for pituitary masses or lesions  Repeat urine osmolarity  IV and oral hydration  Continue pressors for MAP goal greater than 65  Concern for underlying malignant process  -With severe deconditioning and malnutrition with profound unintentional weight loss  -CA-19 evaluated at  71 -CEA elevated at 14.9 -CT chest ABD and pelvis negative 9/22 P: Follow up on MRI brain  Check PSA  Hx of DM type 2 -Home medication includes Metformin  P: Continue SSI  CBG check s q4 Home metformin remains on hold  Alcohol abuse: Pt reports sober x "few months" ETOH pancreatitis:  pancreatic necrosis s/p surgical necrosectomy and cystogastrostomy (2012),  -Hepatitis panel negative -Lipase WNL P: Continue to supplement thiamin, folate, and multivitamin  Supportive care   Neuropathy P: Gabapentin   Chronic back pain P: Continue home Ultram   Severe protein calorie malnutrition with unintentional weight loss  -BMI 21.9 P: Supplement protein  Encourage oral intake   Best Practice  Diet/type: Regular consistency (see orders) DVT prophylaxis: LMWH GI prophylaxis: N/A Lines: N/A Foley:  N/A Code Status:  full code Last date of multidisciplinary goals of care discussion [03/21/2021]  Critical care:   Performed by: Myleigh Amara D. Harris  Total critical care time: 55 minutes  Critical care time was exclusive of separately billable procedures and treating other patients.  Critical care was necessary to treat or prevent imminent or life-threatening deterioration.  Critical care was time spent personally by me on the following activities: development of treatment plan with patient and/or surrogate as well as nursing, discussions with consultants, evaluation of patient's response to treatment, examination of patient, obtaining history from patient or surrogate, ordering and performing treatments and interventions, ordering and review of laboratory studies, ordering and review of radiographic studies, pulse oximetry and re-evaluation of patient's condition.  Soriya Worster D. Kenton Kingfisher, NP-C [facility] Personal contact information can be found on Amion  03/25/2021, 9:04 AM

## 2021-03-25 NOTE — Progress Notes (Signed)
[facility] ADULT ICU REPLACEMENT PROTOCOL   The patient does apply for the [facility] Adult ICU Electrolyte Replacment Protocol based on the criteria listed below:   1.Exclusion criteria: TCTS patients, ECMO patients and Hypothermia Protocol, and   Dialysis patients 2. Is GFR >/= 30 ml/min? Yes.    Patient's GFR today is >60 3. Is SCr </= 2? Yes.   Patient's SCr is 0.52 mg/dL 4. Did SCr increase >/= 0.5 in 24 hours? No. 5.Pt's weight >40kg  Yes.   6. Abnormal electrolyte(s): K 3.1  7. Electrolytes replaced per protocol   Ardelle Park 03/25/2021 5:10 AM

## 2021-03-25 NOTE — Progress Notes (Signed)
Inpatient Diabetes Program Recommendations  AACE/ADA: New Consensus Statement on Inpatient Glycemic Control (2015)  Target Ranges:  Prepandial:   less than 140 mg/dL      Peak postprandial:   less than 180 mg/dL (1-2 hours)      Critically ill patients:  140 - 180 mg/dL   Lab Results  Component Value Date   GLUCAP 135 (H) 03/25/2021   HGBA1C 11.4 (H) 03/21/2021    Review of Glycemic Control  Diabetes history: DM2 Outpatient Diabetes medications: metformin 250 mg QAM Current orders for Inpatient glycemic control: Semglee 20 QD, Novolog 0-15 units TID with meals and 0-5 HS  HgbA1C - 11.4% Will need to go home on insulin Order insulin pen starter kit  Inpatient Diabetes Program Recommendations:    Add Novolog 4 units TID with meals if eating > 50% Will teach insulin pen administration prior to discharge.   Thank you. Lorenda Peck, RD, LDN, CDE Inpatient Diabetes Coordinator [phone]

## 2021-03-25 NOTE — Progress Notes (Signed)
eLink Physician-Brief Progress Note Patient Name: Allen Russell DOB: [birth year] MRN: [id number]   Date of Service  03/25/2021  HPI/Events of Note  Anxiety - Nursing request for Ativan.  eICU Interventions  Plan: Xanax 0.25 mg PO X 1 now.      Intervention Category Major Interventions: Delirium, psychosis, severe agitation - evaluation and management  Yessenia Maillet Eugene 03/25/2021, 4:50 AM

## 2021-03-25 NOTE — Progress Notes (Signed)
Pt alert and aware in bed. Pt told me about what brought him into the hospital. He said that the team is not sure what is going on with him. But he very happy with the care that he has received so far. He is hopeful to be able to go home soon. He states he is not involved in church. He is spiritual.  The chaplain offered caring and supportive presence and listening ear.

## 2021-03-25 NOTE — Progress Notes (Signed)
eLink Physician-Brief Progress Note Patient Name: Allen Russell DOB: 1960/08/17 MRN: [id number]   Date of Service  03/25/2021  HPI/Events of Note  Nursing reports that EKG complex has widened and BP = 82/50. Patient is already getting a 1 liter LR fluid bolus. Suspect that he is volume depleted given polyuria.  eICU Interventions  Plan: 12 Lead EKG now. Cycle Troponin.     Intervention Category Major Interventions: Hypotension - evaluation and management  Ahmiya Abee Eugene 03/25/2021, 5:27 AM

## 2021-03-26 LAB — GLUCOSE, CAPILLARY
Glucose-Capillary: 276 mg/dL — ABNORMAL HIGH (ref 70–99)
Glucose-Capillary: 291 mg/dL — ABNORMAL HIGH (ref 70–99)
Glucose-Capillary: 383 mg/dL — ABNORMAL HIGH (ref 70–99)
Glucose-Capillary: 313 mg/dL — ABNORMAL HIGH (ref 70–99)

## 2021-03-26 LAB — BASIC METABOLIC PANEL
Anion gap: 8 (ref 5–15)
BUN: 13 mg/dL (ref 6–20)
CO2: 28 mmol/L (ref 22–32)
Calcium: 8.4 mg/dL — ABNORMAL LOW (ref 8.9–10.3)
Chloride: 93 mmol/L — ABNORMAL LOW (ref 98–111)
Creatinine, Ser: 0.68 mg/dL (ref 0.61–1.24)
GFR, Estimated: >60 mL/min (ref >60)
Glucose, Bld: 275 mg/dL — ABNORMAL HIGH (ref 70–99)
Potassium: 3.5 mmol/L (ref 3.5–5.1)
Sodium: 129 mmol/L — ABNORMAL LOW (ref 135–145)

## 2021-03-26 LAB — UREA NITROGEN (UUN), 24 HR URINE
Total Volume: 8275
Urea Nitrogen, 24H Ur: 8 g/24 hr (ref 5–22)
Urea Nitrogen, Ur: 100 mg/dL

## 2021-03-26 LAB — MAGNESIUM: Magnesium: 1.8 mg/dL (ref 1.7–2.4)

## 2021-03-26 MED ORDER — INSULIN GLARGINE-YFGN 100 UNIT/ML ~~LOC~~ SOLN
25.0000 [IU] | Freq: Two times a day (BID) | SUBCUTANEOUS | Status: DC
  Administered 2021-03-26 – 2021-03-30 (×9): 25 [IU] via SUBCUTANEOUS
  Filled 2021-03-26 (×10): qty 0.25

## 2021-03-26 MED ORDER — MELATONIN 5 MG PO TABS
10.0000 mg | ORAL_TABLET | Freq: Once | ORAL | Status: AC
  Administered 2021-03-26: 10 mg via ORAL
  Filled 2021-03-26: qty 2

## 2021-03-26 MED ORDER — HYDROCORTISONE SOD SUC (PF) 100 MG IJ SOLR
50.0000 mg | Freq: Two times a day (BID) | INTRAMUSCULAR | Status: DC
  Administered 2021-03-26 – 2021-03-28 (×4): 50 mg via INTRAVENOUS
  Filled 2021-03-26 (×4): qty 2

## 2021-03-26 MED ORDER — GUAIFENESIN-DM 100-10 MG/5ML PO SYRP
5.0000 mL | ORAL_SOLUTION | ORAL | Status: DC | PRN
  Administered 2021-03-26: 5 mL via ORAL
  Filled 2021-03-26: qty 10

## 2021-03-26 NOTE — Progress Notes (Signed)
eLink Physician-Brief Progress Note Patient Name: Allen Russell DOB: 1960/08/10 MRN: [id number]   Date of Service  03/26/2021  HPI/Events of Note  Asking for 10 mg melatonin for sleep.   eICU Interventions  Ordered once.     Intervention Category Minor Interventions: Other:  Ranee Gosselin 03/26/2021, 10:06 PM

## 2021-03-26 NOTE — Progress Notes (Signed)
Inpatient Diabetes Program Recommendations  AACE/ADA: New Consensus Statement on Inpatient Glycemic Control   Target Ranges:  Prepandial:   less than 140 mg/dL      Peak postprandial:   less than 180 mg/dL (1-2 hours)      Critically ill patients:  140 - 180 mg/dL  Results for Allen Russell, Allen Russell (MRN [id number]) as of 03/26/2021 06:30  Ref. Range 03/26/2021 05:00  Glucose Latest Ref Range: 70 - 99 mg/dL 315 (H)   Results for Allen Russell, Allen Russell (MRN [id number]) as of 03/26/2021 06:30  Ref. Range 03/25/2021 07:49 03/25/2021 11:48 03/25/2021 16:13 03/25/2021 16:16 03/25/2021 17:42 03/25/2021 21:52  Glucose-Capillary Latest Ref Range: 70 - 99 mg/dL 509 (H) 326 (H) 712 (H) 381 (H) 350 (H) 319 (H)    Review of Glycemic Control  Diabetes history: DM2 Outpatient Diabetes medications: Metformin XR 250 mg QAM Current orders for Inpatient glycemic control: Semglee 15 units BID, Novolog 4 units TID with meals, Novolog 0-15 units TID with meals, Novolog 0-5 units QHS; Novolog 4 units TID with meals meals, Solucortef 100 mg Q8H  Inpatient Diabetes Program Recommendations:    Insulin: If steroids are continued, please consider increasing Semglee to 20 units BID and if post prandial glucose remains consistently over 180 mg/dl, please increase meal coverage to Novolog 7 units TID with meals if patient eats at least 50% of meals.  Thanks, Orlando Penner, RN, MSN, CDE Diabetes Coordinator Inpatient Diabetes Program [phone] (Team Pager from 8am to 5pm)

## 2021-03-26 NOTE — Progress Notes (Signed)
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  Some of this related to inability to afford and take his medications including his metformin and his pancreatic enzymes.  He was brought to the ED 9/19 due to acute on chronic profound weakness and deconditioning superimposed on the above.    His initial evaluation was consistent with shock, metabolic panel consistent with DKA, acute renal insufficiency.  He was treated for DKA and for presumed hypovolemic shock, but remained on norepinephrine infusion even after volume resuscitation and improvement in his metabolic status.  PCCM consulted on 9/21.  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 Failure to Thrive Neuropathy Chronic back pain  Significant Hospital Events:  9/19: [facility] ED 9/20: Admit to inpatient SD 9/21: Begin Levophed infusion, PCCM consulted 9/23 tentative dx DI      Scheduled Meds:  (feeding supplement) PROSource Plus  30 mL Oral BID BM   ALPRAZolam  0.25 mg Oral Once   Chlorhexidine Gluconate Cloth  6 each Topical Daily   enoxaparin (LOVENOX) injection  40 mg Subcutaneous Q24H   feeding supplement (GLUCERNA SHAKE)  237 mL Oral BID BM   folic acid  1 mg Oral Daily   gabapentin  600 mg Oral BID   hydrocortisone sod succinate (SOLU-CORTEF) inj  100 mg Intravenous Q8H   influenza vac split quadrivalent PF  0.5 mL Intramuscular Tomorrow-1000   insulin aspart  0-15 Units Subcutaneous TID WC   insulin  aspart  0-5 Units Subcutaneous QHS   insulin aspart  4 Units Subcutaneous TID WC   insulin glargine-yfgn  15 Units Subcutaneous BID   lipase/protease/amylase  36,000 Units Oral TID WC   mouth rinse  15 mL Mouth Rinse BID   multivitamin with minerals  1 tablet Oral Daily   potassium chloride  40 mEq Oral BID   sodium chloride flush  10-40 mL Intracatheter Q12H   thiamine  100 mg Oral Daily   Continuous Infusions:  sodium chloride Stopped (03/24/21 1258)   lactated ringers 75 mL/hr at 03/26/21 0801   norepinephrine (LEVOPHED) Adult infusion 7 mcg/min (03/26/21 0801)   PRN Meds:.acetaminophen, dextrose, docusate sodium, guaiFENesin-dextromethorphan, lipase/protease/amylase, ondansetron (ZOFRAN) IV, ondansetron, polyethylene glycol, sodium chloride flush, traMADol  Interim History / Subjective:  Looks great, feels great but still levophed dep and neg I/0s    Objective   Blood pressure (!) 86/55, pulse 65, temperature 98.4 F (36.9 C), temperature source Oral, resp. rate 10, height 6\' 4"  (1.93 m), weight 83.4 kg, SpO2 97 %.        Intake/Output Summary (Last 24 hours) at 03/26/2021 0914 Last data filed at 03/26/2021 0801 Gross per 24 hour  Intake 3530.08 ml  Output 5075 ml  Net -1544.92 ml   Filed Weights   03/24/21 0500 03/25/21 0435 03/26/21 0500  Weight: 81.7 kg 78.1 kg 83.4 kg      Examination: Tmax  98.4  General appearance:    comfortable lying flat   At Rest 02 sats  96% on RA  No jvd  Oropharynx clear,  mucosa nl Neck supple Lungs with a few scattered exp > insp rhonchi bilaterally RRR no s3 or or sign murmur Abd soft/ nl  excursion  Extr warm with no edema or clubbing noted Neuro  Sensorium intact,  no apparent motor deficits   Resolved Hospital Problem list   DKA  Assessment & Plan:  Diabetes Insipidus  -Patient was observed with increased polyuria by afternoon of 9/22 he had diuresed ~5L in the last 24hrs. Workup for DI vs adrenal insufficiency was started.  At the time patient urine osmolality was 220 -By late evening of 8/22 urine output had significantly increased to upward of 621ml/hr, urine osmolarity was repeated and had dropped to 122 with serum osmolarity 293 leading to the formal diagnoses of DI. Liley central in nature. -Etiology possibly tied to significant anorexia over the last several months but pituitary mass is also possible  P: First dose of DDAVP 9/23 watch urine output closely to determine next DDAVP dosing though Na dropping makes the use of DDAVP  problematic so for now just try to control sugars better and vol expand with LR  Strict intake and output 24hr urine underway  Close monitoring of Sodium levels  IV and oral hydration  Continue pressors for MAP goal greater than 65  Concern for underlying malignant process  -With severe deconditioning and malnutrition with profound unintentional weight loss  -CA-19 elavated at 69 -CEA elevated at 14.9 - PSA only 4.07  -CT chest ABD and pelvis negative 9/22 P: no further w/u planned    Hx of DM type 2 -Home medication includes Metformin  P: Continue SSI  CBG check s q4 Home metformin remains on hold  Alcohol abuse: Pt reports sober x "few months" 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),  -Hepatitis panel negative -Lipase WNL P: Continue to supplement thiamin, folate, and multivitamin  Supportive care   Neuropathy P: Gabapentin   Chronic back pain P: Continue home Ultram   Severe protein calorie malnutrition with unintentional weight loss  -BMI 21.9 P: Supplement protein  Encourage oral intake   Best Practice  Diet/type: Regular consistency (see orders) DVT prophylaxis: LMWH GI prophylaxis: N/A Lines: N/A Foley:  N/A Code Status:  full code Last date of multidisciplinary goals of care discussion [03/21/2021]    Allen Hughs, MD Pulmonary and Critical Care Medicine [facility] Cell  [phone]   After 7:00 pm call Elink  [phone]

## 2021-03-26 NOTE — Progress Notes (Signed)
Patient had an 8 beat run of vtach with chest pain, MD notified, no new orders at this time.  Margarito Liner, RN 03/26/21 5:28

## 2021-03-26 NOTE — Plan of Care (Signed)
  Problem: Urinary Elimination: Goal: Ability to achieve and maintain adequate renal perfusion and functioning will improve Outcome: Progressing   Problem: Activity: Goal: Risk for activity intolerance will decrease Outcome: Progressing   Problem: Cardiac: Goal: Ability to maintain an adequate cardiac output will improve Outcome: Not Progressing   Problem: Fluid Volume: Goal: Ability to achieve a balanced intake and output will improve Outcome: Not Progressing

## 2021-03-27 LAB — GLUCOSE, CAPILLARY
Glucose-Capillary: 228 mg/dL — ABNORMAL HIGH (ref 70–99)
Glucose-Capillary: 256 mg/dL — ABNORMAL HIGH (ref 70–99)
Glucose-Capillary: 249 mg/dL — ABNORMAL HIGH (ref 70–99)
Glucose-Capillary: 203 mg/dL — ABNORMAL HIGH (ref 70–99)

## 2021-03-27 LAB — BASIC METABOLIC PANEL
Anion gap: 9 (ref 5–15)
BUN: 15 mg/dL (ref 6–20)
CO2: 30 mmol/L (ref 22–32)
Calcium: 8.8 mg/dL — ABNORMAL LOW (ref 8.9–10.3)
Chloride: 103 mmol/L (ref 98–111)
Creatinine, Ser: 0.87 mg/dL (ref 0.61–1.24)
GFR, Estimated: >60 mL/min (ref >60)
Glucose, Bld: 271 mg/dL — ABNORMAL HIGH (ref 70–99)
Potassium: 3.3 mmol/L — ABNORMAL LOW (ref 3.5–5.1)
Sodium: 142 mmol/L (ref 135–145)

## 2021-03-27 MED ORDER — MIDODRINE HCL 5 MG PO TABS
10.0000 mg | ORAL_TABLET | Freq: Three times a day (TID) | ORAL | Status: DC
  Administered 2021-03-27 – 2021-03-30 (×9): 10 mg via ORAL
  Filled 2021-03-27 (×9): qty 2

## 2021-03-27 MED ORDER — INSULIN ASPART 100 UNIT/ML IJ SOLN
6.0000 [IU] | Freq: Three times a day (TID) | INTRAMUSCULAR | Status: DC
  Administered 2021-03-27 – 2021-03-30 (×10): 6 [IU] via SUBCUTANEOUS

## 2021-03-27 MED ORDER — MELATONIN 5 MG PO TABS
10.0000 mg | ORAL_TABLET | Freq: Once | ORAL | Status: AC | PRN
  Administered 2021-03-27: 10 mg via ORAL
  Filled 2021-03-27: qty 2

## 2021-03-27 MED ORDER — POTASSIUM CHLORIDE CRYS ER 20 MEQ PO TBCR
40.0000 meq | EXTENDED_RELEASE_TABLET | Freq: Three times a day (TID) | ORAL | Status: DC
  Administered 2021-03-27 – 2021-04-04 (×26): 40 meq via ORAL
  Filled 2021-03-27 (×26): qty 2

## 2021-03-27 NOTE — Progress Notes (Signed)
eLink Physician-Brief Progress Note Patient Name: Allen Russell DOB: 04/13/1961 MRN: [id number]   Date of Service  03/27/2021  HPI/Events of Note  Patient states he takes 12 mg melatonin for sleep at home and is asking for it to be ordered  eICU Interventions  Melatonin 10 mg prn ordered     Intervention Category Minor Interventions: Routine modifications to care plan (e.g. PRN medications for pain, fever)  [name] Donica Derouin 03/27/2021, 10:31 PM

## 2021-03-27 NOTE — Plan of Care (Signed)

## 2021-03-27 NOTE — Progress Notes (Signed)
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  Some of this related to inability to afford and take his medications including his metformin and his pancreatic enzymes.  He was brought to the ED 9/19 due to acute on chronic profound weakness and deconditioning superimposed on the above.    His initial evaluation was consistent with shock, metabolic panel consistent with DKA, acute renal insufficiency.  He was treated for DKA and for presumed hypovolemic shock, but remained on norepinephrine infusion even after volume resuscitation and improvement in his metabolic status.  PCCM consulted on 9/21.  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 Failure to Thrive Neuropathy Chronic back pain  Significant Hospital Events:  9/19: [facility] ED 9/20: Admit to inpatient SD 9/21: Begin Levophed infusion, PCCM consulted 9/23 tentative dx DI   9/25 rec wean levophed for mean of 50 systemic as long as no symptoms as ambulating on levophed drip    Scheduled Meds:  (feeding supplement) PROSource Plus  30 mL Oral BID BM   ALPRAZolam  0.25 mg Oral Once   Chlorhexidine Gluconate Cloth  6 each Topical Daily   enoxaparin (LOVENOX) injection  40 mg Subcutaneous Q24H   feeding supplement (GLUCERNA SHAKE)  237 mL Oral BID BM   folic acid  1 mg Oral Daily   gabapentin  600 mg Oral BID   hydrocortisone sod succinate (SOLU-CORTEF) inj  50 mg Intravenous Q12H   influenza vac split quadrivalent PF   0.5 mL Intramuscular Tomorrow-1000   insulin aspart  0-15 Units Subcutaneous TID WC   insulin aspart  0-5 Units Subcutaneous QHS   insulin aspart  4 Units Subcutaneous TID WC   insulin glargine-yfgn  25 Units Subcutaneous BID   lipase/protease/amylase  36,000 Units Oral TID WC   mouth rinse  15 mL Mouth Rinse BID   multivitamin with minerals  1 tablet Oral Daily   potassium chloride  40 mEq Oral BID   sodium chloride flush  10-40 mL Intracatheter Q12H   thiamine  100 mg Oral Daily   Continuous Infusions:  sodium chloride Stopped (03/24/21 1258)   lactated ringers 150 mL/hr at 03/27/21 0700   norepinephrine (LEVOPHED) Adult infusion 14 mcg/min (03/27/21 0700)   PRN Meds:.acetaminophen, dextrose, docusate sodium, guaiFENesin-dextromethorphan, lipase/protease/amylase, ondansetron (ZOFRAN) IV, ondansetron, polyethylene glycol, sodium chloride flush, traMADol   Interim History / Subjective:  Feels great, good po intake, no sob and actually walking in room on levophed drip  Objective   Blood pressure (!) 105/56, pulse (!) 50, temperature (!) 97.5 F (36.4 C), temperature source Oral, resp. rate 13, height 6\' 4"  (1.93 m), weight 83.4 kg, SpO2 97 %.        Intake/Output Summary (Last 24 hours) at 03/27/2021 0851 Last data filed at 03/27/2021 0700 Gross per 24 hour  Intake 5762.05 ml  Output 4550 ml  Net 1212.05 ml   Filed Weights   03/24/21 0500 03/25/21 0435 03/26/21 0500  Weight: 81.7 kg 78.1 kg 83.4 kg      Examination: Tmax  98.8 General appearance:    chronically ill appearing but cheerful, not toxic at all   At Rest 02 sats  94% on RA  No jvd Oropharynx clear,  mucosa nl Neck supple Lungs clear bilaterally RRR no s3 or or sign murmur Abd mildly tender diffusely, no guarding or rebound  Extr warm with no edema or clubbing noted Neuro  Sensorium intact,  no apparent motor deficits   Resolved Hospital Problem list   DKA  Assessment & Plan:  Diabetes Insipidus   -Patient was observed with increased polyuria by afternoon of 9/22 he had diuresed ~5L in the last 24hrs. Workup for DI vs adrenal insufficiency was started. At the time patient urine osmolality was 220 -By late evening of 8/22 urine output had significantly increased to upward of 66ml/hr, urine osmolarity was repeated and had dropped to 122 with serum osmolarity 293 leading to the formal diagnosis of DI.  ?  central in nature.   P: First dose  DDAVP 9/23  Na dropping makes the use of DDAVP  problematic so rec cont vol exp with LR and wean off levophed for systemic bp mean > 50 as long as no symtpoms Strict intake and output  Concern for underlying malignant process  -With severe deconditioning and malnutrition with profound unintentional weight loss  -CA-19 elavated at 69 -CEA elevated at 14.9 - PSA only 4.07  -CT chest ABD and pelvis negative 9/22 P: no further w/u planned as inpt     Hx of DM type 2 -Home medication includes Metformin  P: Continue SSI  CBG check s q4 Home metformin remains on hold Needs tight control to prevent osmotic diuresis superimposed on ? 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),  -Hepatitis panel negative -Lipase WNL P: Continue to supplement thiamin, folate, and multivitamin  Supportive care   Neuropathy P: Gabapentin   Chronic back pain P: Continue home Ultram   Severe protein calorie malnutrition with unintentional weight loss  -BMI 21.9 P: Supplement protein  Encourage oral intake   Best Practice  Diet/type: Regular consistency (see orders) DVT prophylaxis: LMWH GI prophylaxis: N/A Lines: N/A Foley:  N/A Code Status:  full code Last date of multidisciplinary goals of care discussion [03/21/2021]      Sandrea Hughs, MD Pulmonary and Critical Care Medicine [facility] Cell [phone]   After 7:00 pm call Elink   [phone]

## 2021-03-27 NOTE — Progress Notes (Signed)
Patient currently on of levophed. Patient's current BP is 111/66. Patient states that his BP usually runs on the lower side. This RN and CCMD at bedside. Per Dr. Sherene Sires, MAP goal for this patient is >50.

## 2021-03-27 NOTE — Progress Notes (Signed)
This RN has been titrating levophed drip per titration orders. Patient BP now 54/32. Patient states that he sat on the side of bed to urinate. Patient states that he felt slight dizziness but it quickly went away.This RN increased levophed by titration instructions. Patient is asymptomatic at this time. Patient is  A/Ox4, O2 saturations is 97% on RA, HR is 60. Repeat BP is 92/59.

## 2021-03-28 LAB — BASIC METABOLIC PANEL
Anion gap: 5 (ref 5–15)
BUN: 18 mg/dL (ref 6–20)
CO2: 31 mmol/L (ref 22–32)
Calcium: 8.6 mg/dL — ABNORMAL LOW (ref 8.9–10.3)
Chloride: 105 mmol/L (ref 98–111)
Creatinine, Ser: 0.78 mg/dL (ref 0.61–1.24)
GFR, Estimated: >60 mL/min (ref >60)
Glucose, Bld: 169 mg/dL — ABNORMAL HIGH (ref 70–99)
Potassium: 3.4 mmol/L — ABNORMAL LOW (ref 3.5–5.1)
Sodium: 141 mmol/L (ref 135–145)

## 2021-03-28 LAB — GLUCOSE, CAPILLARY
Glucose-Capillary: 127 mg/dL — ABNORMAL HIGH (ref 70–99)
Glucose-Capillary: 164 mg/dL — ABNORMAL HIGH (ref 70–99)
Glucose-Capillary: 235 mg/dL — ABNORMAL HIGH (ref 70–99)
Glucose-Capillary: 87 mg/dL (ref 70–99)

## 2021-03-28 LAB — CBC
HCT: 29.6 % — ABNORMAL LOW (ref 39.0–52.0)
Hemoglobin: 9.9 g/dL — ABNORMAL LOW (ref 13.0–17.0)
MCH: 31.8 pg (ref 26.0–34.0)
MCHC: 33.4 g/dL (ref 30.0–36.0)
MCV: 95.2 fL (ref 80.0–100.0)
Platelets: 165 10*3/uL (ref 150–400)
RBC: 3.11 MIL/uL — ABNORMAL LOW (ref 4.22–5.81)
RDW: 17.1 % — ABNORMAL HIGH (ref 11.5–15.5)
WBC: 7.1 10*3/uL (ref 4.0–10.5)
nRBC: 0 % (ref 0.0–0.2)

## 2021-03-28 MED ORDER — MELATONIN 5 MG PO TABS
10.0000 mg | ORAL_TABLET | Freq: Once | ORAL | Status: AC | PRN
  Administered 2021-03-28: 10 mg via ORAL
  Filled 2021-03-28: qty 2

## 2021-03-28 MED ORDER — HYDROCORTISONE SOD SUC (PF) 100 MG IJ SOLR
50.0000 mg | Freq: Every day | INTRAMUSCULAR | Status: AC
  Administered 2021-03-29: 50 mg via INTRAVENOUS
  Filled 2021-03-28: qty 2

## 2021-03-28 MED ORDER — INFLUENZA VAC SPLIT QUAD 0.5 ML IM SUSY
0.5000 mL | PREFILLED_SYRINGE | INTRAMUSCULAR | Status: AC
  Administered 2021-03-29: 0.5 mL via INTRAMUSCULAR
  Filled 2021-03-28: qty 0.5

## 2021-03-28 NOTE — Progress Notes (Signed)
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  Some of this related to inability to afford and take his medications including his metformin and his pancreatic enzymes.  He was brought to the ED 9/19 due to acute on chronic profound weakness and deconditioning superimposed on the above.    His initial evaluation was consistent with shock, metabolic panel consistent with DKA, acute renal insufficiency.  He was treated for DKA and for presumed hypovolemic shock, but remained on norepinephrine infusion even after volume resuscitation and improvement in his metabolic status.  PCCM consulted on 9/21.  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 Failure to Thrive Neuropathy Chronic back pain  Significant Hospital Events:  9/19: [facility] ED 9/20: Admit to inpatient SD 9/21: Begin Levophed infusion, PCCM consulted 9/23 tentative dx DI   9/25 rec wean levophed for mean of 50 systemic as long as no symptoms as ambulating on levophed drip, midodrine started 9/26 Levophed weaning slowly   Interim History / Subjective:  Sleep this morning.  Feeling okay.  Objective   Blood pressure (!) 90/45, pulse (!) 52, temperature (!) 97.5 F (36.4 C), temperature source Axillary, resp. rate 19, height 6\' 4"  (1.93 m), weight 98.5 kg, SpO2 96 %.        Intake/Output Summary (Last 24 hours) at 03/28/2021 1154 Last data filed at 03/28/2021 1103 Gross per 24 hour  Intake 4475.49 ml  Output 5375  ml  Net -899.51 ml    Filed Weights   03/25/21 0435 03/26/21 0500 03/28/21 0500  Weight: 78.1 kg 83.4 kg 98.5 kg      Examination: BP (!) 90/45   Pulse (!) 52   Temp (!) 97.5 F (36.4 C) (Axillary)   Resp 19   Ht 6\' 4"  (1.93 m)   Wt 98.5 kg   SpO2 96%   BMI 26.43 kg/m  General: Lying in bed, no acute distress Eyes: EOMI, no icterus Cardiovascular regular in rhythm, no murmur Pulmonary: Clear, normal work of breathing Extremities: Warm, no edema  Resolved Hospital Problem list   DKA  Assessment & Plan:   Hypotension: Blood pressures run low.  Query if pooling splanchnic circulation in the setting of chronic pancreatitis, ascites was seen on CT as recorded possible underlying cirrhosis given his history of alcohol abuse. --SBP goal 85, midodrine 10 mg 3 times daily started 9/25 -- Wean nor epi as tolerated, consider increasing midodrine dose in future  Acute kidney injury (resolved): Creatinine elevated on arrival.  Likely hypovolemic in the setting of DKA.  Had robust urine output on the following days.  Some concern for DI.  Got DDAVP times once.  Would not make sense to have situational or intermittent DI.  Most likely post ATN diuresis.  Head imaging clear.  Creatinine normalized.  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),  --Hepatitis panel negative --Lipase WNL --Continue to supplement thiamine, folate,  and multivitamin   Neuropathy --Gabapentin   Severe protein calorie malnutrition with unintentional weight loss  --BMI 21.9 --Supplement protein  --Encourage oral intake   Best Practice  Diet/type: Regular consistency (see orders) DVT prophylaxis: LMWH GI prophylaxis: N/A Lines: N/A Foley:  N/A Code Status:  full code Last date of multidisciplinary goals of care discussion  [03/21/2021]     CRITICAL CARE Performed by: Karren Burly   Total critical care time: 33 minutes  Critical care time was exclusive of separately billable procedures and treating other patients.  Critical care was necessary to treat or prevent imminent or life-threatening deterioration.  Critical care was time spent personally by me on the following activities: development of treatment plan with patient and/or surrogate as well as nursing, discussions with consultants, evaluation of patient's response to treatment, examination of patient, obtaining history from patient or surrogate, ordering and performing treatments and interventions, ordering and review of laboratory studies, ordering and review of radiographic studies, pulse oximetry and re-evaluation of patient's condition.  Karren Burly, MD See Anion for contact info

## 2021-03-28 NOTE — Progress Notes (Signed)
eLink Physician-Brief Progress Note Patient Name: Allen Russell DOB: April 26, 1961 MRN: [id number]   Date of Service  03/28/2021  HPI/Events of Note  Patient requesting melatonin for sleep Melatonin 10 mg given last night with good response  eICU Interventions  Melatonin 10 mg PO reordered     Intervention Category Minor Interventions: Routine modifications to care plan (e.g. PRN medications for pain, fever)  [name] [name] 03/28/2021, 9:55 PM

## 2021-03-29 ENCOUNTER — Inpatient Hospital Stay (HOSPITAL_COMMUNITY): Payer: Self-pay

## 2021-03-29 LAB — OSMOLALITY: Osmolality: 290 mOsm/kg (ref 275–295)

## 2021-03-29 LAB — GLUCOSE, CAPILLARY
Glucose-Capillary: 134 mg/dL — ABNORMAL HIGH (ref 70–99)
Glucose-Capillary: 95 mg/dL (ref 70–99)
Glucose-Capillary: 193 mg/dL — ABNORMAL HIGH (ref 70–99)
Glucose-Capillary: 89 mg/dL (ref 70–99)
Glucose-Capillary: 220 mg/dL — ABNORMAL HIGH (ref 70–99)

## 2021-03-29 LAB — CULTURE, BLOOD (ROUTINE X 2)
Culture: NO GROWTH
Culture: NO GROWTH
Special Requests: ADEQUATE
Special Requests: ADEQUATE

## 2021-03-29 LAB — COOXEMETRY PANEL
Carboxyhemoglobin: 0.7 % (ref 0.5–1.5)
Methemoglobin: 2 % — ABNORMAL HIGH (ref 0.0–1.5)
O2 Saturation: 67.4 %
Total hemoglobin: 10.7 g/dL — ABNORMAL LOW (ref 12.0–16.0)

## 2021-03-29 MED ORDER — GABAPENTIN 300 MG PO CAPS
300.0000 mg | ORAL_CAPSULE | Freq: Two times a day (BID) | ORAL | Status: DC
  Administered 2021-03-29 – 2021-04-07 (×18): 300 mg via ORAL
  Filled 2021-03-29 (×18): qty 1

## 2021-03-29 MED ORDER — HYDROCORTISONE SOD SUC (PF) 100 MG IJ SOLR
100.0000 mg | Freq: Three times a day (TID) | INTRAMUSCULAR | Status: AC
Start: 2021-03-29 — End: 2021-03-29
  Administered 2021-03-29: 100 mg via INTRAVENOUS
  Filled 2021-03-29: qty 2

## 2021-03-29 MED ORDER — MELATONIN 5 MG PO TABS
10.0000 mg | ORAL_TABLET | Freq: Every evening | ORAL | Status: DC | PRN
  Administered 2021-03-30 – 2021-04-06 (×9): 10 mg via ORAL
  Filled 2021-03-29 (×9): qty 2

## 2021-03-29 MED ORDER — MELATONIN 3 MG PO TABS: 3.0000 mg | ORAL_TABLET | Freq: Every evening | ORAL | Status: DC | PRN

## 2021-03-29 NOTE — Progress Notes (Signed)
Patient will be NPO until ultrasound is completed. Primary team is aware of the NPO. Patient has been NPO approximately 1300. No new orders at this time. Will continue to monitor.

## 2021-03-29 NOTE — Consult Note (Signed)
Reason for Consult: Polyuria Referring Physician: Heber [place] MD (CCM)   HPI:  60 year old Caucasian man with past history significant for diabetes mellitus complicated by neuropathy, a complex history of alcohol abuse with alcoholic pancreatitis/pancreatic necrosis requiring surgical necrosectomy/cystogastrostomy in 2012 with disconnected gut syndrome who presented to the hospital 8 days ago with weakness, weight loss and exertional dyspnea that had been increasing over the past 2 to 3 months.  He was found to be in diabetic ketoacidosis and had acute kidney injury with significant volume depletion/orthostatic hypotension.  He has had progressive improvement of renal function with creatinine that appears to be now down to 0.6-0.8 with concerns raised by fluctuating sodium levels and polyuria.  He has had some intermittent hyperglycemia and does not have hypernatremia.  He had a cosyntropin stimulation test on 9/23 while on stress dose hydrocortisone that is difficult to interpret.  Prior to hospitalization, he reports that he was drinking at least a gallon of water a day and over the past 2 or 3 days while here in the hospital has noticed some pedal/arm edema which he has never had before.  He remains on Levophed drip for hypotension.  Past Medical History:  Diagnosis Date   Diabetes mellitus without complication (HCC)     Past Surgical History:  Procedure Laterality Date   APPENDECTOMY     BACK SURGERY     CHOLECYSTECTOMY     HERNIA REPAIR      Family History  Problem Relation Age of Onset   Breast cancer Mother    Thyroid disease Mother    Breast cancer Sister    Pancreatic cancer Maternal Grandmother    Ovarian cancer Maternal Grandmother    Hypertension Paternal Grandfather     Social History:  reports that he has never smoked. He has never used smokeless tobacco. No history on file for alcohol use and drug use.  Allergies:  Allergies  Allergen Reactions   Morphine Other  (See Comments)    headache      Medications: I have reviewed the patient's current medications. Scheduled:  (feeding supplement) PROSource Plus  30 mL Oral BID BM   ALPRAZolam  0.25 mg Oral Once   Chlorhexidine Gluconate Cloth  6 each Topical Daily   enoxaparin (LOVENOX) injection  40 mg Subcutaneous Q24H   feeding supplement (GLUCERNA SHAKE)  237 mL Oral BID BM   folic acid  1 mg Oral Daily   gabapentin  300 mg Oral BID   hydrocortisone sod succinate (SOLU-CORTEF) inj  100 mg Intravenous Q8H   insulin aspart  0-15 Units Subcutaneous TID WC   insulin aspart  0-5 Units Subcutaneous QHS   insulin aspart  6 Units Subcutaneous TID WC   insulin glargine-yfgn  25 Units Subcutaneous BID   lipase/protease/amylase  36,000 Units Oral TID WC   mouth rinse  15 mL Mouth Rinse BID   midodrine  10 mg Oral TID WC   multivitamin with minerals  1 tablet Oral Daily   potassium chloride  40 mEq Oral TID   sodium chloride flush  10-40 mL Intracatheter Q12H   thiamine  100 mg Oral Daily    BMP Latest Ref Rng & Units 03/28/2021 03/27/2021 03/26/2021  Glucose 70 - 99 mg/dL 259(D) 638(V) 564(P)  BUN 6 - 20 mg/dL 18 15 13   Creatinine 0.61 - 1.24 mg/dL 3.29 5.18  Sodium 135 - 145 mmol/L 141 142 129(L)  Potassium 3.5 - 5.1 mmol/L 3.4(L) 3.3(L) 3.5  Chloride 98 -  111 mmol/L 105 103 93(L)  CO2 22 - 32 mmol/L 31 30 28   Calcium 8.9 - 10.3 mg/dL ) 7.9(K) 2.4(O)   CBC Latest Ref Rng & Units 03/28/2021 03/25/2021 03/23/2021  WBC 4.0 - 10.5 K/uL 7.1 5.1 4.7  Hemoglobin 13.0 - 17.0 g/dL 03/25/2021) 11.0(L) 11.6(L)  Hematocrit 39.0 - 52.0 % 29.6(L) 31.0(L) 30.9(L)  Platelets 150 - 400 K/uL 165 184 189     No results found.  Review of Systems  Constitutional:  Positive for activity change, appetite change and fatigue. Negative for chills and fever.  HENT:  Negative for nosebleeds, sore throat and trouble swallowing.   Eyes:  Negative for redness and visual disturbance.  Respiratory:  Negative for chest  tightness and shortness of breath.   Cardiovascular:  Positive for leg swelling. Negative for chest pain.  Gastrointestinal:  Negative for abdominal pain, blood in stool, diarrhea, nausea and vomiting.  Endocrine: Positive for polydipsia and polyuria. Negative for cold intolerance.  Genitourinary:  Positive for dysuria. Negative for difficulty urinating, frequency, hematuria and urgency.  Musculoskeletal:  Negative for arthralgias, back pain and joint swelling.  Skin:  Negative for rash and wound.  Neurological:  Positive for weakness. Negative for dizziness and light-headedness.  Blood pressure (!) 123/46, pulse 74, temperature 98.3 F (36.8 C), temperature source Axillary, resp. rate (!) 21, height 6\' 4"  (1.93 m), weight 98.5 kg, SpO2 94 %. Physical Exam Vitals and nursing note reviewed.  Constitutional:      Appearance: He is well-developed and normal weight. He is not ill-appearing.  HENT:     Head: Normocephalic and atraumatic.     Mouth/Throat:     Mouth: Mucous membranes are moist.     Pharynx: Oropharynx is clear.  Eyes:     Extraocular Movements: Extraocular movements intact.  Neck:     Vascular: No JVD.  Cardiovascular:     Rate and Rhythm: Normal rate and regular rhythm.     Pulses: Normal pulses.     Heart sounds: No murmur heard.   No gallop.  Pulmonary:     Effort: Pulmonary effort is normal.     Breath sounds: Normal breath sounds.  Chest:     Chest wall: No edema.  Abdominal:     General: Bowel sounds are normal.     Palpations: Abdomen is soft.  Musculoskeletal:        General: Normal range of motion.     Cervical back: Normal range of motion and neck supple.     Right lower leg: Edema present.     Left lower leg: Edema present.     Comments: Trace lower extremity edema  Skin:    General: Skin is warm and dry.     Coloration: Skin is pale.  Neurological:     General: No focal deficit present.     Mental Status: He is alert and oriented to person, place,  and time.  Psychiatric:        Mood and Affect: Mood normal.    Assessment/Plan: 1.  Polyuria: Suspect that this is primarily a solute diuresis from recent acute kidney injury/hyperglycemia in the setting of recovery from acute kidney injury with possibly impaired free water handling.  I will check urine osmolality to further work-up the etiology and limit oral fluid intake to 2 L a day. 2.  Hypotension: Difficult to interpret the recent cosyntropin stimulation test in the setting of ongoing hydrocortisone treatment and would recommend transient use of fludrocortisone in addition to  midodrine to see if we can get him off of Levophed drip. 3.  Diabetic ketoacidosis: This appears to have resolved with correction of his multiple electrolyte abnormalities and acute kidney injury. 4.  Hypokalemia: Mild and secondary to polyuria, agree with the ongoing oral replacement.  Allen Russell K. 03/29/2021, 1:17 PM

## 2021-03-29 NOTE — Progress Notes (Signed)
Ted hoses (below knee) ordered and placed on patient as per provider. Will continue to monitor.

## 2021-03-29 NOTE — TOC Progression Note (Signed)
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  Some of this related to inability to afford and take his medications including his metformin and his pancreatic enzymes.  He was brought to the ED 9/19 due to acute on chronic profound weakness and deconditioning superimposed on the above.     His initial evaluation was consistent with shock, metabolic panel consistent with DKA, acute renal insufficiency.  He was treated for DKA and for presumed hypovolemic shock, but remained on norepinephrine infusion even after volume resuscitation and improvement in his metabolic status.  PCCM consulted on 9/21.   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 Failure to Thrive Neuropathy Chronic back pain   Significant Hospital Events:  9/19: [facility] ED 9/20: Admit to inpatient SD 9/21: Begin Levophed infusion, PCCM consulted 9/23 tentative dx DI   9/25 rec wean levophed for mean of 50 systemic as long as no symptoms as ambulating on levophed drip, midodrine started 9/26 Levophed weaning slowly  toc plan of care:  bp running 74/46, iv levophed running, room air, following for progression and toc needs.  Disposition is to return to home.   Expected Discharge Plan: Home/Self Care Barriers to Discharge: Continued Medical Work up  Expected Discharge Plan and Services Expected Discharge Plan: Home/Self Care   Discharge Planning Services: CM Consult   Living  arrangements for the past 2 months: Single Family Home                                       Social Determinants of Health (SDOH) Interventions    Readmission Risk Interventions No flowsheet data found.

## 2021-03-29 NOTE — Progress Notes (Signed)
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  Some of this related to inability to afford and take his medications including his metformin and his pancreatic enzymes.  He was brought to the ED 9/19 due to acute on chronic profound weakness and deconditioning superimposed on the above.    His initial evaluation was consistent with shock, metabolic panel consistent with DKA, acute renal insufficiency.  He was treated for DKA and for presumed hypovolemic shock, but remained on norepinephrine infusion even after volume resuscitation and improvement in his metabolic status.  PCCM consulted on 9/21.  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 Failure to Thrive Neuropathy Chronic back pain  Significant Hospital Events:  9/19: [facility] ED 9/20: Admit to inpatient SD 9/21: Begin Levophed infusion, PCCM consulted 9/23 tentative dx DI   9/25 rec wean levophed for mean of 50 systemic as long as no symptoms as ambulating on levophed drip, midodrine started 9/26 Levophed weaning slowly.  Weaning stress dose steroids   Interim History / Subjective:  Patient reports not sleeping well.  States feet are still swollen, which started yesterday and now having some dysuria and stream issues.    Up on NE at 7 mcg/min UOP 5.8L/ 24 w/ 2 unmeasured urinary occurrences  Objective   Blood pressure (!) 89/47, pulse (!) 57, temperature (!) 97.5 F (36.4 C), temperature source Axillary, resp.  rate 16, height 6\' 4"  (1.93 m), weight 98.5 kg, SpO2 97 %.        Intake/Output Summary (Last 24 hours) at 03/29/2021 1039 Last data filed at 03/29/2021 0800 Gross per 24 hour  Intake 1551.14 ml  Output 5700 ml  Net -4148.86 ml   Filed Weights   03/25/21 0435 03/26/21 0500 03/28/21 0500  Weight: 78.1 kg 83.4 kg 98.5 kg      Examination: BP (!) 89/47   Pulse (!) 57   Temp (!) 97.5 F (36.4 C) (Axillary)   Resp 16   Ht 6\' 4"  (1.93 m)   Wt 98.5 kg   SpO2 97%   BMI 26.43 kg/m  General:  Adult male lying in bed in NAD HEENT: MM pink/moist Neuro: Aox 3, MAE CV: rr, NSR to SB PULM:  non labored, CTA, room air GI: soft, bs active, NT, voids Extremities: warm/dry, +1 pedal edema/ trace tibial edema  Skin: no rashes   Resolved Hospital Problem list   DKA  Assessment & Plan:   Hypotension: Blood pressures run low.  Query if pooling splanchnic circulation in the setting of chronic pancreatitis, ascites was seen on CT as recorded possible underlying cirrhosis given his history of alcohol abuse. -  SBP goal 85, midodrine 10 mg 3 times.   -  have not been able to wean NE.  Have been weaning stress dose steroids.  Will stop and return back to solu-cortef 100 mg q8, and wean after NE off.  Pending free urine cortisol  - check co-ox, normal echo, but given hx, rule out high output HF/ beriberi ?  Acute kidney  injury (resolved): Creatinine elevated on arrival.  Likely hypovolemic in the setting of DKA.  Had robust urine output on the following days and is ongoing.  Some concern for DI.  Got DDAVP times once on 9/23 with increase in Uosm 122-> 545, .  Continues to have UOP usually > 5L / day and variable Na but remains <144.  - given unclear overall picture, will consult nephrology to help figure this out.  Appreciate input.  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  Lipase WNL -- Continue to supplement thiamine, folate, and multivitamin  -- check RUQ ultrasound eval cirrhosis  -- continue creon   Neuropathy --Gabapentin.  Reduce dose today given developing peripheral edema  Severe protein calorie malnutrition with unintentional weight loss  -- BMI 21.9 -- Supplement protein, maximize nutrition   Best Practice  Diet/type: Regular consistency (see orders) DVT prophylaxis: LMWH GI prophylaxis: N/A Lines: N/A Foley:  N/A Code Status:  full code Last date of multidisciplinary goals of care discussion [03/21/2021]     CCT: 45 mins   Posey Boyer, ACNP [facility] 03/29/2021, 10:39 AM  See Amion for pager If no response to pager, please call PCCM consult pager After 7:00 pm call Elink

## 2021-03-29 NOTE — Progress Notes (Signed)
eLink Physician-Brief Progress Note Patient Name: Allen Russell DOB: 03-09-1961 MRN: [id number]   Date of Service  03/29/2021  HPI/Events of Note  Received request for melatonin.  Pt has responded well to 10mg  of melatonin in the past.  eICU Interventions  Melatonin 10mg  ordered qHS prn.     Intervention Category Minor Interventions: Other:  03/29/2021, 11:56 PM

## 2021-03-30 DIAGNOSIS — K8681 Exocrine pancreatic insufficiency: Secondary | ICD-10-CM

## 2021-03-30 DIAGNOSIS — R739 Hyperglycemia, unspecified: Secondary | ICD-10-CM

## 2021-03-30 DIAGNOSIS — D649 Anemia, unspecified: Secondary | ICD-10-CM

## 2021-03-30 LAB — GLUCOSE, CAPILLARY
Glucose-Capillary: 147 mg/dL — ABNORMAL HIGH (ref 70–99)
Glucose-Capillary: 78 mg/dL (ref 70–99)
Glucose-Capillary: 129 mg/dL — ABNORMAL HIGH (ref 70–99)
Glucose-Capillary: 63 mg/dL — ABNORMAL LOW (ref 70–99)
Glucose-Capillary: 91 mg/dL (ref 70–99)
Glucose-Capillary: 102 mg/dL — ABNORMAL HIGH (ref 70–99)

## 2021-03-30 LAB — BASIC METABOLIC PANEL
Anion gap: 8 (ref 5–15)
BUN: 20 mg/dL (ref 6–20)
CO2: 29 mmol/L (ref 22–32)
Calcium: 8.2 mg/dL — ABNORMAL LOW (ref 8.9–10.3)
Chloride: 100 mmol/L (ref 98–111)
Creatinine, Ser: 0.94 mg/dL (ref 0.61–1.24)
GFR, Estimated: >60 mL/min (ref >60)
Glucose, Bld: 260 mg/dL — ABNORMAL HIGH (ref 70–99)
Potassium: 4 mmol/L (ref 3.5–5.1)
Sodium: 137 mmol/L (ref 135–145)

## 2021-03-30 LAB — OSMOLALITY, URINE: Osmolality, Ur: 298 mOsm/kg — ABNORMAL LOW (ref 300–900)

## 2021-03-30 MED ORDER — MIDODRINE HCL 5 MG PO TABS
15.0000 mg | ORAL_TABLET | Freq: Three times a day (TID) | ORAL | Status: DC
  Administered 2021-03-30 – 2021-04-04 (×16): 15 mg via ORAL
  Filled 2021-03-30 (×16): qty 3

## 2021-03-30 MED ORDER — DEXTROSE 10 % IV SOLN: INTRAVENOUS | Status: DC

## 2021-03-30 MED ORDER — INSULIN GLARGINE-YFGN 100 UNIT/ML ~~LOC~~ SOLN
12.0000 [IU] | Freq: Two times a day (BID) | SUBCUTANEOUS | Status: DC
  Administered 2021-03-30 – 2021-04-04 (×11): 12 [IU] via SUBCUTANEOUS
  Filled 2021-03-30 (×12): qty 0.12

## 2021-03-30 MED ORDER — FLUDROCORTISONE ACETATE 0.1 MG PO TABS
0.1000 mg | ORAL_TABLET | Freq: Every day | ORAL | Status: DC
  Administered 2021-03-30 – 2021-04-01 (×3): 0.1 mg via ORAL
  Filled 2021-03-30 (×5): qty 1

## 2021-03-30 NOTE — Progress Notes (Signed)
Patient ID: Allen Russell, male   DOB: 1961/04/07, 60 y.o.   MRN: [id number] [facility] Progress Note   Assessment/ Plan:   1.  Polyuria: Urine osmolality yesterday was 298 with an overnight urine volume of 4.45 L which approximately total 1300 mOsm secretion per day-this reflects solute diuresis rather than nephrogenic DI or polydipsia driving his polyuria.  He remains with normal sodium level and I suspect he will reach equilibrium state with regards to urine volume output over the next several days as his renal medulla concentration gradient is reestablished.  Continue to follow daily labs and urine output and reconsult if needed (I am covering through 10/2). 2.  Hypotension: With possible element of adrenal insufficiency for which he is on stress dose steroids along with fludrocortisone to mimic mineralocorticoid effect.  On midodrine to help improve vasoconstriction and additional suspicion raised for splanchnic pooling. 3.  Diabetic ketoacidosis: This appears to have resolved with correction of his multiple electrolyte abnormalities and acute kidney injury. 4.  Hypokalemia: Mild and secondary to polyuria, agree with the ongoing oral replacement.  Subjective:   Reports to be feeling better, denies any chest pain or shortness of breath.  Remains on Levophed   Objective:   BP (!) 89/58   Pulse 73   Temp 98 F (36.7 C) (Oral)   Resp 14   Ht 6\' 4"  (1.93 m)   Wt 98.5 kg   SpO2 100%   BMI 26.43 kg/m   Intake/Output Summary (Last 24 hours) at 03/30/2021 1350 Last data filed at 03/30/2021 1200 Gross per 24 hour  Intake 1470.28 ml  Output 4650 ml  Net -3179.72 ml   Weight change:   Physical Exam: Gen: Comfortably resting flat in bed, watching television CVS: Pulse regular rhythm, normal rate, S1 and S2 normal Resp: Clear to auscultation bilaterally, no rales/rhonchi Abd: Soft, mildly distended, bowel sounds normal Ext: Trace lower extremity edema  Imaging: 04/01/2021 Abdomen  Limited RUQ (LIVER/GB)  Result Date: 03/29/2021 CLINICAL DATA:  Hepatic cysts on CT, unintentional weight loss, abdominal pain EXAM: ULTRASOUND ABDOMEN LIMITED RIGHT UPPER QUADRANT COMPARISON:  03/24/2021 FINDINGS: Gallbladder: Surgically absent. Common bile duct: Diameter: 8 mm Liver: Liver demonstrates normal echotexture. No focal abnormalities. Specifically, the punctate hypodensities seen on recent CT are too small to characterize by ultrasound, though are likely benign cysts based on long-term stability. No intrahepatic duct dilation. Portal vein is patent on color Doppler imaging with normal direction of blood flow towards the liver. Other: None. IMPRESSION: 1. Unremarkable right upper quadrant ultrasound in this patient status post cholecystectomy. Electronically Signed   By: 03/26/2021 M.D.   On: 03/29/2021 22:01    Labs: BMET Recent Labs  Lab 03/24/21 1241 03/25/21 0250 03/25/21 1147 03/25/21 2000 03/26/21 0500 03/27/21 0547 03/28/21 0538  NA 135 144 134* 127* 129* 142 141  K 3.2* 3.1* 3.9 3.8 3.5 3.3* 3.4*  CL 101 111 99 92* 93* 103 105  CO2 24 28 27 29 28 30 31   GLUCOSE 261* 131* 249* 304* 275* 271* 169*  BUN 10 11 11 13 13 15 18   CREATININE 0.60* 0.52* 0.67 0.70 0.68 0.87 0.78  CALCIUM 8.8* 8.6* 8.4* 8.3* 8.4* 8.8* 8.6*   CBC Recent Labs  Lab 03/25/21 0250 03/28/21 0538  WBC 5.1 7.1  HGB 11.0* 9.9*  HCT 31.0* 29.6*  MCV 88.3 95.2  PLT 184 165    Medications:     (feeding supplement) PROSource Plus  30 mL Oral BID BM  ALPRAZolam  0.25 mg Oral Once   Chlorhexidine Gluconate Cloth  6 each Topical Daily   enoxaparin (LOVENOX) injection  40 mg Subcutaneous Q24H   feeding supplement (GLUCERNA SHAKE)  237 mL Oral BID BM   fludrocortisone  0.1 mg Oral Daily   folic acid  1 mg Oral Daily   gabapentin  300 mg Oral BID   insulin aspart  0-15 Units Subcutaneous TID WC   insulin aspart  0-5 Units Subcutaneous QHS   insulin aspart  6 Units Subcutaneous TID WC    insulin glargine-yfgn  25 Units Subcutaneous BID   lipase/protease/amylase  36,000 Units Oral TID WC   midodrine  15 mg Oral TID WC   multivitamin with minerals  1 tablet Oral Daily   potassium chloride  40 mEq Oral TID   sodium chloride flush  10-40 mL Intracatheter Q12H   thiamine  100 mg Oral Daily   Zetta Bills, MD 03/30/2021, 1:50 PM

## 2021-03-30 NOTE — Progress Notes (Signed)
Nutrition Follow-up  DOCUMENTATION CODES:   Severe malnutrition in context of chronic illness  INTERVENTION:  - continue 30 ml Prosource Plus BID and Glucerna Shake BID.   NUTRITION DIAGNOSIS:   Severe Malnutrition related to social / environmental circumstances as evidenced by severe fat depletion, severe muscle depletion. -ongoing  GOAL:   Patient will meet greater than or equal to 90% of their needs -minimally met on average   MONITOR:   PO intake, Supplement acceptance, Labs, Weight trends   ASSESSMENT:   60 y.o. 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 EGD 03/29/18 with removal of Axios stent), and L2-L3 posterior spinal fusion surgery. He presented to the ED due to weight loss, weakness, and DOE. He was found to be in DKA and started on insulin drip and IV fluids.  Most recently documented meal intakes: 9/22- 100% of breakfast, 50% of lunch, 100% of dinner (total of 1262 kcal and 71 grams of protein) 9/23- 100% of all meals (1716 kcal and 91 grams protein 9/26- 100% of lunch and 100% of dinner (total of 1579 kcal and 79 grams protein)  He has been accepting oral nutrition supplements 100% of the time offered which provide an additional 640 kcal and 50 grams protein/day.   Patient was discussed during rounds this AM.    Weight has been trending up since admission. Non-pitting edema to BUE and deep pitting edema to BLE documented in the edema section of flow sheet. He is noted to be -7.3 L since admission.   He was sitting up in bed at the time of visit, about to start eating lunch. He denied any nutrition-related questions or concerns at that time. Confirmed appetite is good/improved  from admission and that he has been able to tolerate meals and supplements without issue.    Labs reviewed; CBGs: 147 and 78 mg/dl, K: 3.4 mmol/l, Ca: 8.6 mg/dl.  Medications reviewed; 1 mg folvite/day, 100 mg solu-cortef x1 dose 9/27, sliding scale novolog, 6 units novolog TID, 25 units semglee BID, 43838 units creon TID with meals, 1 tablet multivitamin with minerals/day, 40 mEq Klor-Con TID, 100 mg oral thiamine/day.   Diet Order:   Diet Order             Diet Carb Modified Fluid consistency: Thin; Room service appropriate? Yes; Fluid restriction: 2000 mL Fluid  Diet effective now                   EDUCATION NEEDS:   Not appropriate for education at this time  Skin:  Skin Assessment: Reviewed RN Assessment  Last BM:  9/27  Height:   Ht Readings from Last 1 Encounters:  03/21/21 _0  (1.93 m)    Weight:   Wt Readings from Last 1 Encounters:  03/28/21 98.5 kg    Estimated Nutritional Needs:  Kcal:  2500-2750 kcal Protein:  125-140 grams Fluid:  >/= 2.8 L/day     Allen Matin, MS, RD, LDN, CNSC Inpatient Clinical Dietitian RD pager # available in AMION  After hours/weekend pager # available in [facility]

## 2021-03-30 NOTE — Progress Notes (Signed)
03/30/2021 Patient reported feeling shaky, found to have slightly low BP, levophed drip increased. Stat CBG revealed glucose of 68, per patient request, was given 4 oz of regular ginger ale. Cindy S. Clelia Croft BSN, RN, CCRP 03/30/2021 7:43 PM   Repeat CBG at 2000 hours was 91. Patient took 8pm supplement mixed in regular instead of diet ginger ale. Cindy S. Clelia Croft BSN, RN, [facility] 03/30/2021 9:24 PM

## 2021-03-30 NOTE — Progress Notes (Signed)
eLink Physician-Brief Progress Note Patient Name: Allen Russell DOB: August 23, 1960 MRN: [id number]   Date of Service  03/30/2021  HPI/Events of Note  Hypoglycemia - Blood glucose = 63.   eICU Interventions  Plan: D10W IV infusion at 40 mL/hour. D/C additional Novolog 6 units with meals. Decrease SEMGLEE to 12 units [place] Q 12 hours.      Intervention Category Major Interventions: Other:  Lenell Antu 03/30/2021, 9:55 PM

## 2021-03-30 NOTE — Progress Notes (Signed)
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  Some of this related to inability to afford and take his medications including his metformin and his pancreatic enzymes.  He was brought to the ED 9/19 due to acute on chronic profound weakness and deconditioning superimposed on the above.    His initial evaluation was consistent with shock, metabolic panel consistent with DKA, acute renal insufficiency.  He was treated for DKA and for presumed hypovolemic shock, but remained on norepinephrine infusion even after volume resuscitation and improvement in his metabolic status.  PCCM consulted on 9/21.  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 Failure to Thrive Neuropathy Chronic back pain  Significant Hospital Events:  9/19: [facility] ED 9/20: Admit to inpatient SD 9/21: Begin Levophed infusion, PCCM consulted 9/23 tentative dx DI   9/25 rec wean levophed for mean of 50 systemic as long as no symptoms as ambulating on levophed drip, midodrine started 9/26 Levophed weaning slowly.  Weaning stress dose steroids   Interim History / Subjective:  Still having leg edema-- has  TED hose. Remains on norepi with liberal BP goals MAP >50. No dizziness today. Appetite and nausea have improved since admission.  Objective   Blood pressure (!) 89/58, pulse 73, temperature 98 F (36.7 C), temperature source Oral, resp. rate 14, height 6\' 4"  (1.93 m), weight 98.5 kg, SpO2 100 %.         Intake/Output Summary (Last 24 hours) at 03/30/2021 1248 Last data filed at 03/30/2021 1200 Gross per 24 hour  Intake 1470.28 ml  Output 5150 ml  Net -3679.72 ml    Filed Weights   03/25/21 0435 03/26/21 0500 03/28/21 0500  Weight: 78.1 kg 83.4 kg 98.5 kg      Examination: BP (!) 89/58   Pulse 73   Temp 98 F (36.7 C) (Oral)   Resp 14   Ht 6\' 4"  (1.93 m)   Wt 98.5 kg   SpO2 100%   BMI 26.43 kg/m  General:  healthy appearing man sitting in bed in NAD HEENT: [place]/AT, eyes anicteric Neuro: Awake and alert, answering questions appropriately, moving all extremities spontaneously. Normal speech. CV: S1S2, RRR PULM:  breathing comfortably on RA, CTAB. GI:  soft, NT, ND Extremities: LE edema bilaterally, TED hose, no clubbing.  Skin: no rashes or bruising  Echo 9/21: LVEF 50-55%, mildly reduced RV function and moderately enlarged RV.   Coox 67%  CT with contrast from 9/22 reviewed>no PE.  Resolved Hospital Problem list   DKA  Assessment & Plan:   Hypotension-- likely acute on chronic. Query if pooling splanchnic circulation in the setting of chronic pancreatitis, ascites was seen on CT as recorded possible underlying cirrhosis given his history of alcohol abuse. No cirrhosis though on 10/21. No obvious cardiac issues to explain this; would expect if he had severe enough PH to cause hypotension he would have hypoxia as well and an abnormal coox. No evidence of neurogenic causes. Endocrine causes are possible. Sepsis seems unlikely with no known source, but  could explain distributive shock. - adding fludrocortisone to hydrocortisone; con't stress dose - Increase midodrine to 15 mg TID. - Will consider adding empiric antibiotics if nothing else works, but low suspicion for infection. Admission blood cultures NG.  Acute kidney injury (resolved): Creatinine elevated on arrival.  Likely hypovolemic in the setting of DKA.  Had robust urine output on the following days and is ongoing.   Some concern for DI.  Got DDAVP once on 9/23 with increase in Uosm 122-> 545.  Continues to have UOP usually > 5L / day and variable Na but has remained <144, but not checked in past 2 days. -Appreciate Nephrology's input.  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  Lipase WNL. -- Con't supplemental vitamins -con't pancreatic enzyme supplements  Neuropathy --con't Gabapentin.  Reduced dose given peripheral edema. -con't tramadol  Severe protein calorie malnutrition with unintentional weight loss -- about 50# over 3 months. No clear precipitant. -- BMI 21.9 -- encourage oral intake --supplements  Acute anemia-- suspect he was hemoconcentrated when he was admitted due to poor PO intake and this is potentially more chronic. No signs of active bleeding. MCV surprisingly normal given nutritional issues-- suspect mixed deficiencies with elevated RDW. -monitor periodically -transfuse for Hb<7 or hemodynamically significant bleeding  Best Practice  Diet/type: Regular consistency (see orders) DVT prophylaxis: LMWH GI prophylaxis: N/A Lines: N/A Foley:  N/A Code Status:  full code Last date of multidisciplinary goals of care discussion [03/30/2021]     This patient is critically ill with multiple organ system failure which requires frequent high complexity decision making, assessment, support, evaluation, and titration of therapies. This was completed through the application of advanced monitoring technologies and  extensive interpretation of multiple databases. During this encounter critical care time was devoted to patient care services described in this note for 55 minutes.  Steffanie Dunn, DO 03/30/21 1:27 PM [facility]

## 2021-03-31 LAB — COOXEMETRY PANEL
Carboxyhemoglobin: 0.7 % (ref 0.5–1.5)
Methemoglobin: 1.8 % — ABNORMAL HIGH (ref 0.0–1.5)
O2 Saturation: 64.8 %
Total hemoglobin: 9.9 g/dL — ABNORMAL LOW (ref 12.0–16.0)

## 2021-03-31 LAB — ALDOSTERONE + RENIN ACTIVITY W/ RATIO
ALDO / PRA Ratio: UNDETERMINED
Aldosterone: <1 ng/dL (ref 0.0–30.0)

## 2021-03-31 LAB — GLUCOSE, CAPILLARY
Glucose-Capillary: 79 mg/dL (ref 70–99)
Glucose-Capillary: 283 mg/dL — ABNORMAL HIGH (ref 70–99)
Glucose-Capillary: 343 mg/dL — ABNORMAL HIGH (ref 70–99)
Glucose-Capillary: 234 mg/dL — ABNORMAL HIGH (ref 70–99)

## 2021-03-31 LAB — CBC
HCT: 33.3 % — ABNORMAL LOW (ref 39.0–52.0)
Hemoglobin: 10.6 g/dL — ABNORMAL LOW (ref 13.0–17.0)
MCH: 31.7 pg (ref 26.0–34.0)
MCHC: 31.8 g/dL (ref 30.0–36.0)
MCV: 99.7 fL (ref 80.0–100.0)
Platelets: 254 10*3/uL (ref 150–400)
RBC: 3.34 MIL/uL — ABNORMAL LOW (ref 4.22–5.81)
RDW: 17.9 % — ABNORMAL HIGH (ref 11.5–15.5)
WBC: 5.6 10*3/uL (ref 4.0–10.5)
nRBC: 0 % (ref 0.0–0.2)

## 2021-03-31 LAB — T4, FREE: Free T4: 0.94 ng/dL (ref 0.61–1.12)

## 2021-03-31 MED ORDER — HYDROCORTISONE SOD SUC (PF) 100 MG IJ SOLR
100.0000 mg | Freq: Two times a day (BID) | INTRAMUSCULAR | Status: DC
  Administered 2021-03-31 – 2021-04-02 (×5): 100 mg via INTRAVENOUS
  Filled 2021-03-31 (×5): qty 2

## 2021-03-31 MED ORDER — PHENYLEPHRINE HCL-NACL 20-0.9 MG/250ML-% IV SOLN
0.0000 ug/min | INTRAVENOUS | Status: DC
  Administered 2021-03-31: 50 ug/min via INTRAVENOUS
  Administered 2021-03-31: 20 ug/min via INTRAVENOUS
  Administered 2021-04-01: 50 ug/min via INTRAVENOUS
  Administered 2021-04-01: 40 ug/min via INTRAVENOUS
  Filled 2021-03-31 (×4): qty 250

## 2021-03-31 MED ORDER — SODIUM CHLORIDE 0.9 % IV SOLN
2.0000 g | Freq: Three times a day (TID) | INTRAVENOUS | Status: DC
  Administered 2021-03-31 – 2021-04-02 (×5): 2 g via INTRAVENOUS
  Filled 2021-03-31 (×6): qty 2

## 2021-03-31 MED ORDER — VANCOMYCIN HCL 1750 MG/350ML IV SOLN
1750.0000 mg | Freq: Once | INTRAVENOUS | Status: AC
  Administered 2021-03-31: 1750 mg via INTRAVENOUS
  Filled 2021-03-31: qty 350

## 2021-03-31 MED ORDER — VANCOMYCIN HCL 1500 MG/300ML IV SOLN
1500.0000 mg | Freq: Two times a day (BID) | INTRAVENOUS | Status: DC
  Administered 2021-04-01 – 2021-04-02 (×3): 1500 mg via INTRAVENOUS
  Filled 2021-03-31 (×3): qty 300

## 2021-03-31 NOTE — Progress Notes (Signed)
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  Some of this related to inability to afford and take his medications including his metformin and his pancreatic enzymes.  He was brought to the ED 9/19 due to acute on chronic profound weakness and deconditioning superimposed on the above.    His initial evaluation was consistent with shock, metabolic panel consistent with DKA, acute renal insufficiency.  He was treated for DKA and for presumed hypovolemic shock, but remained on norepinephrine infusion even after volume resuscitation and improvement in his metabolic status.  PCCM consulted on 9/21.  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 Failure to Thrive Neuropathy Chronic back pain  Significant Hospital Events:  9/19: [facility] ED 9/20: Admit to inpatient SD 9/21: Begin Levophed infusion, PCCM consulted 9/23 tentative dx DI   9/25 rec wean levophed for mean of 50 systemic as long as no symptoms as ambulating on levophed drip, midodrine started 9/26 Levophed weaning slowly.  Weaning stress dose steroids 9/29 trial of changing levo to neo with intention of rechecking coox on neo. Checking Free T3 T4. Adding steroids     Interim History / Subjective:  NE increased overnight No acute events otherwise   Objective   Blood pressure (!) 106/59, pulse (!) 54, temperature (!) 97.4 F (36.3 C), temperature source Axillary, resp. rate 13, height 6\' 4"  (1.93 m), weight 88.3  kg, SpO2 94 %.        Intake/Output Summary (Last 24 hours) at 03/31/2021 1100 Last data filed at 03/31/2021 0918 Gross per 24 hour  Intake 2725.23 ml  Output 6475 ml  Net -3749.77 ml   Filed Weights   03/26/21 0500 03/28/21 0500 03/31/21 0433  Weight: 83.4 kg 98.5 kg 88.3 kg      Examination: BP (!) 106/59   Pulse (!) 54   Temp (!) 97.4 F (36.3 C) (Axillary) Comment: blankets applied  Resp 13   Ht 6\' 4"  (1.93 m)   Wt 88.3 kg   SpO2 94%   BMI 23.70 kg/m  General:  WDWN healthy appearing middle aged M, reclined in bed NAD  Psych: euthymic mood concruent affect  HEENT: NCAT anicteric sclera pink mm Neuro: AAOx4 following commands  no focal deficits  CV: bradycardic, regular. S1s2. Cap refill <3sec  PULM:  CTAb on RA. Symmetrical chest expansion. No accessory use  GI:  soft ndnt  Extremities: BLE ted hose. No acute joint deformity LUE PICC  Skin: c/d/w no rash   Resolved Hospital Problem list   DKA AKI  Assessment & Plan:   Hypotension-- likely acute on chronic. -Query if pooling splanchnic circulation in the setting of chronic pancreatitis, ascites was seen on CT as recorded possible underlying cirrhosis given his history of alcohol abuse. RUQ 04/02/21 without cirrhosis  -No evidence of neurogenic causes.  - sepsis seems unlikely with no other features c/w infection at this time  -No obvious cardiac etiology -- ECHO had slightly decr RV fxn -- but if RV dysfunction were causing shock would think we  might see other signs (ie if PH, or PE -- hypoxia) Coox 67%, but this is on NE. It is interesting that his he has been Bradycardic this admit.  -endocrine source possible -autonomic dysfunction  / autonomic neuropathy seems likely (has been brady in the ICU, looks like was dizzy tachy hypotensive on presentation but could have been confounded by hypovolemia) but DM can be etiology of this and might be worth considering if no revealing answers for shock  P -adding  hydrocortisone -cont florinef  -TID midodrine  -check Free T3 t4 -- TSH was normal earlier this admit but at with persistent bradycardia and shock, worth checking -also consider that while coox normal, this was on NE. Change to neo and recheck coox  -check CBC and PCT  -if nothing actionable is resulting could also consider addition of anticholinergics, and see if incr HR helps incr BP (at office visits looks like he is not bradycardic, not sure when this started)  -check orthostatic vitals after changed to neo   Polyuria -labs more c/w solute diuresis instead of nephrogenic DI -did get DDAVP once on 9/23 for possible DI and did have incr in Osm  P -Appreciate nephro -- felt that pt will likely re-equilibrate  - aldosterone and renin levels pending -trend metabolic panel, I/Os   Hx EtOH abuse Hx pancreatitis x2, c/b pancreatic necrosis s/p surgical necrosectomy and cystogastrostomy (2012) Hx recurrent pancreatic pseudocyst  Hepatitis panel negative.  Lipase WNL. P -cont pancreatic enzyme sups -multivit   DM2 Hyperglycemia 2/2 pancreatitis  P -SSI PRN -glargine 25 units BID -aspart 6 units TIDAC    -holding PTA metformin  Neuropathy  -gaba, tramadol   Severe protein/calorie malnutrition  -poor PTA appetite for a few months with associated 50lb wt loss  P -RDN    Acute anemia -- suspect he was hemoconcentrated when he was admitted due to poor PO intake and this is potentially more chronic. No signs of active bleeding. MCV surprisingly normal given nutritional issues-- suspect mixed deficiencies with elevated RDW. -monitor periodically -transfuse for Hb<7 or hemodynamically significant bleeding  Best Practice  Diet/type: Regular consistency (see orders) DVT prophylaxis: LMWH GI prophylaxis: N/A Lines: N/A Foley:  N/A Code Status:  full code Last date of multidisciplinary goals of care discussion [03/30/2021]   CRITICAL CARE Performed by: Lanier Clam   Total  critical care time: 67 minutes  Critical care time was exclusive of separately billable procedures and treating other patients.  Critical care was necessary to treat or prevent imminent or life-threatening deterioration.  Critical care was time spent personally by me on the following activities: development of treatment plan with patient and/or surrogate as well as nursing, discussions with consultants, evaluation of patient's response to treatment, examination of patient, obtaining history from patient or surrogate, ordering and performing treatments and interventions, ordering and review of laboratory studies, ordering and review of radiographic studies, pulse oximetry and re-evaluation of patient's condition.  Tessie Fass MSN, AGACNP-BC [place] [facility] Amion for pager  03/31/2021, 11:00 AM

## 2021-03-31 NOTE — Progress Notes (Signed)
Inpatient Diabetes Program Recommendations  AACE/ADA: New Consensus Statement on Inpatient Glycemic Control (2015)  Target Ranges:  Prepandial:   less than 140 mg/dL      Peak postprandial:   less than 180 mg/dL (1-2 hours)      Critically ill patients:  140 - 180 mg/dL   Lab Results  Component Value Date   GLUCAP 79 03/31/2021   HGBA1C 11.4 (H) 03/21/2021    Review of Glycemic Control  Diabetes history: DM2 Current orders for Inpatient glycemic control: Semglee 12 units BID, Novolog 0-15 units TID with meals and 0-5 HS  Blood sugars trending well.  HgbA1C - 11.4%  Inpatient Diabetes Program Recommendations:    Agree with orders.  Long discussion with pt about his diabetes control and HgbA1C of 11.4%. We discussed going home on insulin and pt prefers to use insulin pen. States he had been eating pretty healthy at home and exercising daily until he got sick. Seems interested in lifestyle modification of healthy diet, exercise to assist with controlling blood sugars. Will teach insulin pen administration prior to discharge. Pt will f/u with his PCP for diabetes management.   Continue to follow.   Thank you. Ailene Ards, RD, LDN, CDE Inpatient Diabetes Coordinator [phone]

## 2021-03-31 NOTE — Progress Notes (Signed)
Preserved coox despite being off inotropic support. Starting empiric antibiotics for sepsis of unknown source, although still low suspicion, but no other obvious source of shock thus far.  Steffanie Dunn, DO 03/31/21 6:57 PM [facility]

## 2021-03-31 NOTE — Progress Notes (Signed)
Pharmacy Antibiotic Note  Allen Russell is a 60 y.o. male admitted on 03/21/2021 with r/o sepsis.  Pharmacy has been consulted for Vancomycin & Cefepime dosing.  Plan: Cefepime 2gm q8 Vancomycin 1750mg  x1, then 1500mg  q12  Height: 6\' 4"  (193 cm) Weight: 88.3 kg (194 lb 10.7 oz) IBW/kg (Calculated) : 86.8  Temp (24hrs), Avg:97.5 F (36.4 C), Min:97 F (36.1 C), Max:98.1 F (36.7 C)  Recent Labs  Lab 03/25/21 0250 03/25/21 1147 03/25/21 2000 03/26/21 0500 03/27/21 0547 03/28/21 0538 03/30/21 1435 03/31/21 0840  WBC 5.1  --   --   --   --  7.1  --  5.6  CREATININE 0.52*   < > 0.70 0.68 0.87 0.78 0.94  --    < > = values in this interval not displayed.    Estimated Creatinine Clearance: 106.4 mL/min (by C-G formula based on SCr of 0.94 mg/dL).    Allergies  Allergen Reactions   Morphine Other (See Comments)    headache      Antimicrobials this admission: 9/29 Cefepime >>  9/29 Vancomycin >>   Dose adjustments this admission:  Microbiology results: 9/22 BCx: ng-final  Thank you for allowing pharmacy to be a part of this patient's care.  10/29 PharmD 03/31/2021 7:27 PM

## 2021-03-31 NOTE — TOC Progression Note (Signed)
Transition of Care [facility]) - Progression Note    Patient Details  Name: Allen Russell MRN: [id number] Date of Birth: [birth year]  [facility] CM/SW Contact  Golda Acre, RN Phone Number: 03/31/2021, 8:40 AM  Clinical Narrative:    Significant Hospital Events:  9/19: [facility] ED 9/20: Admit to inpatient SD 9/21: Begin Levophed infusion, PCCM consulted 9/23 tentative dx DI   9/25 rec wean levophed for mean of 50 systemic as long as no symptoms as ambulating on levophed drip, midodrine started 9/26 Levophed weaning slowly.  Weaning stress dose steroids  TOC PLAN OF CARE: Have instructed the mother to go and start applying for medicaid and disability for the patient. Following for progression Remains on levophed. Plan is to return to home. Expected Discharge Plan: Home/Self Care Barriers to Discharge: Continued Medical Work up  Expected Discharge Plan and Services Expected Discharge Plan: Home/Self Care   Discharge Planning Services: CM Consult   Living arrangements for the past 2 months: Single Family Home                                       Social Determinants of Health (SDOH) Interventions    Readmission Risk Interventions No flowsheet data found.

## 2021-04-01 DIAGNOSIS — R3589 Other polyuria: Secondary | ICD-10-CM

## 2021-04-01 DIAGNOSIS — I959 Hypotension, unspecified: Secondary | ICD-10-CM

## 2021-04-01 DIAGNOSIS — E1143 Type 2 diabetes mellitus with diabetic autonomic (poly)neuropathy: Secondary | ICD-10-CM

## 2021-04-01 DIAGNOSIS — E1165 Type 2 diabetes mellitus with hyperglycemia: Secondary | ICD-10-CM

## 2021-04-01 LAB — GLUCOSE, CAPILLARY
Glucose-Capillary: 261 mg/dL — ABNORMAL HIGH (ref 70–99)
Glucose-Capillary: 264 mg/dL — ABNORMAL HIGH (ref 70–99)
Glucose-Capillary: 345 mg/dL — ABNORMAL HIGH (ref 70–99)
Glucose-Capillary: 282 mg/dL — ABNORMAL HIGH (ref 70–99)

## 2021-04-01 LAB — PROCALCITONIN: Procalcitonin: <0.1 ng/mL

## 2021-04-01 LAB — T3, FREE: T3, Free: 2.5 pg/mL (ref 2.0–4.4)

## 2021-04-01 MED ORDER — CYCLOBENZAPRINE HCL 10 MG PO TABS
10.0000 mg | ORAL_TABLET | Freq: Two times a day (BID) | ORAL | Status: DC | PRN
  Administered 2021-04-01 – 2021-04-06 (×6): 10 mg via ORAL
  Filled 2021-04-01 (×6): qty 1

## 2021-04-01 NOTE — Progress Notes (Signed)
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  Some of this related to inability to afford and take his medications including his metformin and his pancreatic enzymes.  He was brought to the ED 9/19 due to acute on chronic profound weakness and deconditioning superimposed on the above.    His initial evaluation was consistent with shock, metabolic panel consistent with DKA, acute renal insufficiency.  He was treated for DKA and for presumed hypovolemic shock, but remained on norepinephrine infusion even after volume resuscitation and improvement in his metabolic status.  PCCM consulted on 9/21.  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 Failure to Thrive Neuropathy Chronic back pain  Significant Hospital Events:  9/19: [facility] ED 9/20: Admit to inpatient SD 9/21: Begin Levophed infusion, PCCM consulted 9/23 tentative dx DI   9/25 rec wean levophed for mean of 50 systemic as long as no symptoms as ambulating on levophed drip, midodrine started 9/26 Levophed weaning slowly.  Weaning stress dose steroids 9/29 trial of changing levo to neo with intention of rechecking coox on neo. Checking Free T3 T4. Adding steroids  Started abx 9/29 night with no other source of shock    Interim History / Subjective:  On  low dose neo gtt, incr form 10 to 50 overnight while sleeping Started abx last night   Objective   Blood pressure (!) 106/59, pulse 60, temperature 97.6 F (36.4  C), temperature source Axillary, resp. rate 17, height 6\' 4"  (1.93 m), weight 88.3 kg, SpO2 99 %.        Intake/Output Summary (Last 24 hours) at 04/01/2021 0909 Last data filed at 04/01/2021 0900 Gross per 24 hour  Intake 4395.48 ml  Output 5825 ml  Net -1429.52 ml   Filed Weights   03/28/21 0500 03/31/21 0433 04/01/21 0413  Weight: 98.5 kg 88.3 kg 88.3 kg      Examination: BP (!) 106/59   Pulse 60   Temp 97.6 F (36.4 C) (Axillary)   Resp 17   Ht 6\' 4"  (1.93 m)   Wt 88.3 kg   SpO2 99%   BMI 23.70 kg/m  General:  WDWN healthy appearing middle aged M reclined in bed NAD  Psych: Euthymic mood, congruent affect. Appropriate for age and situation  HEENT: NCAT pink mmm anicteric sclera  Neuro: AAOx4 following commands no focal deficit  CV: Bradycardic, regular. S1s2 no rgm cap refill < 3 sec PULM:  CTAb symmetrical chest expansion, even unlabored on RA  GI:  soft ndnt + bowel sounds  Extremities:LUE PICC. No acute joint deformity. BLE edema, compression stockings  Skin: c/d/w no rash   Resolved Hospital Problem list   DKA AKI  Assessment & Plan:   Hypotension  Acute (shock) vs acute on chronic -? pooling splanchnic circulation in the setting of chronic pancreatitis. RUQ 04/03/21 without cirrhosis  -No evidence of neurogenic causes.  -No obvious cardiac etiology -- ECHO had slightly decr RV fxn, but workup not c/w PH/PE causing RHF causing shock. Not hypoxic. Coox unchanged from  NE to neo  -autonomic dysfunction  / autonomic neuropathy seems likely (has been brady in the ICU, looks like was dizzy tachy hypotensive on presentation but could have been confounded by hypovolemia) but DM can be etiology of this and might be worth considering if no revealing answers for shock  -not bleeding -workup not c/w sepsis (PCT < 0.1, WBC WNL)  -with orthostatic vitals, SBP drops by 10, HR unchanged  -endocrine sources seem possible but so far not revealing (TSH Free t3 free t4 WNL). Possible  component of adrenal insufficiency?  P -cont hydrocortisone and florinef  -TID midodrine  -started abx 9/29 though no source and labs really not c/w infection -- but without any clear etiology of this shock felt low risk to start  -could also consider addition of anticholinergics, and see if incr HR helps incr BP (at prior office visits looks like he is not bradycardic, however with benign cardiac workup thusfar not sure that slight bradycardia is really contributing much here) -do think that nocturnally we can likely allow a more liberal MAP goal  Polyuria  -labs more c/w solute diuresis instead of nephrogenic DI -did get DDAVP once on 9/23 for possible DI and did have incr in Osm  P -Appreciate nephro -- felt that pt will likely re-equilibrate  -fluid restriction per nephro - trend metabolic panel I/Os   Hx EtOH abuse Hx pancreatitis x2, c/b pancreatic necrosis s/p surgical necrosectomy and cystogastrostomy (2012) Hx recurrent pancreatic pseudocyst  Hepatitis panel negative.  Lipase WNL. P -cont pancreatic enzyme sups -multivit   DM2 Hyperglycemia 2/2 pancreatitis  P -dc dextrose gtt -SSI   Neuropathy  Acute back pain  -gaba, tramadol  -mobility -if needed can add flexeril which pt has tolerated well previously for back spasms   Severe protein/calorie malnutrition  -poor PTA appetite for a few months with associated 50lb wt loss  P -RDN   Anemia, suspect in setting of nutrient deficiencies  P -Hgb goal > 7   Best Practice  Diet/type: Regular consistency (see orders) DVT prophylaxis: LMWH GI prophylaxis: N/A Lines: N/A Foley:  N/A Code Status:  full code Last date of multidisciplinary goals of care discussion [03/31/2021]   CRITICAL CARE Performed by: Lanier Clam   Total critical care time: 40 minutes  Critical care time was exclusive of separately billable procedures and treating other patients. Critical care was necessary to treat or prevent imminent  or life-threatening deterioration.  Critical care was time spent personally by me on the following activities: development of treatment plan with patient and/or surrogate as well as nursing, discussions with consultants, evaluation of patient's response to treatment, examination of patient, obtaining history from patient or surrogate, ordering and performing treatments and interventions, ordering and review of laboratory studies, ordering and review of radiographic studies, pulse oximetry and re-evaluation of patient's condition.  Tessie Fass MSN, AGACNP-BC [facility] Pulmonary/Critical Care Medicine Amion for pager 04/01/2021, 9:09 AM

## 2021-04-01 NOTE — Progress Notes (Signed)
eLink Physician-Brief Progress Note Patient Name: Allen Russell DOB: 11-Nov-1960 MRN: [id number]   Date of Service  04/01/2021  HPI/Events of Note  Chronic low back spasm. Takes Flexeril at home.   eICU Interventions  Plan: Restart home Flexeril 10  mg PO Q 12 hours PRN.     Intervention Category Major Interventions: Other:  Lenell Antu 04/01/2021, 10:53 PM

## 2021-04-01 NOTE — Consult Note (Signed)
Cardiology Consultation:   Patient ID: [name] MRN: [id number]; DOB: 02-14-1961  Admit date: 03/21/2021 Date of Consult: 04/01/2021  PCP:  Steve Rattler, PA-C   [facility] Providers Cardiologist:  New to [facility]   Patient Profile:   Allen Russell is a 60 y.o. 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 Sherene Sires.  History of Present Illness:   Allen Russell presented to the hospital 03/21/2021 with complaints of weight loss, weakness, and dyspnea on exertion.  He reported approximately 50 pound weight loss in the past 2 to 3 months which was contributed to by financial difficulties and inability to afford food/medication.  He had increased polydipsia and urinary frequency.  Vitals were initially stable though labs were concerning for possible DKA with elevated blood glucose and anion gap.  He was started on an insulin drip.  He had a mild AKI and was given IV fluids.  He was noted to have orthostasis which was attributed to dehydration and begin IV fluids recommended.  Echocardiogram 03/24/2021 showed EF 50-55%, normal LV diastolic function, no RWMA, mildly reduced RV systolic function, moderate RV dilation, mildly dilated aortic root (42 mm) and no significant valvular abnormalities.  He continued to have issues with hypotension requiring pressors.  In addition to this he was started on hydrocortisone and Florinef, as well as midodrine 3 times daily.  Autonomic dysfunction/autonomic neuropathy are on the differential list.  He was started on antibiotics 03/31/21 despite no clear source of infection given persistent hypotension.  Cardiology asked to evaluate.  He has no prior cardiac  history and does not follow with cardiology outpatient.  Prior to this admission his last echocardiogram appears to be in 2019 with EF noted to be 50% and RV size/function were normal at that time.  Does not appear to have had prior ischemic evaluation, though was noted to have primary artery calcifications on CT this admission, notably in the LAD territory. EKG this admission shows sinus bradycardia, rate 45 bpm isolated T wave inversion in III, otherwise normal STE/D.  At the time of this evaluation he is resting comfortably in bed. He reports a longstanding history of orthostasis dating back to 2005. He reported history of syncope at that time and underwent a table tilt test which he reports resulted in immediate syncope. He reports starting to feel poorly over the past 2-3 months, worse over the past few weeks. Generalized weakness, poor appetite, and apathy. Prior to this he was going to the gym 1-2 hours a day. He doesn't not recall any clear trigger that caused him to decline and significantly cut back on activity and diet. He has not had any chest pain or SOB. He reports no recent syncope as he has learned to stand slowly or quickly sit back down if he feels dizziness. He has not been on any antihypertensive medications and reports BP is often in the 110s/70s when he donates platelets (which he did regularly).   Past Medical History:  Diagnosis Date   Diabetes mellitus without complication (HCC)     Past Surgical History:  Procedure Laterality Date   APPENDECTOMY     BACK SURGERY     CHOLECYSTECTOMY     HERNIA REPAIR  Home Medications:  Prior to Admission medications   Medication Sig Start Date End Date Taking? Authorizing Provider  diphenhydrAMINE (BENADRYL) 25 MG tablet Take 25 mg by mouth every 6 (six) hours as needed for allergies.   Yes [provider]  gabapentin (NEURONTIN) 600 MG tablet TAKE 1 TABLET BY MOUTH 4 TIMES DAILY Patient taking differently: Take 300 mg by  mouth daily. 06/24/18  Yes Kirsteins, Victorino Sparrow, MD  metFORMIN (GLUCOPHAGE-XR) 500 MG 24 hr tablet Take 250 mg by mouth daily with breakfast.   Yes [provider]  Multiple Vitamin (MULTIVITAMIN) tablet Take 1 tablet by mouth daily.   Yes [provider]  ranitidine (ZANTAC) 150 MG tablet Take 150 mg by mouth daily as needed for heartburn.   Yes [provider]  traMADol (ULTRAM) 50 MG tablet TAKE 1 TABLET BY MOUTH EVERY 6 HOURS AS NEEDED Patient taking differently: Take 50 mg by mouth daily as needed for moderate pain. 02/21/18  Yes Kirsteins, Victorino Sparrow, MD  cyclobenzaprine (FLEXERIL) 10 MG tablet Take 1 tablet (10 mg total) by mouth 2 (two) times daily between meals as needed. Patient not taking: No sig reported 12/14/17   Erick Colace, MD    Inpatient Medications: Scheduled Meds:  (feeding supplement) PROSource Plus  30 mL Oral BID BM   ALPRAZolam  0.25 mg Oral Once   Chlorhexidine Gluconate Cloth  6 each Topical Daily   enoxaparin (LOVENOX) injection  40 mg Subcutaneous Q24H   feeding supplement (GLUCERNA SHAKE)  237 mL Oral BID BM   fludrocortisone  0.1 mg Oral Daily   folic acid  1 mg Oral Daily   gabapentin  300 mg Oral BID   hydrocortisone sod succinate (SOLU-CORTEF) inj  100 mg Intravenous Q12H   insulin aspart  0-15 Units Subcutaneous TID WC   insulin aspart  0-5 Units Subcutaneous QHS   insulin glargine-yfgn  12 Units Subcutaneous BID   lipase/protease/amylase  36,000 Units Oral TID WC   midodrine  15 mg Oral TID WC   multivitamin with minerals  1 tablet Oral Daily   potassium chloride  40 mEq Oral TID   sodium chloride flush  10-40 mL Intracatheter Q12H   thiamine  100 mg Oral Daily   Continuous Infusions:  sodium chloride Stopped (04/01/21 1207)   ceFEPime (MAXIPIME) IV Stopped (04/01/21 1237)   phenylephrine (NEO-SYNEPHRINE) Adult infusion 50 mcg/min (04/01/21 1207)   vancomycin Stopped (04/01/21 1018)   PRN Meds: acetaminophen,  dextrose, docusate sodium, guaiFENesin-dextromethorphan, lipase/protease/amylase, melatonin, ondansetron (ZOFRAN) IV, ondansetron, polyethylene glycol, sodium chloride flush, traMADol  Allergies:    Allergies  Allergen Reactions   Morphine Other (See Comments)    headache      Social History:   Social History   Socioeconomic History   Marital status: Single    Spouse name: Not on file   Number of children: Not on file   Years of education: Not on file   Highest education level: Not on file  Occupational History   Not on file  Tobacco Use   Smoking status: Never   Smokeless tobacco: Never  Substance and Sexual Activity   Alcohol use: Not on file   Drug use: Not on file   Sexual activity: Not on file  Other Topics Concern   Not on file  Social History Narrative   Not on file   Social Determinants of Health   Financial Resource Strain: Not on file  Food Insecurity: Not on file  Transportation Needs:  Not on file  Physical Activity: Not on file  Stress: Not on file  Social Connections: Not on file  Intimate Partner Violence: Not on file    Family History:    Family History  Problem Relation Age of Onset   Breast cancer Mother    Thyroid disease Mother    Breast cancer Sister    Pancreatic cancer Maternal Grandmother    Ovarian cancer Maternal Grandmother    Hypertension Paternal Grandfather      ROS:  Please see the history of present illness.   All other ROS reviewed and negative.     Physical Exam/Data:   Vitals:   04/01/21 1345 04/01/21 1400 04/01/21 1415 04/01/21 1430  BP: (!) 96/35 (!) 100/42 (!) 85/55 (!) 124/52  Pulse: 61 (!) 55  70  Resp: 16 14 (!) 31 15  Temp:      TempSrc:      SpO2: 96% 93%  95%  Weight:      Height:        Intake/Output Summary (Last 24 hours) at 04/01/2021 1506 Last data filed at 04/01/2021 1247 Gross per 24 hour  Intake 4565.21 ml  Output 4750 ml  Net -184.79 ml   Last 3 Weights 04/01/2021 03/31/2021 03/28/2021   Weight (lbs) 194 lb 10.7 oz 194 lb 10.7 oz 217 lb 2.5 oz  Weight (kg) 88.3 kg 88.3 kg 98.5 kg     Body mass index is 23.7 kg/m.  General:  Well nourished, well developed, in no acute distress HEENT: sclera anicteric Neck: no JVD Vascular: No carotid bruits; Distal pulses 2+ bilaterally Cardiac:  normal S1, S2; RRR; no murmurs, rubs, or gallops Lungs:  clear to auscultation bilaterally, no wheezing, rhonchi or rales  Abd: soft, nontender, no hepatomegaly  Ext: no edema Musculoskeletal:  No deformities, BUE and BLE strength normal and equal Skin: warm and dry  Neuro:  CNs 2-12 intact, no focal abnormalities noted Psych:  Normal affect   EKG:  The EKG was personally reviewed and demonstrates:  sinus bradycardia, rate 45 bpm isolated T wave inversion in III, otherwise normal STE/D. Telemetry:  Telemetry was personally reviewed and demonstrates:  sinus bradycardia at baseline with appropriate increase in HR with activity.   Relevant CV Studies: Echocardiogram 03/23/21: 1. Left ventricular ejection fraction, by estimation, is 50 to 55%. The  left ventricle has low normal function. The left ventricle has no regional  wall motion abnormalities. Left ventricular diastolic parameters were  normal.   2. Right ventricular systolic function is mildly reduced. The right  ventricular size is moderately enlarged.   3. The mitral valve is normal in structure. Trivial mitral valve  regurgitation. No evidence of mitral stenosis.   4. The aortic valve is grossly normal. There is mild calcification of the  aortic valve. Aortic valve regurgitation is not visualized. No aortic  stenosis is present.   5. Aortic dilatation noted. There is mild dilatation of the aortic root,  measuring 42 mm.   Laboratory Data:  High Sensitivity Troponin:   Recent Labs  Lab 03/25/21 0525 03/25/21 0750  TROPONINIHS 5 4     Chemistry Recent Labs  Lab 03/26/21 0500 03/27/21 0547 03/28/21 0538 03/30/21 1435   NA 129* 142 141 137  K 3.5 3.3* 3.4* 4.0  CL 93* 103 105 100  CO2 28 30 31 29   GLUCOSE 275* 271* 169* 260*  BUN 13 15 18 20   CREATININE 0.68 0.87 0.78 0.94  CALCIUM 8.4* 8.8* 8.6* 8.2*  MG 1.8  --   --   --   GFRNONAA >60 >60 >60 >60  ANIONGAP 8 9 5 8     No results for input(s): PROT, ALBUMIN, AST, ALT, ALKPHOS, BILITOT in the last 168 hours. Lipids No results for input(s): CHOL, TRIG, HDL, LABVLDL, LDLCALC, CHOLHDL in the last 168 hours.  Hematology Recent Labs  Lab 03/28/21 0538 03/31/21 0840  WBC 7.1 5.6  RBC 3.11* 3.34*  HGB 9.9* 10.6*  HCT 29.6* 33.3*  MCV 95.2 99.7  MCH 31.8 31.7  MCHC 33.4 31.8  RDW 17.1* 17.9*  PLT 165 254   Thyroid  Recent Labs  Lab 03/31/21 0840  FREET4 0.94    BNPNo results for input(s): BNP, PROBNP in the last 168 hours.  DDimer No results for input(s): DDIMER in the last 168 hours.   Radiology/Studies:  04/02/21 Abdomen Limited RUQ (LIVER/GB)  Result Date: 03/29/2021 CLINICAL DATA:  Hepatic cysts on CT, unintentional weight loss, abdominal pain EXAM: ULTRASOUND ABDOMEN LIMITED RIGHT UPPER QUADRANT COMPARISON:  03/24/2021 FINDINGS: Gallbladder: Surgically absent. Common bile duct: Diameter: 8 mm Liver: Liver demonstrates normal echotexture. No focal abnormalities. Specifically, the punctate hypodensities seen on recent CT are too small to characterize by ultrasound, though are likely benign cysts based on long-term stability. No intrahepatic duct dilation. Portal vein is patent on color Doppler imaging with normal direction of blood flow towards the liver. Other: None. IMPRESSION: 1. Unremarkable right upper quadrant ultrasound in this patient status post cholecystectomy. Electronically Signed   By: 03/26/2021 M.D.   On: 03/29/2021 22:01     Assessment and Plan:   1. Hypotension: unclear etiology. No clear cardiac souce - EKG non-ischemic; echo with EF 50-55%, no RWMA, though interestingly mildly reduced RV systolic funciton and moderate RV  dilation (previously normal in 2019). No clear infectious source, though started on trial of IV antibiotics yesterday. Cortisol stim test was negative. Aldosterone+renin test unable to be performed as wrong tubes sent. He has not had prior ischemic testing though does have coronary artery calcifications on CT - unlikely obstructive given reassuring LVEF. Suspect autonomic dysfunction as likely contributor. Unfortunately not much to offer in addition to current regimen. May benefit from autonomic dysfunction specialist evaluation at an outside hospital once stable enough to discharge - Continue hydrocortisone, florinef, and midodrine - Continue supportive care with compression stocking and abdominal binder when out of bed - Will defer infectious management to primary team  2. Abnormal echo: noted to have mildly reduced RVSF and moderate RAE on echo this admission. Unclear that this is contributing to his current presentation. EF is low normal an no RWMA. He does not have any anginal complaints. No underlying pulmonary disease to contributed to right sided dysfunction.  - No further inpatient cardiac work-up is recommended at this time  Remainder of care per primary team: - DM type 2 - ETOH abuse with pancreatitis/pancreatic insufficiency - Back pain - Malnutrition - Anemia   Risk Assessment/Risk Scores:   N/A  For questions or updates, please contact [facility] Please consult [email or web address] for contact info under    Signed, 2020, PA-C  04/01/2021 3:06 PM

## 2021-04-01 NOTE — Progress Notes (Signed)
Throughout my shift, patient's blood pressure seems to meet goal when awake and moving around. However, once he is asleep/resting, his blood pressure tends to have a significant decrease. CCM aware. Anda Kraft, RN 04/01/2021

## 2021-04-02 LAB — GLUCOSE, CAPILLARY
Glucose-Capillary: 375 mg/dL — ABNORMAL HIGH (ref 70–99)
Glucose-Capillary: 231 mg/dL — ABNORMAL HIGH (ref 70–99)
Glucose-Capillary: 320 mg/dL — ABNORMAL HIGH (ref 70–99)
Glucose-Capillary: 242 mg/dL — ABNORMAL HIGH (ref 70–99)

## 2021-04-02 LAB — PROCALCITONIN: Procalcitonin: <0.1 ng/mL

## 2021-04-02 MED ORDER — FLUDROCORTISONE ACETATE 0.1 MG PO TABS
0.2000 mg | ORAL_TABLET | Freq: Every day | ORAL | Status: DC
  Administered 2021-04-02 – 2021-04-04 (×3): 0.2 mg via ORAL
  Filled 2021-04-02 (×4): qty 2

## 2021-04-02 NOTE — Progress Notes (Signed)
NAME:  Allen Russell, MRN:  [id number], DOB:  12-02-1960, LOS: 12 ADMISSION DATE:  03/21/2021, CONSULTATION DATE:  03/23/21 REFERRING MD:  Hanley Ben, CHIEF COMPLAINT:  weight loss, weakness   Brief Narrative:  Allen Russell is a 60 yo male with PMH significant for DM, alcohol abuse, ETOH pancreatitis complicated by pancreatic necrosis, chronic back pain who presented  with weakness, decreased activity tolerance, unintended weight loss of over 50 pounds.  Some of this related to inability to afford and take his medications including his metformin and his pancreatic enzymes.  He was brought to the ED 9/19 due to acute on chronic profound weakness and deconditioning superimposed on the above.    His initial evaluation was consistent with shock, metabolic panel consistent with DKA, acute renal insufficiency.  He was treated for DKA and for presumed hypovolemic shock, but remained on norepinephrine infusion even after volume resuscitation and improvement in his metabolic status.  PCCM consulted on 9/21.  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 Failure to Thrive Neuropathy Chronic back pain  Significant Hospital Events:  9/19: [facility] ED 9/20: Admit to inpatient SD 9/21: Begin Levophed infusion, PCCM consulted 9/23 tentative dx DI   9/25 rec wean levophed for mean of 50 systemic as long as no symptoms as ambulating on levophed drip, midodrine started 9/26 Levophed weaning slowly.  Weaning stress dose steroids 9/29 trial of changing levo to neo with intention of rechecking coox on neo. Checking Free T3 T4. Adding steroids  Started abx 9/29 night with no other source of shock  Cards consult 9/30 c/w dysautonomia, pos tilt test in 2005   Scheduled Meds:  (feeding supplement) PROSource Plus  30 mL Oral BID BM   ALPRAZolam  0.25 mg Oral Once   Chlorhexidine Gluconate Cloth  6  each Topical Daily   enoxaparin (LOVENOX) injection  40 mg Subcutaneous Q24H   feeding supplement (GLUCERNA SHAKE)  237 mL Oral BID BM   fludrocortisone  0.1 mg Oral Daily   folic acid  1 mg Oral Daily   gabapentin  300 mg Oral BID   hydrocortisone sod succinate (SOLU-CORTEF) inj  100 mg Intravenous Q12H   insulin aspart  0-15 Units Subcutaneous TID WC   insulin aspart  0-5 Units Subcutaneous QHS   insulin glargine-yfgn  12 Units Subcutaneous BID   lipase/protease/amylase  36,000 Units Oral TID WC   midodrine  15 mg Oral TID WC   multivitamin with minerals  1 tablet Oral Daily   potassium chloride  40 mEq Oral TID   sodium chloride flush  10-40 mL Intracatheter Q12H   thiamine  100 mg Oral Daily   Continuous Infusions:  sodium chloride 10 mL/hr at 04/02/21 0700   ceFEPime (MAXIPIME) IV Stopped (04/02/21 0506)   phenylephrine (NEO-SYNEPHRINE) Adult infusion 30 mcg/min (04/02/21 0700)   vancomycin 1,500 mg (04/02/21 0824)   PRN Meds:.acetaminophen, cyclobenzaprine, dextrose, docusate sodium, guaiFENesin-dextromethorphan, lipase/protease/amylase, melatonin, ondansetron (ZOFRAN) IV, ondansetron, polyethylene glycol, sodium chloride flush, traMADol    Interim History / Subjective:  No complaints, good appetite, still on neo drip   Objective   Blood pressure (!) 98/49, pulse (!) 50, temperature 98.1 F (36.7 C), temperature source Oral, resp. rate 13, height 6\' 4"  (1.93 m), weight 89.9 kg, SpO2 95 %.        Intake/Output Summary (Last 24 hours) at 04/02/2021 0818 Last data filed at 04/02/2021 0700 Gross per  24 hour  Intake 2637.86 ml  Output 2780 ml  Net -142.14 ml   Filed Weights   03/31/21 0433 04/01/21 0413 04/02/21 0415  Weight: 88.3 kg 88.3 kg 89.9 kg      Examination: Tmax  General appearance:    nad  At Rest 02 sats  98% on RA  No jvd Oropharynx clear,  mucosa nl Neck supple Lungs clear  bilaterally RRR no s3 or or sign murmur Abd soft, nl excursion  Extr warm  with no edema or clubbing noted Neuro  Sensorium intact,  no apparent motor deficits     Resolved Hospital Problem list   DKA AKI  Assessment & Plan:   Hypotension  Acute (shock)  resolved,  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, in which case response to relatively minor insults might cause major bp problems as we've seen here (anemia, osmotic diuresis, low oncotic pressures, and possible infection/ sepsis or sirs from pancreatitis)  -with orthostatic vitals, SBP drops by 10, HR unchanged  - cort stim nl 9/23 P -cont hydrocortisone and florinef  -TID midodrine  -started abx 9/29 though no source and labs really not c/w infection  > d/c 10/1 >>> accept mean bp of 50 and keep tank full  >>> d/c HC and recheck am cortisol 10/2   Polyuria  -did get DDAVP once on 9/23 for possible DI and did have incr in Osm  P - nephrology  felt that pt will likely re-equilibrate  - trend metabolic panel I/Os   Hx EtOH abuse Hx pancreatitis x2, c/b pancreatic necrosis s/p surgical necrosectomy and cystogastrostomy (2012) Hx recurrent pancreatic pseudocyst  Hepatitis panel negative.  Lipase WNL. P -cont pancreatic enzyme sups -multivit   DM2 Hyperglycemia 2/2 pancreatitis  P -SSI > desire tighter control to prevent osmotic diuresis from vol depleting  Neuropathy  Acute back pain  -gaba, tramadol  -mobility -added flexeril which pt has tolerated well previously for back spasms   Severe protein/calorie malnutrition  -poor PTA appetite for a few months with associated 50lb wt loss  P -RDN   Anemia, suspect in setting of nutrient deficiencies  P -Hgb goal > 7   Best Practice  Diet/type: Regular consistency (see orders) DVT prophylaxis: LMWH GI prophylaxis: N/A Lines: N/A Foley:  N/A Code Status:  full code Last date of multidisciplinary goals of care discussion [03/31/2021]   The patient is critically ill with multiple organ systems failure and requires high complexity  decision making for assessment and support, frequent evaluation and titration of therapies, application of advanced monitoring technologies and extensive interpretation of multiple databases. Critical Care Time devoted to patient care services described in this note is 40 minutes.   Sandrea Hughs, MD Pulmonary and Critical Care Medicine [facility] Cell [phone]   After 7:00 pm call Elink  [phone]

## 2021-04-02 NOTE — Progress Notes (Signed)
Per Sandrea Hughs, MD, patient's MAP goal is 50. This RN is titrating phenylephrine drip per titration administration orders to achieve MAP goals.

## 2021-04-02 NOTE — Progress Notes (Signed)
Inpatient Diabetes Program Recommendations  AACE/ADA: New Consensus Statement on Inpatient Glycemic Control (2015)  Target Ranges:  Prepandial:   less than 140 mg/dL      Peak postprandial:   less than 180 mg/dL (1-2 hours)      Critically ill patients:  140 - 180 mg/dL   Lab Results  Component Value Date   GLUCAP 375 (H) 04/02/2021   HGBA1C 11.4 (H) 03/21/2021    Review of Glycemic Control Results for Allen Russell, Allen Russell (MRN [id number]) as of 04/02/2021 09:22  Ref. Range 04/01/2021 07:48 04/01/2021 11:48 04/01/2021 15:36 04/01/2021 21:20 04/02/2021 07:54  Glucose-Capillary Latest Ref Range: 70 - 99 mg/dL 532 (H) 023 (H) 343 (H) 282 (H) 375 (H)   Inpatient Diabetes Program Recommendations:   Discussed diabetes management with pharmacist Bernadene Person. Please consider: -Change Semglee to Levemir 15 units bid -Add Novolog 3 units tid meal coverage if eats 50% -Increase Novolog correction 0-15 units to q 4 hrs.  Thank you, Billy Fischer. Romani Wilbon, RN, MSN, CDE  Diabetes Coordinator Inpatient Glycemic Control Team Team Pager [phone] (8am-5pm) 04/02/2021 9:37 AM

## 2021-04-03 LAB — GLUCOSE, CAPILLARY
Glucose-Capillary: 145 mg/dL — ABNORMAL HIGH (ref 70–99)
Glucose-Capillary: 217 mg/dL — ABNORMAL HIGH (ref 70–99)
Glucose-Capillary: 223 mg/dL — ABNORMAL HIGH (ref 70–99)
Glucose-Capillary: 180 mg/dL — ABNORMAL HIGH (ref 70–99)

## 2021-04-03 LAB — CBC
HCT: 29.1 % — ABNORMAL LOW (ref 39.0–52.0)
Hemoglobin: 9.1 g/dL — ABNORMAL LOW (ref 13.0–17.0)
MCH: 32.2 pg (ref 26.0–34.0)
MCHC: 31.3 g/dL (ref 30.0–36.0)
MCV: 102.8 fL — ABNORMAL HIGH (ref 80.0–100.0)
Platelets: 152 10*3/uL (ref 150–400)
RBC: 2.83 MIL/uL — ABNORMAL LOW (ref 4.22–5.81)
RDW: 17.6 % — ABNORMAL HIGH (ref 11.5–15.5)
WBC: 2.6 10*3/uL — ABNORMAL LOW (ref 4.0–10.5)
nRBC: 0 % (ref 0.0–0.2)

## 2021-04-03 LAB — BASIC METABOLIC PANEL
Anion gap: 4 — ABNORMAL LOW (ref 5–15)
BUN: 23 mg/dL — ABNORMAL HIGH (ref 6–20)
CO2: 25 mmol/L (ref 22–32)
Calcium: 7.9 mg/dL — ABNORMAL LOW (ref 8.9–10.3)
Chloride: 115 mmol/L — ABNORMAL HIGH (ref 98–111)
Creatinine, Ser: 0.58 mg/dL — ABNORMAL LOW (ref 0.61–1.24)
GFR, Estimated: >60 mL/min (ref >60)
Glucose, Bld: 159 mg/dL — ABNORMAL HIGH (ref 70–99)
Potassium: 3.2 mmol/L — ABNORMAL LOW (ref 3.5–5.1)
Sodium: 144 mmol/L (ref 135–145)

## 2021-04-03 LAB — CORTISOL: Cortisol, Plasma: 1.6 ug/dL

## 2021-04-03 MED ORDER — HYDROCORTISONE 20 MG PO TABS
50.0000 mg | ORAL_TABLET | Freq: Two times a day (BID) | ORAL | Status: DC
  Administered 2021-04-03 – 2021-04-04 (×4): 50 mg via ORAL
  Filled 2021-04-03 (×6): qty 1

## 2021-04-03 NOTE — Progress Notes (Addendum)
Patient's BP at 0900 read 69/33 (43).RN to bedside. Patient was asleep at this time. This RN aroused patient. Patient was arousable, alert and asymptomatic.This RN recycled BP again at 0903 and it read 73/28 (42). CCMD aware, MAP goal remains at 50. No new orders at this time.

## 2021-04-03 NOTE — Progress Notes (Signed)
Patient walked 2.5 laps around Intensive care unit (approximately 964ft). Patient remained asymptomatic during walk and BP MAP continues to remain at goal (>50). CCMD made aware and is okay to transfer patient to telemetry.

## 2021-04-03 NOTE — Progress Notes (Signed)
CCMD Dr. Sherene Sires at bedside. Per CCMD, patient should walk today and as long as patient remains asymptomatic while walking, patient is able to be transferred to lower level of care.

## 2021-04-03 NOTE — Progress Notes (Addendum)
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  Some of this related to inability to afford and take his medications including his metformin and his pancreatic enzymes.  He was brought to the ED 9/19 due to acute on chronic profound weakness and deconditioning superimposed on the above.    His initial evaluation was consistent with shock, metabolic panel consistent with DKA, acute renal insufficiency.  He was treated for DKA and for presumed hypovolemic shock, but remained on norepinephrine infusion even after volume resuscitation and improvement in his metabolic status.  PCCM consulted on 9/21.  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 Failure to Thrive Neuropathy Chronic back pain  Significant Hospital Events:  9/19: [facility] ED 9/20: Admit to inpatient SD 9/21: Begin Levophed infusion, PCCM consulted 9/23 tentative dx DI   9/25 rec wean levophed for mean of 50 systemic as long as no symptoms as ambulating on levophed drip, midodrine started 9/26 Levophed weaning slowly.  Weaning stress dose steroids 9/29 trial of changing levo to neo with intention of rechecking coox on neo. Checking Free T3 T4. Adding steroids  Started abx 9/29 night with no other source of shock  Cards consult 9/30 c/w dysautonomia, pos tilt test in 2005   Scheduled Meds:  (feeding supplement) PROSource Plus  30 mL Oral BID BM   ALPRAZolam  0.25 mg Oral Once   Chlorhexidine Gluconate Cloth  6  each Topical Daily   enoxaparin (LOVENOX) injection  40 mg Subcutaneous Q24H   feeding supplement (GLUCERNA SHAKE)  237 mL Oral BID BM   fludrocortisone  0.2 mg Oral Daily   folic acid  1 mg Oral Daily   gabapentin  300 mg Oral BID   insulin aspart  0-15 Units Subcutaneous TID WC   insulin aspart  0-5 Units Subcutaneous QHS   insulin glargine-yfgn  12 Units Subcutaneous BID   lipase/protease/amylase  36,000 Units Oral TID WC   midodrine  15 mg Oral TID WC   multivitamin with minerals  1 tablet Oral Daily   potassium chloride  40 mEq Oral TID   sodium chloride flush  10-40 mL Intracatheter Q12H   thiamine  100 mg Oral Daily   Continuous Infusions:  sodium chloride Stopped (04/02/21 1501)   phenylephrine (NEO-SYNEPHRINE) Adult infusion Stopped (04/02/21 1019)   PRN Meds:.acetaminophen, cyclobenzaprine, dextrose, docusate sodium, guaiFENesin-dextromethorphan, lipase/protease/amylase, melatonin, ondansetron (ZOFRAN) IV, ondansetron, polyethylene glycol, sodium chloride flush, traMADol    Interim History / Subjective:  No symptoms at all even with walking to bathroom  Objective   Blood pressure (!) 93/45, pulse (!) 54, temperature 97.9 F (36.6 C), temperature source Oral, resp. rate 12, height 6\' 4"  (1.93 m), weight 89.9 kg, SpO2 97 %.        Intake/Output Summary (Last 24 hours) at 04/03/2021 0638 Last data filed at 04/03/2021 0500 Gross per 24 hour  Intake 1930.46 ml  Output 2595 ml  Net -664.54 ml   Filed Weights   04/01/21 0413 04/02/21 0415 04/03/21 0430  Weight: 88.3 kg  89.9 kg 89.9 kg      Examination: Tmax  98.5  General appearance:    slt pale /nad   At Rest 02 sats  98% on RA  No jvd Oropharynx clear,  mucosa nl Neck supple Lungs clear bilaterally RRR no s3 or or sign murmur - pulse in 50s with mean bp in 50s  Abd soft nl  excursion  Extr warm with no edema or clubbing noted Neuro  Sensorium alert/approp,  no apparent motor deficits       Resolved  Hospital Problem list   DKA AKI  Assessment & Plan:   Hypotension  Acute (shock)  resolved,  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, in which case response to relatively minor insults might cause major bp problems as we've seen here (anemia, osmotic diuresis, low oncotic pressures, and possible infection/ sepsis or sirs from pancreatitis)  - cort stim nl 9/23 P -cont   florinef  -TID midodrine  -started abx 9/29 though no source and labs really not c/w infection  > d/c 10/1 >>> accept mean bp of 50 and keep tank full  >>> d/c'd  [facility] 10/21 and recheck am cortisol 10/2 very low so resume hydrocortisone po for slower taper in the setting of continued low bp   Polyuria  -did get DDAVP once on 9/23 for possible DI and did have incr in Osm  P - nephrology  felt that pt will likely re-equilibrate  - trend metabolic panel I/Os   Hx EtOH abuse Hx pancreatitis x2, c/b pancreatic necrosis s/p surgical necrosectomy and cystogastrostomy (2012) Hx recurrent pancreatic pseudocyst  Hepatitis panel negative.  Lipase WNL. P -cont pancreatic enzyme sups -multivit   DM2 Hyperglycemia 2/2 pancreatitis  P -SSI > desire tighter control to prevent osmotic diuresis from vol depleting  Neuropathy  Acute back pain  -gaba, tramadol  -mobility -added flexeril which pt has tolerated well previously for back spasms   Severe protein/calorie malnutrition  -poor PTA appetite for a few months with associated 50lb wt loss  P -RDN   Anemia, suspect in setting of nutrient deficiencies  Lab Results  Component Value Date   HGB 9.1 (L) 04/03/2021   HGB 10.6 (L) 03/31/2021   HGB 9.9 (L) 03/28/2021    P -Hgb goal > 7   Best Practice  Diet/type: Regular consistency (see orders) DVT prophylaxis: LMWH GI prophylaxis: N/A Lines: N/A Foley:  N/A Code Status:  full code Last date of multidisciplinary goals of care discussion [03/31/2021]   Pt can go to floor if able to ambulate off pressors / will do  telemetry unit as he tends to get bradycardic  Sandrea Hughs, MD Pulmonary and Critical Care Medicine [facility] Cell [phone]   After 7:00 pm call Elink  [phone]

## 2021-04-04 DIAGNOSIS — G901 Familial dysautonomia [Riley-Day]: Secondary | ICD-10-CM

## 2021-04-04 LAB — URINALYSIS, ROUTINE W REFLEX MICROSCOPIC
Bacteria, UA: NONE SEEN
Bilirubin Urine: NEGATIVE
Glucose, UA: >=500 mg/dL — AB
Hgb urine dipstick: NEGATIVE
Ketones, ur: NEGATIVE mg/dL
Leukocytes,Ua: NEGATIVE
Nitrite: NEGATIVE
Protein, ur: NEGATIVE mg/dL
Specific Gravity, Urine: 1.011 (ref 1.005–1.030)
pH: 6 (ref 5.0–8.0)

## 2021-04-04 LAB — BASIC METABOLIC PANEL
Anion gap: 6 (ref 5–15)
BUN: 24 mg/dL — ABNORMAL HIGH (ref 6–20)
CO2: 27 mmol/L (ref 22–32)
Calcium: 7.8 mg/dL — ABNORMAL LOW (ref 8.9–10.3)
Chloride: 105 mmol/L (ref 98–111)
Creatinine, Ser: 0.68 mg/dL (ref 0.61–1.24)
GFR, Estimated: >60 mL/min (ref >60)
Glucose, Bld: 205 mg/dL — ABNORMAL HIGH (ref 70–99)
Potassium: 3.2 mmol/L — ABNORMAL LOW (ref 3.5–5.1)
Sodium: 138 mmol/L (ref 135–145)

## 2021-04-04 LAB — GLUCOSE, CAPILLARY
Glucose-Capillary: 170 mg/dL — ABNORMAL HIGH (ref 70–99)
Glucose-Capillary: 246 mg/dL — ABNORMAL HIGH (ref 70–99)
Glucose-Capillary: 345 mg/dL — ABNORMAL HIGH (ref 70–99)
Glucose-Capillary: 181 mg/dL — ABNORMAL HIGH (ref 70–99)

## 2021-04-04 LAB — CBC
HCT: 29.4 % — ABNORMAL LOW (ref 39.0–52.0)
Hemoglobin: 9.5 g/dL — ABNORMAL LOW (ref 13.0–17.0)
MCH: 32.6 pg (ref 26.0–34.0)
MCHC: 32.3 g/dL (ref 30.0–36.0)
MCV: 101 fL — ABNORMAL HIGH (ref 80.0–100.0)
Platelets: 152 10*3/uL (ref 150–400)
RBC: 2.91 MIL/uL — ABNORMAL LOW (ref 4.22–5.81)
RDW: 17.2 % — ABNORMAL HIGH (ref 11.5–15.5)
WBC: 2.4 10*3/uL — ABNORMAL LOW (ref 4.0–10.5)
nRBC: 0 % (ref 0.0–0.2)

## 2021-04-04 LAB — MAGNESIUM: Magnesium: 1.9 mg/dL (ref 1.7–2.4)

## 2021-04-04 LAB — PHOSPHORUS: Phosphorus: 4.2 mg/dL (ref 2.5–4.6)

## 2021-04-04 MED ORDER — INSULIN ASPART 100 UNIT/ML IJ SOLN
3.0000 [IU] | Freq: Three times a day (TID) | INTRAMUSCULAR | Status: DC
  Administered 2021-04-04 – 2021-04-07 (×8): 3 [IU] via SUBCUTANEOUS

## 2021-04-04 MED ORDER — SODIUM CHLORIDE 0.9 % IV SOLN: INTRAVENOUS | Status: AC

## 2021-04-04 MED ORDER — MAGNESIUM SULFATE 2 GM/50ML IV SOLN
2.0000 g | Freq: Once | INTRAVENOUS | Status: AC
  Administered 2021-04-04: 2 g via INTRAVENOUS
  Filled 2021-04-04: qty 50

## 2021-04-04 NOTE — Progress Notes (Signed)
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  Some of this related to inability to afford and take his medications including his metformin and his pancreatic enzymes.  He was brought to the ED 9/19 due to acute on chronic profound weakness and deconditioning superimposed on the above.    His initial evaluation was consistent with shock, metabolic panel consistent with DKA, acute renal insufficiency.  He was treated for DKA and for presumed hypovolemic shock, but remained on norepinephrine infusion even after volume resuscitation and improvement in his metabolic status.  PCCM consulted on 9/21.  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 Failure to Thrive Neuropathy Chronic back pain  Significant Hospital Events:  9/19: [facility] ED 9/20: Admit to inpatient SD 9/21: Begin Levophed infusion, PCCM consulted 9/23 tentative dx DI   9/25 rec wean levophed for mean of 50 systemic as long as no symptoms as ambulating on levophed drip, midodrine started 9/26 Levophed weaning slowly.  Weaning stress dose steroids 9/29 trial of changing levo to neo with intention of rechecking coox on neo. Checking Free T3 T4. Adding steroids  Started abx 9/29 night with no other source of shock  Cards consult 9/30 c/w dysautonomia, pos tilt test in 2005  10/1 abx stopped 10/2 added back solucortef    Interim History / Subjective:   Objective   Blood pressure (Abnormal) 84/39, pulse  (Abnormal) 59, temperature 97.7 F (36.5 C), temperature source Oral, resp. rate (Abnormal) 22, height 6\' 4"  (1.93 m), weight 96.7 kg, SpO2 96 %.        Intake/Output Summary (Last 24 hours) at 04/04/2021 1243 Last data filed at 04/04/2021 1100 Gross per 24 hour  Intake 240 ml  Output 3750 ml  Net -3510 ml   Filed Weights   04/02/21 0415 04/03/21 0430 04/04/21 0500  Weight: 89.9 kg 89.9 kg 96.7 kg      Examination:  General resting in bed. No distress Hent NCAT no JVD Pulm cl dec bases Card rrr Abd soft  Extremities: Warm dry brisk cap refill does have dependent edema mild anasarca Neuro: Awake oriented no focal deficits GU: Voiding quantity sufficient.    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, in which case response to relatively minor insults might cause major bp problems as we've seen here (anemia, osmotic diuresis, low oncotic pressures, and possible infection/ sepsis or sirs from pancreatitis)  - cort stim nl 9/23 - abx stopped 10/1 Plan Cont slow hydrocort taper Cont florinef  Tid midodrine Avoid volume depletion Accept MAP > 50 or sbp > 85   Polyuria  -did get DDAVP once on 9/23 for possible DI and did have incr in Osm  - nephrology  felt that pt will likely re-equilibrate  Plan Trend chems   Fluid and electrolyte imbalance: hypokalemia Plan Replace and recheck  Hx EtOH abuse Hx pancreatitis x2, c/b pancreatic necrosis s/p surgical necrosectomy and cystogastrostomy (2012) Hx recurrent  pancreatic pseudocyst  Hepatitis panel negative.  Lipase WNL. Plan Cont pancreatic enzymes   DM2 Hyperglycemia 2/2 pancreatitis  Plan Ssi w/ goal for tight coverage to prevent osmotic diuresis  Adding meal time coverage  Neuropathy  Acute back pain  Plan Cont gabapentin Cont PRN tramadol Mobilize  Prn flexeril    Severe protein/calorie malnutrition   -poor PTA appetite for a few months with associated 50lb wt loss  Plan Followed by RD -RDN   Anemia, suspect in setting of nutrient deficiencies  Plan  Trend   Best Practice  Diet/type: Regular consistency (see orders) DVT prophylaxis: LMWH GI prophylaxis: N/A Lines: N/A Foley:  N/A Code Status:  full code Last date of multidisciplinary goals of care discussion [03/31/2021]  Ready for transfer  Simonne Martinet ACNP-BC [facility] Pulmonary/Critical Care Pager # [phone] OR # [phone] if no answer

## 2021-04-04 NOTE — Progress Notes (Signed)
Inpatient Diabetes Program Recommendations  AACE/ADA: New Consensus Statement on Inpatient Glycemic Control (2015)  Target Ranges:  Prepandial:   less than 140 mg/dL      Peak postprandial:   less than 180 mg/dL (1-2 hours)      Critically ill patients:  140 - 180 mg/dL   Lab Results  Component Value Date   GLUCAP 170 (H) 04/04/2021   HGBA1C 11.4 (H) 03/21/2021    Review of Glycemic Control Results for Allen Russell, Allen Russell (MRN [id number]) as of 04/04/2021 09:19  Ref. Range 04/03/2021 07:33 04/03/2021 11:43 04/03/2021 16:20 04/03/2021 21:11 04/04/2021 08:31  Glucose-Capillary Latest Ref Range: 70 - 99 mg/dL 536 (H) 468 (H) 032 (H) 180 (H) 170 (H)   Current medication for Inpatient Glucose Management: Semglee 12 units bid Novolog 0-15 units tid + hs  Glucerna bid between meals Florinef 0.2 mg Daily Cortef 50 mg bid  Inpatient Diabetes Program Recommendations:    Please consider: -Add Novolog 3 units tid meal coverage if eats 50%   Thanks,  Christena Deem RN, MSN, BC-ADM Inpatient Diabetes Coordinator Team Pager [phone] (8a-5p)

## 2021-04-04 NOTE — Progress Notes (Signed)
Progress Note  Patient Name: Allen Russell Date of Encounter: 04/04/2021 PCP:  Steve Rattler, PA-C Cardiologist: New to [facility]  Subjective   Allen Russell is doing well today. He is ambulating well. 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 Since then he notes mild LH, seeing spots with standing but otherwise no issues. He has had no recent syncopal events. He denies any dyspnea on exertion or angina.  Inpatient Medications    Scheduled Meds:  (feeding supplement) PROSource Plus  30 mL Oral BID BM   ALPRAZolam  0.25 mg Oral Once   Chlorhexidine Gluconate Cloth  6 each Topical Daily   enoxaparin (LOVENOX) injection  40 mg Subcutaneous Q24H   feeding supplement (GLUCERNA SHAKE)  237 mL Oral BID BM   fludrocortisone  0.2 mg Oral Daily   folic acid  1 mg Oral Daily   gabapentin  300 mg Oral BID   hydrocortisone  50 mg Oral BID   insulin aspart  0-15 Units Subcutaneous TID WC   insulin aspart  0-5 Units Subcutaneous QHS   insulin aspart  3 Units Subcutaneous TID WC   insulin glargine-yfgn  12 Units Subcutaneous BID   lipase/protease/amylase  36,000 Units Oral TID WC   midodrine  15 mg Oral TID WC   multivitamin with minerals  1 tablet Oral Daily   potassium chloride  40 mEq Oral TID   sodium chloride flush  10-40 mL Intracatheter Q12H   thiamine  100 mg Oral Daily   Continuous Infusions:  sodium chloride Stopped (04/02/21 1501)   phenylephrine (NEO-SYNEPHRINE) Adult infusion Stopped (04/02/21 1019)   PRN Meds: acetaminophen, cyclobenzaprine, dextrose, docusate sodium, guaiFENesin-dextromethorphan, lipase/protease/amylase, melatonin, ondansetron (ZOFRAN) IV, ondansetron, polyethylene glycol, sodium chloride flush, traMADol   Vital Signs    Vitals:   04/04/21 1300 04/04/21 1452 04/04/21 1455 04/04/21 1458  BP: (!) 103/53 (!) 93/45 (!) 92/51 (!)  82/49  Pulse: 61 60 70 72  Resp: 20 16 17 17   Temp:      TempSrc:      SpO2: 99% 93% 96% 97%  Weight:      Height:        Intake/Output Summary (Last 24 hours) at 04/04/2021 1459 Last data filed at 04/04/2021 1100 Gross per 24 hour  Intake 240 ml  Output 3750 ml  Net -3510 ml   Last 3 Weights 04/04/2021 04/03/2021 04/02/2021  Weight (lbs) 213 lb 3 oz 198 lb 3.1 oz 198 lb 3.1 oz  Weight (kg) 96.7 kg 89.9 kg 89.9 kg      Telemetry    Sinus rhythm HR 50s. Had rate response to the 70s with ambulation. Personally Reviewed  ECG    Sinus bradycardia HR 45 bpm, Qtc 368 ms, no ST-T changes - Personally Reviewed  Physical Exam   GEN: No acute distress.   Neck: No JVD Cardiac: RRR, no murmurs, rubs, or gallops.  VASC: radial pulses 2+ BL, DP 2+ BL Respiratory: Clear to auscultation bilaterally. GI: Soft, nontender, non-distended  MS: No edema; No deformity. Neuro:  Nonfocal  Skin: warm and well perfused Psych: Normal affect   Labs    High Sensitivity Troponin:   Recent Labs  Lab 03/25/21 0525 03/25/21 0750  TROPONINIHS 5 4     Chemistry Recent Labs  Lab 03/30/21 1435 04/03/21 0458 04/04/21 0507  NA 137 144 138  K  4.0 3.2* 3.2*  CL 100 115* 105  CO2 29 25 27   GLUCOSE 260* 159* 205*  BUN 20 23* 24*  CREATININE 0.94 0.58* 0.68  CALCIUM 8.2* 7.9* 7.8*  MG  --   --  1.9  GFRNONAA >60 >60 >60  ANIONGAP 8 4* 6    Lipids No results for input(s): CHOL, TRIG, HDL, LABVLDL, LDLCALC, CHOLHDL in the last 168 hours.  Hematology Recent Labs  Lab 03/31/21 0840 04/03/21 0458 04/04/21 0507  WBC 5.6 2.6* 2.4*  RBC 3.34* 2.83* 2.91*  HGB 10.6* 9.1* 9.5*  HCT 33.3* 29.1* 29.4*  MCV 99.7 102.8* 101.0*  MCH 31.7 32.2 32.6  MCHC 31.8 31.3 32.3  RDW 17.9* 17.6* 17.2*  PLT 254 152 152   Thyroid  Recent Labs  Lab 03/31/21 0840  FREET4 0.94    BNPNo results for input(s): BNP, PROBNP in the last 168 hours.  DDimer No results for input(s): DDIMER in the last 168 hours.    Radiology    CT Chest: LAD coronary artery calcification  Cardiac Studies   TTE 03/23/2021  1. Left ventricular ejection fraction, by estimation, is 50 to 55%. The  left ventricle has low normal function. The left ventricle has no regional  wall motion abnormalities. Left ventricular diastolic parameters were  normal.   2. Right ventricular systolic function is mildly reduced. The right  ventricular size is moderately enlarged.   3. The mitral valve is normal in structure. Trivial mitral valve  regurgitation. No evidence of mitral stenosis.   4. The aortic valve is grossly normal. There is mild calcification of the  aortic valve. Aortic valve regurgitation is not visualized. No aortic  stenosis is present.   5. Aortic dilatation noted. There is mild dilatation of the aortic root,  measuring 42 mm.   Tilt Table Test 11/20/2003 Dr. 11/22/2003 not able to obtain  Patient Profile     60 y.o. 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 Low concern for ischemic cause with no angina (CT does show some diffuse CAC in LAD/not quantified), normal EF, no RWMA. He had negative blood cultures. DKA is resolved. He has poorly controlled DMII with A1c 11.4 which is concerning for possible etiology of dysautonomia and ETOH. 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 2006). He's had some mild symptoms since then. Will plan to  explore additional etiologies of hypotension related to autonomic neuropathy. Can consider referral to [facility] to the dysautonomia center as an outpatient. He does not have a primary cardiologist ---> rule out orthostatic hypotension with plan for orthostatics today --> continue florinef 0.2 mg daily --> continue midodrine typically max dose 10 mg TID ( not prior to bedtime) --> increase fluid/ salt intake (mindful of solute intake with ?DI) --> compression stockings   For questions or updates, please contact [facility] Please consult [email or web address] for contact info under        Signed, Ladona Ridgel, MD , MS 04/04/2021, 2:59 PM

## 2021-04-05 DIAGNOSIS — E8729 Other acidosis: Secondary | ICD-10-CM

## 2021-04-05 LAB — COMPREHENSIVE METABOLIC PANEL
ALT: 27 U/L (ref 0–44)
AST: 20 U/L (ref 15–41)
Albumin: 2.7 g/dL — ABNORMAL LOW (ref 3.5–5.0)
Alkaline Phosphatase: 42 U/L (ref 38–126)
Anion gap: 4 — ABNORMAL LOW (ref 5–15)
BUN: 20 mg/dL (ref 6–20)
CO2: 27 mmol/L (ref 22–32)
Calcium: 7.9 mg/dL — ABNORMAL LOW (ref 8.9–10.3)
Chloride: 110 mmol/L (ref 98–111)
Creatinine, Ser: 0.71 mg/dL (ref 0.61–1.24)
GFR, Estimated: >60 mL/min (ref >60)
Glucose, Bld: 271 mg/dL — ABNORMAL HIGH (ref 70–99)
Potassium: 3.3 mmol/L — ABNORMAL LOW (ref 3.5–5.1)
Sodium: 141 mmol/L (ref 135–145)
Total Bilirubin: 0.7 mg/dL (ref 0.3–1.2)
Total Protein: 4.6 g/dL — ABNORMAL LOW (ref 6.5–8.1)

## 2021-04-05 LAB — GLUCOSE, CAPILLARY
Glucose-Capillary: 235 mg/dL — ABNORMAL HIGH (ref 70–99)
Glucose-Capillary: 184 mg/dL — ABNORMAL HIGH (ref 70–99)
Glucose-Capillary: 322 mg/dL — ABNORMAL HIGH (ref 70–99)
Glucose-Capillary: 215 mg/dL — ABNORMAL HIGH (ref 70–99)

## 2021-04-05 MED ORDER — FLUDROCORTISONE ACETATE 0.1 MG PO TABS
0.1000 mg | ORAL_TABLET | Freq: Two times a day (BID) | ORAL | Status: DC
  Administered 2021-04-05 – 2021-04-06 (×3): 0.1 mg via ORAL
  Filled 2021-04-05 (×4): qty 1

## 2021-04-05 MED ORDER — POTASSIUM CHLORIDE CRYS ER 20 MEQ PO TBCR
20.0000 meq | EXTENDED_RELEASE_TABLET | ORAL | Status: AC
Start: 2021-04-05 — End: 2021-04-05
  Administered 2021-04-05 (×2): 20 meq via ORAL
  Filled 2021-04-05 (×2): qty 1

## 2021-04-05 MED ORDER — SODIUM CHLORIDE 0.9 % NICU IV INFUSION SIMPLE: INJECTION | INTRAVENOUS | Status: DC

## 2021-04-05 MED ORDER — INSULIN GLARGINE-YFGN 100 UNIT/ML ~~LOC~~ SOLN
15.0000 [IU] | Freq: Two times a day (BID) | SUBCUTANEOUS | Status: DC
  Administered 2021-04-05 – 2021-04-07 (×5): 15 [IU] via SUBCUTANEOUS
  Filled 2021-04-05 (×6): qty 0.15

## 2021-04-05 MED ORDER — POTASSIUM CHLORIDE 10 MEQ/50ML IV SOLN
10.0000 meq | INTRAVENOUS | Status: AC
  Administered 2021-04-05 (×4): 10 meq via INTRAVENOUS
  Filled 2021-04-05 (×4): qty 50

## 2021-04-05 MED ORDER — SODIUM CHLORIDE 0.9 % IV SOLN: INTRAVENOUS | Status: AC

## 2021-04-05 MED ORDER — HYDROCORTISONE 20 MG PO TABS
50.0000 mg | ORAL_TABLET | Freq: Three times a day (TID) | ORAL | Status: DC
  Administered 2021-04-05 – 2021-04-07 (×6): 50 mg via ORAL
  Filled 2021-04-05 (×11): qty 1

## 2021-04-05 MED ORDER — MIDODRINE HCL 5 MG PO TABS
10.0000 mg | ORAL_TABLET | Freq: Three times a day (TID) | ORAL | Status: DC
  Administered 2021-04-05 – 2021-04-06 (×5): 10 mg via ORAL
  Filled 2021-04-05 (×5): qty 2

## 2021-04-05 NOTE — Progress Notes (Signed)
Progress Note  Patient Name: Allen Russell Date of Encounter: 04/05/2021 PCP:  Steve Rattler, PA-C Cardiologist: New to [facility]  Subjective   Allen Russell  is ambulating well, he walked three blocks around the unit. He feels well.   Interval hx: His MAPs and HR have been good. He's still sinus brady in the 50s but shows rate response. His fluids will stop today at 4pm   Inpatient Medications    Scheduled Meds:  (feeding supplement) PROSource Plus  30 mL Oral BID BM   ALPRAZolam  0.25 mg Oral Once   Chlorhexidine Gluconate Cloth  6 each Topical Daily   enoxaparin (LOVENOX) injection  40 mg Subcutaneous Q24H   feeding supplement (GLUCERNA SHAKE)  237 mL Oral BID BM   fludrocortisone  0.1 mg Oral BID   folic acid  1 mg Oral Daily   gabapentin  300 mg Oral BID   hydrocortisone  50 mg Oral TID   insulin aspart  0-15 Units Subcutaneous TID WC   insulin aspart  0-5 Units Subcutaneous QHS   insulin aspart  3 Units Subcutaneous TID WC   insulin glargine-yfgn  15 Units Subcutaneous BID   lipase/protease/amylase  36,000 Units Oral TID WC   midodrine  10 mg Oral TID WC   multivitamin with minerals  1 tablet Oral Daily   sodium chloride flush  10-40 mL Intracatheter Q12H   thiamine  100 mg Oral Daily   Continuous Infusions:  sodium chloride 125 mL/hr at 04/05/21 0742   sodium chloride 150 mL/hr at 04/05/21 1318   PRN Meds: acetaminophen, cyclobenzaprine, dextrose, docusate sodium, guaiFENesin-dextromethorphan, lipase/protease/amylase, melatonin, ondansetron (ZOFRAN) IV, ondansetron, polyethylene glycol, sodium chloride flush, traMADol   Vital Signs    Vitals:   04/05/21 0750 04/05/21 0805 04/05/21 0926 04/05/21 1404  BP: 109/62 (!) 121/54 107/67 115/64  Pulse: (!) 55  64 69  Resp: 14 (!) 22 16 18   Temp:    (!) 97.4 F (36.3 C)  TempSrc:    Oral  SpO2: 99%  100% 98%  Weight:      Height:        Intake/Output Summary (Last 24 hours) at 04/05/2021 1503 Last data  filed at 04/05/2021 0800 Gross per 24 hour  Intake 865.51 ml  Output 1350 ml  Net -484.49 ml   Last 3 Weights 04/05/2021 04/04/2021 04/03/2021  Weight (lbs) 208 lb 8.9 oz 213 lb 3 oz 198 lb 3.1 oz  Weight (kg) 94.6 kg 96.7 kg 89.9 kg      Telemetry    Sinus rhythm HR 50s. . Personally Reviewed  ECG    03/25/2021 Sinus bradycardia HR 45 bpm, Qtc 368 ms, no ST-T changes - Personally Reviewed  Physical Exam   GEN: No acute distress.  Sitting up Neck: No JVD Cardiac: RRR, no murmurs, rubs, or gallops.  VASC: radial pulses 2+ BL, DP 2+ BL Respiratory: Clear to auscultation bilaterally. GI: Soft, nontender, non-distended  MS: No edema; No deformity. Neuro:  Nonfocal  Skin: warm and well perfused Psych: Normal affect   Labs    High Sensitivity Troponin:   Recent Labs  Lab 03/25/21 0525 03/25/21 0750  TROPONINIHS 5 4     Chemistry Recent Labs  Lab 04/03/21 0458 04/04/21 0507 04/05/21 0249  NA 144 138 141  K 3.2* 3.2* 3.3*  CL 115* 105 110  CO2 25 27 27   GLUCOSE 159* 205* 271*  BUN 23* 24* 20  CREATININE 0.58* 0.68 0.71  CALCIUM  7.9* 7.8* 7.9*  MG  --  1.9  --   PROT  --   --  4.6*  ALBUMIN  --   --  2.7*  AST  --   --  20  ALT  --   --  27  ALKPHOS  --   --  42  BILITOT  --   --  0.7  GFRNONAA >60 >60 >60  ANIONGAP 4* 6 4*    Lipids No results for input(s): CHOL, TRIG, HDL, LABVLDL, LDLCALC, CHOLHDL in the last 168 hours.  Hematology Recent Labs  Lab 03/31/21 0840 04/03/21 0458 04/04/21 0507  WBC 5.6 2.6* 2.4*  RBC 3.34* 2.83* 2.91*  HGB 10.6* 9.1* 9.5*  HCT 33.3* 29.1* 29.4*  MCV 99.7 102.8* 101.0*  MCH 31.7 32.2 32.6  MCHC 31.8 31.3 32.3  RDW 17.9* 17.6* 17.2*  PLT 254 152 152   Thyroid  Recent Labs  Lab 03/31/21 0840  FREET4 0.94    BNPNo results for input(s): BNP, PROBNP in the last 168 hours.  DDimer No results for input(s): DDIMER in the last 168 hours.   Radiology    CT Chest: LAD coronary artery calcification  Cardiac Studies    TTE 03/23/2021  1. Left ventricular ejection fraction, by estimation, is 50 to 55%. The  left ventricle has low normal function. The left ventricle has no regional  wall motion abnormalities. Left ventricular diastolic parameters were  normal.   2. Right ventricular systolic function is mildly reduced. The right  ventricular size is moderately enlarged.   3. The mitral valve is normal in structure. Trivial mitral valve  regurgitation. No evidence of mitral stenosis.   4. The aortic valve is grossly normal. There is mild calcification of the  aortic valve. Aortic valve regurgitation is not visualized. No aortic  stenosis is present.   5. Aortic dilatation noted. There is mild dilatation of the aortic root,  measuring 42 mm.   Tilt Table Test 11/20/2003 Dr. Angelia Mould not able to obtain  Patient Profile     60 y.o. 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 Low concern for ischemic cause with no angina (CT does show some diffuse CAC in LAD/not quantified), normal EF, no RWMA. He had negative blood cultures. DKA is resolved. He has poorly controlled DMII with A1c 11.4 which is concerning for possible etiology of dysautonomia and ETOH. 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 Ladona Ridgel). He's had some mild symptoms since then. Will plan to explore additional etiologies of hypotension related to autonomic neuropathy. Can  consider referral to [facility] to the dys[facility]onomia center as an outpatient. He does not have a primary cardiologist  He can continue midodrine and florinef for possible autonomic neuropathy upon discharge. Otherwise, his fluids will stop today. Would encourage oral hydration. If Bps ok overnight, do not see a reason to keep him here from a cardiac perspective. He can follow-up with [place] in clinic with either myself, Dr. Tenny Craw or Dr. Cristal Deer. Would consider having  him see endocrinology as well. --> no changes today ---> negative orthostatics --> continue florinef 0.2 mg daily --> continue midodrine typically max dose 10 mg TID ( not prior to bedtime) -> continued on hydrocortisone 50 mg TID with low cortisol  --> increase fluid/ salt intake (mindful of solute intake with ?DI) --> compression stockings    For questions or updates, please contact [facility] Please consult [email or web address] for contact info under        Signed, Maisie Fus, MD , MS 04/05/2021, 3:03 PM

## 2021-04-05 NOTE — TOC Progression Note (Signed)
Transition of Care [facility]) - Progression Note    Patient Details  Name: Allen Russell MRN: [id number] Date of Birth: 05-01-1961  Transition of Care [facility]) CM/SW Contact  Golda Acre, RN Phone Number: 04/05/2021, 8:08 AM  Clinical Narrative:    Remains hypotensive, on iv neo drip, and kcl runs x4.   Expected Discharge Plan: Home/Self Care Barriers to Discharge: Continued Medical Work up  Expected Discharge Plan and Services Expected Discharge Plan: Home/Self Care   Discharge Planning Services: CM Consult   Living arrangements for the past 2 months: Single Family Home                                       Social Determinants of Health (SDOH) Interventions    Readmission Risk Interventions No flowsheet data found.

## 2021-04-05 NOTE — Plan of Care (Signed)
  Problem: Nutritional: Goal: Maintenance of adequate nutrition will improve Outcome: Progressing   Problem: Health Behavior/Discharge Planning: Goal: Ability to manage health-related needs will improve Outcome: Progressing   Problem: Clinical Measurements: Goal: Ability to maintain clinical measurements within normal limits will improve Outcome: Progressing Goal: Diagnostic test results will improve Outcome: Progressing   Problem: Activity: Goal: Risk for activity intolerance will decrease Outcome: Progressing   Problem: Nutrition: Goal: Adequate nutrition will be maintained Outcome: Progressing

## 2021-04-05 NOTE — Progress Notes (Signed)
PROGRESS NOTE    Allen Russell  VIF:[id number] DOB: February 05, 1961 DOA: 03/21/2021 PCP: Steve Rattler, PA-C    Brief Narrative:  Allen Russell was admitted to the hospital with the working diagnosis of diabetes ketoacidosis, complicated with undifferentiated shock, adrenal insufficiency, dysautonomia.   60 year old male with past medical history for type 2 diabetes mellitus, peripheral neuropathy, alcohol abuse, alcoholic pancreatitis, status post surgical necrosectomy and cystogastrostomy in 2012.  L2-L3 posterior spinal fusion who presented with weight loss, weakness and dyspnea and exertion. 50 pound weight loss over the prior 2-3 months prior to hospitalization.  Very poor oral intake. On his initial physical examination he was afebrile, heart rate 75, blood pressure 100/79, oxygen saturation 100%, respiratory rate 20, his lungs were clear to auscultation, he was tachypneic, heart S1-S2, present, rhythmic, abdomen soft nontender, no lower extremity edema.  His arterial pH was 7.19, PCO2 less than 19, PO2 75, bicarb 4.3. Sodium 135, potassium 3.2, chloride 106, bicarb 9, glucose 363, BUN 24, creatinine 1.35.  Anion gap 20. White count 5.0, hemoglobin 14.7, hematocrit 41.8, platelets 202. SARS COVID-19 negative.  Urinalysis specific gravity 1.025, 30 protein, > 500 glucose.  Chest radiograph no infiltrates.  CT chest/ abdomen and pelvis  with no signs of malignancy.  Trace bilateral pleural effusions.  Sequela of chronic calcific pancreatitis.  Anterior pancreatic body pseudocyst.  Proximal gastric underdistention.  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  EKG 59 bpm, normal axis, interventricular conduction delay, normal QTC, sinus rhythm, low voltage, no significant ST segment or T wave changes.  Patient was placed on insulin infusion along with volume repletion. His ketoacidosis resolved but he remained hypotensive. He was  placed on vasopressors to maintain hemodynamic stability.  He had a prolonged weaning of vasopressors, he was placed on stress dose steroids for suspected adrenal insufficiency. Added midodrine for autonomic dysautonomia. Cardiology was consulted.  Patient transferred to [facility] 10/4.  Assessment & Plan:   Active Problems:   Ketoacidosis   Protein-calorie malnutrition, severe   Shock (HCC)   Dysautonomia (HCC)  DKA, T2DM. Patient is tolerating po well, no nausea or vomiting.  His ketoacidosis has resolved.   Fasting glucose this am is 271. Continue glucose control with basal insulin and insulin sliding scale.   2. Hypotension, adrenal insufficiency/ dysautonomia. Systolic blood pressure 89 to 147 mmHg. Patient with no dizziness or light headedness.   Continue blood pressure monitoring, keep MAP 60 or above. Continue with high dose steroids.  Stim test showed a sub optimal response to his shock state, consistent with adrenal insufficiency.  On midodrine 10 mg tid.   Echocardiogram with preserved LV systolic function, RV with moderate enlargement cavity, with mild reduction in systolic function.   3. Alcohol abuse. No signs of acute withdrawal. Continue neuro checks per unit protocol.    4. Severe protein calorie malnutrition/ chronic pancreatic insufficiency. Continue with nutritional supplementation.  Continue with pancreatic enzymes.   5. Chronic anemia. Cell count has been stable.   6. AKI, hypokalemia  Renal function with serum cr 0,71, K is 3,3 and Na 141. Continue K correction with Kcl, follow up renal function in am.    Status is: Inpatient  Remains inpatient appropriate because:Inpatient level of care appropriate due to severity of illness  Dispo: The patient is from: Home              Anticipated d/c is to: Home  Patient currently is not medically stable to d/c.   Difficult to place patient No  DVT prophylaxis: Enoxaparin   Code Status:    full   Family Communication:   No family at the bedside      Nutrition Status: Nutrition Problem: Severe Malnutrition Etiology: social / environmental circumstances Signs/Symptoms: severe fat depletion, severe muscle depletion Interventions: Glucerna shake, Prostat, MVI     Consultants:  Cardiology     Subjective: Patient with no nausea or vomiting, no chest pain or dyspnea, no dizziness or light headedness.   Objective: Vitals:   04/05/21 0700 04/05/21 0750 04/05/21 0805 04/05/21 0926  BP:  109/62 (!) 121/54 107/67  Pulse: (!) 54 (!) 55  64  Resp: 15 14 (!) 22 16  Temp:      TempSrc:      SpO2: 94% 99%  100%  Weight:      Height:        Intake/Output Summary (Last 24 hours) at 04/05/2021 1144 Last data filed at 04/05/2021 0800 Gross per 24 hour  Intake 865.51 ml  Output 1650 ml  Net -784.49 ml   Filed Weights   04/03/21 0430 04/04/21 0500 04/05/21 0500  Weight: 89.9 kg 96.7 kg 94.6 kg    Examination:   General: Not in pain or dyspnea, Neurology: Awake and alert, non focal  E ENT: no pallor, no icterus, oral mucosa moist Cardiovascular: No JVD. S1-S2 present, rhythmic, no gallops, rubs, or murmurs.  Trace lower extremity edema. Pulmonary: positive breath sounds bilaterally, adequate air movement, no wheezing, rhonchi or rales. Gastrointestinal. Abdomen soft and non tender Skin. No rashes Musculoskeletal: no joint deformities     Data Reviewed: I have personally reviewed following labs and imaging studies  CBC: Recent Labs  Lab 03/31/21 0840 04/03/21 0458 04/04/21 0507  WBC 5.6 2.6* 2.4*  HGB 10.6* 9.1* 9.5*  HCT 33.3* 29.1* 29.4*  MCV 99.7 102.8* 101.0*  PLT 254 152 152   Basic Metabolic Panel: Recent Labs  Lab 03/30/21 1435 04/03/21 0458 04/04/21 0507 04/05/21 0249  NA 137 144 138 141  K 4.0 3.2* 3.2* 3.3*  CL 100 115* 105 110  CO2 29 25 27 27   GLUCOSE 260* 159* 205* 271*  BUN 20 23* 24* 20  CREATININE 0.94 0.58* 0.68 0.71  CALCIUM 8.2*  7.9* 7.8* 7.9*  MG  --   --  1.9  --   PHOS  --   --  4.2  --    GFR: Estimated Creatinine Clearance: 125.1 mL/min (by C-G formula based on SCr of 0.71 mg/dL). Liver Function Tests: Recent Labs  Lab 04/05/21 0249  AST 20  ALT 27  ALKPHOS 42  BILITOT 0.7  PROT 4.6*  ALBUMIN 2.7*   No results for input(s): LIPASE, AMYLASE in the last 168 hours. No results for input(s): AMMONIA in the last 168 hours. Coagulation Profile: No results for input(s): INR, PROTIME in the last 168 hours. Cardiac Enzymes: No results for input(s): CKTOTAL, CKMB, CKMBINDEX, TROPONINI in the last 168 hours. BNP (last 3 results) No results for input(s): PROBNP in the last 8760 hours. HbA1C: No results for input(s): HGBA1C in the last 72 hours. CBG: Recent Labs  Lab 04/04/21 0831 04/04/21 1153 04/04/21 1751 04/04/21 2126 04/05/21 0754  GLUCAP 170* 246* 345* 181* 235*   Lipid Profile: No results for input(s): CHOL, HDL, LDLCALC, TRIG, CHOLHDL, LDLDIRECT in the last 72 hours. Thyroid Function Tests: No results for input(s): TSH, T4TOTAL, FREET4, T3FREE, THYROIDAB in the  last 72 hours. Anemia Panel: No results for input(s): VITAMINB12, FOLATE, FERRITIN, TIBC, IRON, RETICCTPCT in the last 72 hours.    Radiology Studies: I have reviewed all of the imaging during this hospital visit personally     Scheduled Meds:  (feeding supplement) PROSource Plus  30 mL Oral BID BM   ALPRAZolam  0.25 mg Oral Once   Chlorhexidine Gluconate Cloth  6 each Topical Daily   enoxaparin (LOVENOX) injection  40 mg Subcutaneous Q24H   feeding supplement (GLUCERNA SHAKE)  237 mL Oral BID BM   fludrocortisone  0.1 mg Oral BID   folic acid  1 mg Oral Daily   gabapentin  300 mg Oral BID   hydrocortisone  50 mg Oral TID   insulin aspart  0-15 Units Subcutaneous TID WC   insulin aspart  0-5 Units Subcutaneous QHS   insulin aspart  3 Units Subcutaneous TID WC   insulin glargine-yfgn  15 Units Subcutaneous BID    lipase/protease/amylase  36,000 Units Oral TID WC   midodrine  10 mg Oral TID WC   multivitamin with minerals  1 tablet Oral Daily   potassium chloride  20 mEq Oral Q4H   sodium chloride flush  10-40 mL Intracatheter Q12H   thiamine  100 mg Oral Daily   Continuous Infusions:  sodium chloride 125 mL/hr at 04/05/21 0742   sodium chloride 150 mL/hr at 04/05/21 0800     LOS: 15 days        Silvina Hackleman Annett Gula, MD

## 2021-04-06 LAB — GLUCOSE, CAPILLARY
Glucose-Capillary: 209 mg/dL — ABNORMAL HIGH (ref 70–99)
Glucose-Capillary: 120 mg/dL — ABNORMAL HIGH (ref 70–99)
Glucose-Capillary: 201 mg/dL — ABNORMAL HIGH (ref 70–99)
Glucose-Capillary: 238 mg/dL — ABNORMAL HIGH (ref 70–99)

## 2021-04-06 LAB — BASIC METABOLIC PANEL
Anion gap: 5 (ref 5–15)
BUN: 17 mg/dL (ref 6–20)
CO2: 27 mmol/L (ref 22–32)
Calcium: 8 mg/dL — ABNORMAL LOW (ref 8.9–10.3)
Chloride: 109 mmol/L (ref 98–111)
Creatinine, Ser: 0.6 mg/dL — ABNORMAL LOW (ref 0.61–1.24)
GFR, Estimated: >60 mL/min (ref >60)
Glucose, Bld: 228 mg/dL — ABNORMAL HIGH (ref 70–99)
Potassium: 3 mmol/L — ABNORMAL LOW (ref 3.5–5.1)
Sodium: 141 mmol/L (ref 135–145)

## 2021-04-06 MED ORDER — POTASSIUM CHLORIDE CRYS ER 20 MEQ PO TBCR
40.0000 meq | EXTENDED_RELEASE_TABLET | ORAL | Status: AC
  Administered 2021-04-06 (×2): 40 meq via ORAL
  Filled 2021-04-06 (×2): qty 2

## 2021-04-06 NOTE — Progress Notes (Signed)
Progress Note  Patient Name: Allen Russell Date of Encounter: 04/06/2021 PCP:  Steve Rattler, PA-C Cardiologist: New to [facility]  Subjective   Mr. Bloom  is ambulating well, he's had no issues   Inpatient Medications    Scheduled Meds:  (feeding supplement) PROSource Plus  30 mL Oral BID BM   ALPRAZolam  0.25 mg Oral Once   Chlorhexidine Gluconate Cloth  6 each Topical Daily   enoxaparin (LOVENOX) injection  40 mg Subcutaneous Q24H   feeding supplement (GLUCERNA SHAKE)  237 mL Oral BID BM   folic acid  1 mg Oral Daily   gabapentin  300 mg Oral BID   hydrocortisone  50 mg Oral TID   insulin aspart  0-15 Units Subcutaneous TID WC   insulin aspart  0-5 Units Subcutaneous QHS   insulin aspart  3 Units Subcutaneous TID WC   insulin glargine-yfgn  15 Units Subcutaneous BID   lipase/protease/amylase  36,000 Units Oral TID WC   multivitamin with minerals  1 tablet Oral Daily   potassium chloride  40 mEq Oral Q4H   sodium chloride flush  10-40 mL Intracatheter Q12H   thiamine  100 mg Oral Daily   Continuous Infusions:  sodium chloride 125 mL/hr at 04/05/21 0742   PRN Meds: acetaminophen, cyclobenzaprine, dextrose, docusate sodium, guaiFENesin-dextromethorphan, lipase/protease/amylase, melatonin, ondansetron, polyethylene glycol, sodium chloride flush, traMADol   Vital Signs    Vitals:   04/06/21 0550 04/06/21 0552 04/06/21 1053 04/06/21 1342  BP: (!) 95/57  (!) 107/59 98/64  Pulse: (!) 55   64  Resp: 18   16  Temp: 98.3 F (36.8 C)   98.6 F (37 C)  TempSrc: Oral   Oral  SpO2: 94%   100%  Weight:  101.6 kg    Height:        Intake/Output Summary (Last 24 hours) at 04/06/2021 1515 Last data filed at 04/06/2021 1200 Gross per 24 hour  Intake 1100 ml  Output --  Net 1100 ml   Last 3 Weights 04/06/2021 04/05/2021 04/04/2021  Weight (lbs) 223 lb 15.8 oz 208 lb 8.9 oz 213 lb 3 oz  Weight (kg) 101.6 kg 94.6 kg 96.7 kg      Telemetry    Sinus rhythm HR 50s.  Personally Reviewed  ECG    03/25/2021 Sinus bradycardia HR 45 bpm, Qtc 368 ms, no ST-T changes - Personally Reviewed  Physical Exam   GEN: No acute distress.  Sitting up Neck: No JVD Cardiac: RRR, no murmurs, rubs, or gallops.  VASC: radial pulses 2+ BL, DP 2+ BL Respiratory: Clear to auscultation bilaterally. GI: Soft, nontender, non-distended  MS: No edema; No deformity. Neuro:  Nonfocal  Skin: warm and well perfused Psych: Normal affect   Labs    High Sensitivity Troponin:   Recent Labs  Lab 03/25/21 0525 03/25/21 0750  TROPONINIHS 5 4     Chemistry Recent Labs  Lab 04/04/21 0507 04/05/21 0249 04/06/21 0407  NA 138 141 141  K 3.2* 3.3* 3.0*  CL 105 110 109  CO2 27 27 27   GLUCOSE 205* 271* 228*  BUN 24* 20 17  CREATININE 0.68 0.71 0.60*  CALCIUM 7.8* 7.9* 8.0*  MG 1.9  --   --   PROT  --  4.6*  --   ALBUMIN  --  2.7*  --   AST  --  20  --   ALT  --  27  --   ALKPHOS  --  42  --  BILITOT  --  0.7  --   GFRNONAA >60 >60 >60  ANIONGAP 6 4* 5    Lipids No results for input(s): CHOL, TRIG, HDL, LABVLDL, LDLCALC, CHOLHDL in the last 168 hours.  Hematology Recent Labs  Lab 03/31/21 0840 04/03/21 0458 04/04/21 0507  WBC 5.6 2.6* 2.4*  RBC 3.34* 2.83* 2.91*  HGB 10.6* 9.1* 9.5*  HCT 33.3* 29.1* 29.4*  MCV 99.7 102.8* 101.0*  MCH 31.7 32.2 32.6  MCHC 31.8 31.3 32.3  RDW 17.9* 17.6* 17.2*  PLT 254 152 152   Thyroid  Recent Labs  Lab 03/31/21 0840  FREET4 0.94    BNPNo results for input(s): BNP, PROBNP in the last 168 hours.  DDimer No results for input(s): DDIMER in the last 168 hours.   Radiology    CT Chest: LAD coronary artery calcification  Cardiac Studies   TTE 03/23/2021  1. Left ventricular ejection fraction, by estimation, is 50 to 55%. The  left ventricle has low normal function. The left ventricle has no regional  wall motion abnormalities. Left ventricular diastolic parameters were  normal.   2. Right ventricular systolic  function is mildly reduced. The right  ventricular size is moderately enlarged.   3. The mitral valve is normal in structure. Trivial mitral valve  regurgitation. No evidence of mitral stenosis.   4. The aortic valve is grossly normal. There is mild calcification of the  aortic valve. Aortic valve regurgitation is not visualized. No aortic  stenosis is present.   5. Aortic dilatation noted. There is mild dilatation of the aortic root,  measuring 42 mm.   Tilt Table Test 11/20/2003 Dr. Angelia Mould not able to obtain  Patient Profile     60 y.o. 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 Low concern for ischemic cause with no angina (CT does show some diffuse CAC in LAD/not quantified), normal EF, no RWMA. He had negative blood cultures. DKA is resolved. He has poorly controlled DMII with A1c 11.4 which is concerning for possible etiology of dysautonomia and ETOH. 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 Ladona Ridgel). He's had some mild symptoms since then. Will plan to explore additional etiologies of hypotension related to autonomic neuropathy. Can consider referral to [facility] to the [facility] center as an outpatient. He does not have a primary cardiologist  He can continue midodrine and florinef for possible autonomic neuropathy upon discharge. Otherwise, his fluids will stop today. Would encourage oral hydration. If Bps ok  overnight, do not see a reason to keep him here from a cardiac perspective. He can follow-up with [place] in clinic with either myself, Dr. Tenny Craw or Dr. Cristal Deer. Would consider having him see endocrinology as well. --> ok to discharge from cardiology perspective --> ok to trial off midodrine and florinef and continue to monitor BP -> continued on hydrocortisone 50 mg TID with low cortisol  --> increase fluid/ salt intake (mindful of solute intake with ?DI) --> compression stockings --> we will sign off, we are arranging  follow up in cardiology clinic in 4-6 weeks   For questions or updates, please contact [facility] Please consult [email or web address] for contact info under        Signed, Maisie Fus, MD , MS 04/06/2021, 3:15 PM

## 2021-04-06 NOTE — Progress Notes (Signed)
PROGRESS NOTE    Allen Russell  PXT:[id number] DOB: 1961/02/12 DOA: 03/21/2021 PCP: Steve Rattler, PA-C    Brief Narrative:  [name] was admitted to the hospital with the working diagnosis of diabetes ketoacidosis, complicated with undifferentiated shock, adrenal insufficiency, dysautonomia.    60 year old male with past medical history for type 2 diabetes mellitus, peripheral neuropathy, alcohol abuse, alcoholic pancreatitis, status post surgical necrosectomy and cystogastrostomy in 2012.  L2-L3 posterior spinal fusion who presented with weight loss, weakness and dyspnea and exertion. 50 pound weight loss over the prior 2-3 months prior to hospitalization.  Very poor oral intake. On his initial physical examination he was afebrile, heart rate 75, blood pressure 100/79, oxygen saturation 100%, respiratory rate 20, his lungs were clear to auscultation, he was tachypneic, heart S1-S2, present, rhythmic, abdomen soft nontender, no lower extremity edema.   His arterial pH was 7.19, PCO2 less than 19, PO2 75, bicarb 4.3. Sodium 135, potassium 3.2, chloride 106, bicarb 9, glucose 363, BUN 24, creatinine 1.35.  Anion gap 20. White count 5.0, hemoglobin 14.7, hematocrit 41.8, platelets 202. SARS COVID-19 negative.   Urinalysis specific gravity 1.025, 30 protein, > 500 glucose.   Chest radiograph no infiltrates.   CT chest/ abdomen and pelvis  with no signs of malignancy.  Trace bilateral pleural effusions.  Sequela of chronic calcific pancreatitis.  Anterior pancreatic body pseudocyst.  Proximal gastric underdistention.  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   EKG 59 bpm, normal axis, interventricular conduction delay, normal QTC, sinus rhythm, low voltage, no significant ST segment or T wave changes.   Patient was placed on insulin infusion along with volume repletion. His ketoacidosis resolved but he remained  hypotensive. He was placed on vasopressors to maintain hemodynamic stability.   He had a prolonged weaning of vasopressors, he was placed on stress dose steroids for suspected adrenal insufficiency. Added midodrine for autonomic dysautonomia. Cardiology was consulted.   Patient transferred to [facility] 10/4.  Blood pressure has been stable with systolic 90 to 100.    Assessment & Plan:   Active Problems:   Ketoacidosis   Protein-calorie malnutrition, severe   Shock (HCC)   Dysautonomia (HCC)   DKA, T2DM. His ketoacidosis has resolved.  His glucose this am fasting was 228. Capillary has been 209 and 120.   Continue glucose control with basal insulin 15 units bid and insulin sliding scale.  Pre-meal 3 units.    2. Hypotension, adrenal insufficiency/ dysautonomia.  Echocardiogram with preserved LV systolic function, RV with moderate enlargement cavity, with mild reduction in systolic function.    His MAP has been above 60 mmHg. Marland Kitchen Continue with hydrocortisone 50 mg tid Will trial patient off fludrocortisone and midodrine Continue blood pressure monitoring.  Plan for dc home with steroids and endocrinology follow up.    3. Alcohol abuse. No signs of acute withdrawal.    4. Severe protein calorie malnutrition/ chronic pancreatic insufficiency.  Nutritional supplementation.  Continue with pancreatic enzymes.    5. Chronic anemia.  Hgb 9,5 hct 29.4    6. AKI, hypokalemia  Continue electrolyte correction with Kcl 80 meq in 2 divide doses and follow up renal function in am.  Serum cr is 0.60 and bicarbonate at 27.      Status is: Inpatient  Remains inpatient appropriate because:Inpatient level of care appropriate due to severity of illness  Dispo: The patient is from: Home  Anticipated d/c is to: Home              Patient currently is not medically stable to d/c.   Difficult to place patient No  DVT prophylaxis: Enoxaparin   Code Status:    full  Family  Communication:   No family at the bedside      Nutrition Status: Nutrition Problem: Severe Malnutrition Etiology: social / environmental circumstances Signs/Symptoms: severe fat depletion, severe muscle depletion Interventions: Glucerna shake, Prostat, MVI     Subjective: Patient with no nausea or vomiting, no chest pain. He has been ambulating in the hallway   Objective: Vitals:   04/06/21 0550 04/06/21 0552 04/06/21 1053 04/06/21 1342  BP: (!) 95/57  (!) 107/59 98/64  Pulse: (!) 55   64  Resp: 18   16  Temp: 98.3 F (36.8 C)   98.6 F (37 C)  TempSrc: Oral   Oral  SpO2: 94%   100%  Weight:  101.6 kg    Height:        Intake/Output Summary (Last 24 hours) at 04/06/2021 1459 Last data filed at 04/06/2021 1200 Gross per 24 hour  Intake 1100 ml  Output --  Net 1100 ml   Filed Weights   04/04/21 0500 04/05/21 0500 04/06/21 0552  Weight: 96.7 kg 94.6 kg 101.6 kg    Examination:   General: Not in pain or dyspnea,.  Neurology: Awake and alert, non focal  E ENT: no pallor, no icterus, oral mucosa moist Cardiovascular: No JVD. S1-S2 present, rhythmic, no gallops, rubs, or murmurs. No lower extremity edema. Pulmonary:  positive breath sounds bilaterally, adequate air movement, no wheezing, rhonchi or rales. Gastrointestinal. Abdomen soft and non tender Skin. No rashes Musculoskeletal: no joint deformities     Data Reviewed: I have personally reviewed following labs and imaging studies  CBC: Recent Labs  Lab 03/31/21 0840 04/03/21 0458 04/04/21 0507  WBC 5.6 2.6* 2.4*  HGB 10.6* 9.1* 9.5*  HCT 33.3* 29.1* 29.4*  MCV 99.7 102.8* 101.0*  PLT 254 152 152   Basic Metabolic Panel: Recent Labs  Lab 04/03/21 0458 04/04/21 0507 04/05/21 0249 04/06/21 0407  NA 144 138 141 141  K 3.2* 3.2* 3.3* 3.0*  CL 115* 105 110 109  CO2 25 27 27 27   GLUCOSE 159* 205* 271* 228*  BUN 23* 24* 20 17  CREATININE 0.58* 0.68 0.71 0.60*  CALCIUM 7.9* 7.8* 7.9* 8.0*  MG  --   1.9  --   --   PHOS  --  4.2  --   --    GFR: Estimated Creatinine Clearance: 125.1 mL/min (A) (by C-G formula based on SCr of 0.6 mg/dL (L)). Liver Function Tests: Recent Labs  Lab 04/05/21 0249  AST 20  ALT 27  ALKPHOS 42  BILITOT 0.7  PROT 4.6*  ALBUMIN 2.7*   No results for input(s): LIPASE, AMYLASE in the last 168 hours. No results for input(s): AMMONIA in the last 168 hours. Coagulation Profile: No results for input(s): INR, PROTIME in the last 168 hours. Cardiac Enzymes: No results for input(s): CKTOTAL, CKMB, CKMBINDEX, TROPONINI in the last 168 hours. BNP (last 3 results) No results for input(s): PROBNP in the last 8760 hours. HbA1C: No results for input(s): HGBA1C in the last 72 hours. CBG: Recent Labs  Lab 04/05/21 1143 04/05/21 1621 04/05/21 2139 04/06/21 0746 04/06/21 1146  GLUCAP 184* 322* 215* 209* 120*   Lipid Profile: No results for input(s): CHOL, HDL, LDLCALC, TRIG, CHOLHDL, LDLDIRECT  in the last 72 hours. Thyroid Function Tests: No results for input(s): TSH, T4TOTAL, FREET4, T3FREE, THYROIDAB in the last 72 hours. Anemia Panel: No results for input(s): VITAMINB12, FOLATE, FERRITIN, TIBC, IRON, RETICCTPCT in the last 72 hours.    Radiology Studies: I have reviewed all of the imaging during this hospital visit personally     Scheduled Meds:  (feeding supplement) PROSource Plus  30 mL Oral BID BM   ALPRAZolam  0.25 mg Oral Once   Chlorhexidine Gluconate Cloth  6 each Topical Daily   enoxaparin (LOVENOX) injection  40 mg Subcutaneous Q24H   feeding supplement (GLUCERNA SHAKE)  237 mL Oral BID BM   fludrocortisone  0.1 mg Oral BID   folic acid  1 mg Oral Daily   gabapentin  300 mg Oral BID   hydrocortisone  50 mg Oral TID   insulin aspart  0-15 Units Subcutaneous TID WC   insulin aspart  0-5 Units Subcutaneous QHS   insulin aspart  3 Units Subcutaneous TID WC   insulin glargine-yfgn  15 Units Subcutaneous BID   lipase/protease/amylase   36,000 Units Oral TID WC   midodrine  10 mg Oral TID WC   multivitamin with minerals  1 tablet Oral Daily   sodium chloride flush  10-40 mL Intracatheter Q12H   thiamine  100 mg Oral Daily   Continuous Infusions:  sodium chloride 125 mL/hr at 04/05/21 0742     LOS: 16 days        Mieke Brinley Annett Gula, MD

## 2021-04-07 ENCOUNTER — Other Ambulatory Visit: Payer: Self-pay

## 2021-04-07 LAB — GLUCOSE, CAPILLARY
Glucose-Capillary: 184 mg/dL — ABNORMAL HIGH (ref 70–99)
Glucose-Capillary: 214 mg/dL — ABNORMAL HIGH (ref 70–99)

## 2021-04-07 LAB — BASIC METABOLIC PANEL
Anion gap: 4 — ABNORMAL LOW (ref 5–15)
BUN: 16 mg/dL (ref 6–20)
CO2: 31 mmol/L (ref 22–32)
Calcium: 8.1 mg/dL — ABNORMAL LOW (ref 8.9–10.3)
Chloride: 109 mmol/L (ref 98–111)
Creatinine, Ser: 0.65 mg/dL (ref 0.61–1.24)
GFR, Estimated: >60 mL/min (ref >60)
Glucose, Bld: 233 mg/dL — ABNORMAL HIGH (ref 70–99)
Potassium: 2.9 mmol/L — ABNORMAL LOW (ref 3.5–5.1)
Sodium: 144 mmol/L (ref 135–145)

## 2021-04-07 LAB — MAGNESIUM: Magnesium: 1.8 mg/dL (ref 1.7–2.4)

## 2021-04-07 MED ORDER — PANCRELIPASE (LIP-PROT-AMYL) 36000-114000 UNITS PO CPEP
36000.0000 [IU] | ORAL_CAPSULE | Freq: Three times a day (TID) | ORAL | 0 refills | Status: DC
  Filled 2021-04-07: qty 100, 30d supply, fill #0

## 2021-04-07 MED ORDER — GABAPENTIN 300 MG PO CAPS
300.0000 mg | ORAL_CAPSULE | Freq: Two times a day (BID) | ORAL | 0 refills | Status: DC
  Filled 2021-04-07: qty 60, 30d supply, fill #0

## 2021-04-07 MED ORDER — HYDROCORTISONE 10 MG PO TABS: ORAL_TABLET | ORAL | 0 refills | Status: DC

## 2021-04-07 MED ORDER — BLOOD GLUCOSE METER KIT: PACK | 0 refills | Status: DC

## 2021-04-07 MED ORDER — GABAPENTIN 300 MG PO CAPS: 300.0000 mg | ORAL_CAPSULE | Freq: Two times a day (BID) | ORAL | 0 refills | Status: DC

## 2021-04-07 MED ORDER — POTASSIUM CHLORIDE CRYS ER 20 MEQ PO TBCR
40.0000 meq | EXTENDED_RELEASE_TABLET | ORAL | Status: AC
Start: 2021-04-07 — End: 2021-04-07
  Administered 2021-04-07 (×2): 40 meq via ORAL
  Filled 2021-04-07 (×2): qty 2

## 2021-04-07 MED ORDER — CYCLOBENZAPRINE HCL 10 MG PO TABS
10.0000 mg | ORAL_TABLET | Freq: Two times a day (BID) | ORAL | 0 refills | Status: DC | PRN
  Filled 2021-04-07: qty 15, 8d supply, fill #0

## 2021-04-07 MED ORDER — TRUE METRIX METER W/DEVICE KIT
PACK | 0 refills | Status: AC
  Filled 2021-04-07: qty 1, 30d supply, fill #0

## 2021-04-07 MED ORDER — METFORMIN HCL ER 500 MG PO TB24: 500.0000 mg | ORAL_TABLET | Freq: Every day | ORAL | 0 refills | Status: DC

## 2021-04-07 MED ORDER — GLUCERNA SHAKE PO LIQD
237.0000 mL | Freq: Two times a day (BID) | ORAL | 0 refills | Status: AC
  Filled 2021-04-07: qty 14220, 30d supply, fill #0

## 2021-04-07 MED ORDER — MAGNESIUM SULFATE 2 GM/50ML IV SOLN
2.0000 g | Freq: Once | INTRAVENOUS | Status: AC
  Administered 2021-04-07: 2 g via INTRAVENOUS
  Filled 2021-04-07: qty 50

## 2021-04-07 MED ORDER — INSULIN ASPART PROT & ASPART (70-30 MIX) 100 UNIT/ML PEN: PEN_INJECTOR | SUBCUTANEOUS | 0 refills | Status: DC

## 2021-04-07 MED ORDER — HYDROCORTISONE 10 MG PO TABS
ORAL_TABLET | ORAL | 0 refills | Status: DC
  Filled 2021-04-07: qty 85, 20d supply, fill #0

## 2021-04-07 MED ORDER — PANCRELIPASE (LIP-PROT-AMYL) 36000-114000 UNITS PO CPEP: 36000.0000 [IU] | ORAL_CAPSULE | Freq: Three times a day (TID) | ORAL | 0 refills | Status: DC

## 2021-04-07 MED ORDER — INSULIN ASPART PROT & ASPART (70-30 MIX) 100 UNIT/ML PEN
PEN_INJECTOR | SUBCUTANEOUS | 0 refills | Status: DC
  Filled 2021-04-07: qty 6, 29d supply, fill #0

## 2021-04-07 MED ORDER — GLUCERNA SHAKE PO LIQD: 237.0000 mL | Freq: Two times a day (BID) | ORAL | 0 refills | Status: DC

## 2021-04-07 MED ORDER — CYCLOBENZAPRINE HCL 10 MG PO TABS: 10.0000 mg | ORAL_TABLET | Freq: Two times a day (BID) | ORAL | 0 refills | Status: DC | PRN

## 2021-04-07 NOTE — Progress Notes (Signed)
Nutrition Follow-up  DOCUMENTATION CODES:   Severe malnutrition in context of chronic illness  INTERVENTION:   - Continue Glucerna Shake po BID, each supplement provides 220 kcal and 10 grams of protein  - Continue 30 ml ProSource Plus BID, each supplement provides 100 kcals and 15 grams protein.   - Encourage good PO intake  NUTRITION DIAGNOSIS:   Severe Malnutrition related to social / environmental circumstances as evidenced by severe fat depletion, severe muscle depletion. - Ongoing  GOAL:   Patient will meet greater than or equal to 90% of their needs - Progressing  MONITOR:   PO intake, Weight trends, Labs, I & O's  REASON FOR ASSESSMENT:   Malnutrition Screening Tool    ASSESSMENT:   60 y.o. 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 EGD 03/29/18 with removal of Axios stent), and L2-L3 posterior spinal fusion surgery. He presented to the ED due to weight loss, weakness, and DOE. He was found to be in DKA and started on insulin drip and IV fluids.  9/21: ECHO 10/4: Transferred out of ICU  Pt reports that his appetite is back to normal that he is eating well. Stated that he is drinking the Glucerna shakes and mixing the ProSource Plus with diet ginger ale to drink.    Per EMR, pt intake includes: 10/1: Breakfast 100%, Lunch 100% 10/2: Dinner 100% 10/4: Lunch 100%, Dinner 100% 10/5: Breakfast 100%, Lunch 100%  Per pt he is being d/c today. Stated no other nutrition related concerns or questions at this time.  Medications reviewed and include: SSI 0-15 units & 3 units w/ meals, 0-5 units daily, & 15 units BID, Folic acid, MVI, Potassium Chloride, Creon, Thiamine Labs reviewed:  Potassium 2.9, 24 hr BG trends 120-238 mg/dL  Admission Weight: 16.1 kg Current Weight 103.1 kg  Diet Order:   Diet Order             Diet Carb Modified Fluid consistency: Thin; Room service appropriate? Yes  Diet effective now                   EDUCATION NEEDS:   No education needs have been identified at this time  Skin:  Skin Assessment: Reviewed RN Assessment  Last BM:  04/06/2021 - Type 4  Height:   Ht Readings from Last 1 Encounters:  03/21/21 6\' 4"  (1.93 m)    Weight:   Wt Readings from Last 1 Encounters:  04/07/21 103.1 kg    Ideal Body Weight:  91.8 kg  BMI:  Body mass index is 27.67 kg/m.  Estimated Nutritional Needs:   Kcal:  2500-2750 kcal  Protein:  125-140 grams  Fluid:  >/= 2.8 L/day    06/07/21 BS, PLDN Clinical Dietitian See [facility] for contact information.

## 2021-04-07 NOTE — Plan of Care (Signed)
  Problem: Education: Goal: Individualized Educational Video(s) Outcome: Completed/Met   Problem: Cardiac: Goal: Ability to maintain an adequate cardiac output will improve Outcome: Completed/Met   Problem: Health Behavior/Discharge Planning: Goal: Ability to identify and utilize available resources and services will improve Outcome: Completed/Met Goal: Ability to manage health-related needs will improve Outcome: Completed/Met   Problem: Fluid Volume: Goal: Ability to achieve a balanced intake and output will improve Outcome: Completed/Met   Problem: Metabolic: Goal: Ability to maintain appropriate glucose levels will improve Outcome: Completed/Met   Problem: Nutritional: Goal: Maintenance of adequate nutrition will improve Outcome: Completed/Met Goal: Maintenance of adequate weight for body size and type will improve Outcome: Completed/Met   Problem: Respiratory: Goal: Will regain and/or maintain adequate ventilation Outcome: Completed/Met   Problem: Urinary Elimination: Goal: Ability to achieve and maintain adequate renal perfusion and functioning will improve Outcome: Completed/Met   Problem: Education: Goal: Knowledge of General Education information will improve Description: Including pain rating scale, medication(s)/side effects and non-pharmacologic comfort measures Outcome: Completed/Met   Problem: Health Behavior/Discharge Planning: Goal: Ability to manage health-related needs will improve Outcome: Completed/Met   Problem: Clinical Measurements: Goal: Ability to maintain clinical measurements within normal limits will improve Outcome: Completed/Met Goal: Will remain free from infection Outcome: Completed/Met Goal: Diagnostic test results will improve Outcome: Completed/Met Goal: Respiratory complications will improve Outcome: Completed/Met Goal: Cardiovascular complication will be avoided Outcome: Completed/Met   Problem: Activity: Goal: Risk for  activity intolerance will decrease Outcome: Completed/Met   Problem: Nutrition: Goal: Adequate nutrition will be maintained Outcome: Completed/Met   Problem: Coping: Goal: Level of anxiety will decrease Outcome: Completed/Met   Problem: Elimination: Goal: Will not experience complications related to bowel motility Outcome: Completed/Met Goal: Will not experience complications related to urinary retention Outcome: Completed/Met   Problem: Pain Managment: Goal: General experience of comfort will improve Outcome: Completed/Met   Problem: Safety: Goal: Ability to remain free from injury will improve Outcome: Completed/Met   Problem: Skin Integrity: Goal: Risk for impaired skin integrity will decrease Outcome: Completed/Met

## 2021-04-07 NOTE — Care Management (Signed)
Spoke to patient/sister Allen Russell prior d/c about d/c plans-MATCH program letter $3 co pay/each med-[facility] for pick up of meds-agreed. Letter @ nsg station for sister Allen Russell to pick up.Nsg aware.No further CM needs.

## 2021-04-07 NOTE — Progress Notes (Signed)
Inpatient Diabetes Program Recommendations  AACE/ADA: New Consensus Statement on Inpatient Glycemic Control (2015)  Target Ranges:  Prepandial:   less than 140 mg/dL      Peak postprandial:   less than 180 mg/dL (1-2 hours)      Critically ill patients:  140 - 180 mg/dL   Lab Results  Component Value Date   GLUCAP 184 (H) 04/07/2021   HGBA1C 11.4 (H) 03/21/2021    Review of Glycemic Control  Diabetes history: DM 2 Outpatient Diabetes medications: Metformin XR 500 mg bid Current orders for Inpatient glycemic control:  Semglee 15 units bid Novolog 0-15 units tid + hs Novolog 3 units tid meal coverage  Glucerna bid between meals  Pt reports he now has Medicaid  Spoke with pt regarding A1c of 11.4% on 9/19, up from A1c controlled on Metformin 500 XR daily back in April this year per pt report. Discussed glucose control esp while taking steroids. Discussed when and how to take insulin. Pt called sister while I was in the room. She got pt approved for Medicaid and has the insurance card information and will send pt a picture with the information.   Discussed when to check glucose, hypoglycemia s/s and treatment. Pt gave himself insulin injection while I was int he room with a syringe.  D/c: Novolog mix Flexpen order# [id number] 70/30 22 units twice a day before meals x5 Days 70/30 12 units twice a day before meals x5 Days 70/30 6 units twice a day before meals x5 Days  Insulin pen needles order #[id number] Glucose meter kit order # [id number] Needs Metformin XR refill 500 mg bid  Thanks,  Tama Headings RN, MSN, BC-ADM Inpatient Diabetes Coordinator Team Pager [phone] (8a-5p)

## 2021-04-07 NOTE — Discharge Summary (Addendum)
Physician Discharge Summary  Allen Russell XFG:[id number] DOB: 02/24/1961 DOA: 03/21/2021  PCP: Zenaida Niece, PA-C  Admit date: 03/21/2021 Discharge date: 04/07/2021  Admitted From: home  Disposition:   home   Recommendations for Outpatient Follow-up and new medication changes:  Follow up with Primary Care in 7 to 10 days.  Patient will continue with insulin therapy with insulin 70/30, reduce the dose along steroid taper. Taper steroids.  Follow up electrolytes as outpatient tomorrow with [facility] [place] to follow up on potassium.     Home Health: na   Equipment/Devices: na    Discharge Condition: stable  CODE STATUS: full  Diet recommendation:  diabetic prudent.   Brief/Interim Summary: Allen Russell was admitted to the hospital with the working diagnosis of diabetes ketoacidosis, complicated with undifferentiated shock, adrenal insufficiency, dysautonomia.    60 year old male with past medical history for type 2 diabetes mellitus, peripheral neuropathy, alcohol abuse, alcoholic pancreatitis, status post surgical necrosectomy and cystogastrostomy in 2012.  L2-L3 posterior spinal fusion who presented with weight loss, weakness and dyspnea and exertion. 50 pound weight loss over the prior 2-3 months prior to hospitalization.  Very poor oral intake. On his initial physical examination he was afebrile, heart rate 75, blood pressure 100/79, oxygen saturation 100%, respiratory rate 20, his lungs were clear to auscultation, he was tachypneic, heart S1-S2, present, rhythmic, abdomen soft nontender, no lower extremity edema.   His arterial pH was 7.19, PCO2 less than 19, PO2 75, bicarb 4.3. Sodium 135, potassium 3.2, chloride 106, bicarb 9, glucose 363, BUN 24, creatinine 1.35.  Anion gap 20. White count 5.0, hemoglobin 14.7, hematocrit 41.8, platelets 202. SARS COVID-19 negative.   Urinalysis specific gravity 1.025, 30 protein, > 500 glucose.   Chest radiograph no  infiltrates.   CT chest/ abdomen and pelvis  with no signs of malignancy.  Trace bilateral pleural effusions.  Sequela of chronic calcific pancreatitis.  Anterior pancreatic body pseudocyst.  Proximal gastric underdistention.  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   EKG 59 bpm, normal axis, interventricular conduction delay, normal QTC, sinus rhythm, low voltage, no significant ST segment or T wave changes.   Patient was placed on insulin infusion along with volume repletion. His ketoacidosis resolved but he remained hypotensive. He was placed on vasopressors to maintain hemodynamic stability.   He had a prolonged weaning of vasopressors, he was placed on stress dose steroids for suspected adrenal insufficiency. Added midodrine for autonomic dysautonomia. Cardiology was consulted.   Patient transferred to [facility] 10/4.   Blood pressure has been stable with systolic 90 to 967. Discontinue fludrocortisone and midodrine with good toleration. Continue slow taper steroids and follow up as outpatient.   T2DM with Diabetic ketoacidosis. Patient was admitted to the intensive care unit. He responded well to IV insulin infusion with resolution of acidosis. Patient was transitioned to basal insulin and sliding scale with good toleration. Patient will be discharged on insulin therapy and follow up as outpatient. Continue holding metformin for now.   2. Hypotension, adrenal insufficiency, dysautonomia. Patient required vasopressors. Further work up with echocardiogram showed preserved LV systolic function with moderate RV enlargement with mild reduction in systolic function.   His basal cortisol was found very low down to 9.3, stim test up to 23 and 26, 30 minutes and 60 minutes respectively.  He was placed on midodrine, stress dose steroids and fludrocortisone.  Now only in hydrocortisone with stable blood pressure.  Patient with no  dizziness or lightheadedness.  Plan to continue slow taper of steroids as outpatient.    3. Alcohol abuse. Patient had frequent neuro checks, no current withdrawal symptoms.   4. Severe calorie protein malnutrition, chronic pancreatic insufficiency  Patient was placed on nutritional supplements and pancreatic enzymes with good toleration. Will need outpatient follow up.   5. Chronic anemia. His cell count remained stable.   6. AKI with hypokalemia and hypomagnesemia. Patient had IV fluids and vasopressors to maintain hemodynamic stability.  Renal function improved and electrolytes were corrected.  Patient received DDAVP once on 09.23 for suspected diabetes insipidus.  Her Na was remained stable, at discharge is 144, at this point rule out diabetes insipidus.   Patient received 80 meq Kcl before he left the hospital for a K of 2,9.   Discharge Diagnoses:  Active Problems:   Ketoacidosis   Protein-calorie malnutrition, severe   Shock ([place])   Dysautonomia ([place])    Discharge Instructions   Allergies as of 04/07/2021       Reactions   Morphine Other (See Comments)   headache        Medication List     STOP taking these medications    gabapentin 600 MG tablet Commonly known as: NEURONTIN Replaced by: gabapentin 300 MG capsule   traMADol 50 MG tablet Commonly known as: ULTRAM       TAKE these medications    blood glucose meter kit and supplies Dispense based on patient and insurance preference. Use up to four times daily as directed. (FOR ICD-10 E10.9, E11.9).   cyclobenzaprine 10 MG tablet Commonly known as: FLEXERIL Take 1 tablet (10 mg total) by mouth every 12 (twelve) hours as needed for muscle spasms. What changed:  when to take this reasons to take this   diphenhydrAMINE 25 MG tablet Commonly known as: BENADRYL Take 25 mg by mouth every 6 (six) hours as needed for allergies.   feeding supplement (GLUCERNA SHAKE) Liqd Take 237 mLs by mouth 2 (two)  times daily between meals.   gabapentin 300 MG capsule Commonly known as: NEURONTIN Take 1 capsule (300 mg total) by mouth 2 (two) times daily. Replaces: gabapentin 600 MG tablet   hydrocortisone 10 MG tablet Commonly known as: CORTEF Take 5 tablets twice daily for 5 days, then 2 tablets twice daily for 5 days, then 2 tablets daily for 5 days, then 1 tablet daily for 5 days.   insulin aspart protamine - aspart (70-30) 100 UNIT/ML FlexPen Commonly known as: NOVOLOG 70/30 MIX 22 units twice daily before meals for 5 days, then 12 units twice daily before meals for 5 days, then continue with 6 units twice daily before meals.   lipase/protease/amylase 36000 UNITS Cpep capsule Commonly known as: CREON Take 1 capsule (36,000 Units total) by mouth 3 (three) times daily with meals.   metFORMIN 500 MG 24 hr tablet Commonly known as: GLUCOPHAGE-XR Take 1 tablet (500 mg total) by mouth daily with breakfast. What changed: how much to take   multivitamin tablet Take 1 tablet by mouth daily.   ranitidine 150 MG tablet Commonly known as: ZANTAC Take 150 mg by mouth daily as needed for heartburn.        Follow-up Information     [facility] Follow up.   Why: Contact the clinic to schedule a Emergency Department follow up appointment. Information has been forwarded to the scheduler as well. If you do not rececive a call within 2-3 days,please contact them at  the number above. Contact information: [address] [address] [phone]               Allergies  Allergen Reactions   Morphine Other (See Comments)    headache      Consultations: Cardiology    Procedures/Studies: CT CHEST W CONTRAST  Result Date: 03/24/2021 CLINICAL DATA:  Cancer of unknown primary. Surveillance. History of pancreatic pseudocyst. Unintentional weight loss and abdominal pain. EXAM: CT CHEST, ABDOMEN, AND PELVIS WITH CONTRAST TECHNIQUE:  Multidetector CT imaging of the chest, abdomen and pelvis was performed following the standard protocol during bolus administration of intravenous contrast. CONTRAST:  5mL OMNIPAQUE IOHEXOL 350 MG/ML SOLN COMPARISON:  Chest radiograph 03/24/2021. Abdominopelvic CT 05/14/2018 from [facility]. No report available. Report of chest abdomen and pelvic CTs of 04/03/2011. FINDINGS: CT CHEST FINDINGS Cardiovascular: Left PICC line tip low SVC. Bovine arch. Aortic atherosclerosis. Mild cardiomegaly, without pericardial effusion. Lad coronary artery calcification. Mediastinum/Nodes: No supraclavicular adenopathy. No mediastinal or hilar adenopathy. Lungs/Pleura: Trace bilateral pleural fluid.  Clear lungs. Musculoskeletal: No acute osseous abnormality. Vertebral hemangioma within the T11 vertebral body CT ABDOMEN PELVIS FINDINGS Hepatobiliary: Scattered too small to characterize liver lesions are present in 2019 and presumed tiny cysts. Cholecystectomy, without biliary ductal dilatation. Pancreas: The pancreatic head and uncinate process demonstrate diffuse parenchymal calcifications indicative of chronic calcific pancreatitis. Increased since 05/14/2018. The pancreatic tail and upstream body are either markedly atrophic or surgically absent. Low-density collection off the ventral pancreatic body measures 4.8 x 3.8 cm on 57/2, and is new since 05/14/2018. No peripancreatic edema. Spleen: Normal in size, without focal abnormality. Adrenals/Urinary Tract: Normal adrenal glands. Normal kidneys, without hydronephrosis. Normal urinary bladder. Stomach/Bowel: The gastric body is displaced anteriorly by the lesser sac collection. Proximal gastric underdistention with apparent soft tissue fullness including on 48/2. The gastric antrum is underdistended. Colonic stool burden suggests constipation. Normal terminal ileum. Normal small bowel. Vascular/Lymphatic: Aortic atherosclerosis. Splenic vein is absent, presumably  chronically thrombosed. Gastroepiploic collaterals are secondary. Extensive collateral veins identified within the jejunal mesentery, including on 61/2. No abdominopelvic adenopathy. Reproductive: Normal prostate. Other: Trace cul-de-sac fluid.  No free intraperitoneal air. Musculoskeletal: L3-4 trans pedicle screw fixation. IMPRESSION: 1. No evidence of primary malignancy within the chest, abdomen, or pelvis. 2. Trace bilateral pleural effusions with small volume ascites. Question fluid overload. 3. Sequelae of chronic calcific pancreatitis within the head and uncinate process. Low-density collection about the anterior pancreatic body is most likely a pseudocyst, new since 05/14/2018. Consider CT or MRI follow-up at 3-6 months. 4. Proximal gastric underdistention. Apparent soft tissue fullness is likely secondary. Correlate with any symptoms to suggest gastritis or other gastric pathology. 5. 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 6. Coronary artery atherosclerosis. Aortic Atherosclerosis (ICD10-I70.0). Electronically Signed   By: Abigail Miyamoto M.D.   On: 03/24/2021 14:50   MR BRAIN W WO CONTRAST  Result Date: 03/25/2021 CLINICAL DATA:  Headache, new or worsening (Age >= 50y) New onset diabetes insipudus. Ongoing headaches, unknown cause EXAM: MRI HEAD WITHOUT AND WITH CONTRAST TECHNIQUE: Multiplanar, multiecho pulse sequences of the brain and surrounding structures were obtained without and with intravenous contrast. CONTRAST:  55mL GADAVIST GADOBUTROL 1 MMOL/ML IV SOLN COMPARISON:  None. FINDINGS: Brain: There is no evidence of acute intracranial hemorrhage, extra-axial fluid collection, or infarct. There is mild parenchymal volume loss. The ventricles are not enlarged. There is no suspicious parenchymal signal abnormality. There is no mass lesion.  There is no abnormal enhancement. The pituitary is unremarkable. Vascular: Normal flow voids. Skull  and upper cervical spine: Normal marrow signal. Sinuses/Orbits: The paranasal sinuses are clear. The globes and orbits are unremarkable. Other: None. IMPRESSION: 1. No acute intracranial pathology. 2. No abnormal mass lesion or abnormal enhancement. Electronically Signed   By: Valetta Mole M.D.   On: 03/25/2021 15:16   CT ABDOMEN PELVIS W CONTRAST  Result Date: 03/24/2021 CLINICAL DATA:  Cancer of unknown primary. Surveillance. History of pancreatic pseudocyst. Unintentional weight loss and abdominal pain. EXAM: CT CHEST, ABDOMEN, AND PELVIS WITH CONTRAST TECHNIQUE: Multidetector CT imaging of the chest, abdomen and pelvis was performed following the standard protocol during bolus administration of intravenous contrast. CONTRAST:  72mL OMNIPAQUE IOHEXOL 350 MG/ML SOLN COMPARISON:  Chest radiograph 03/24/2021. Abdominopelvic CT 05/14/2018 from [facility]. No report available. Report of chest abdomen and pelvic CTs of 04/03/2011. FINDINGS: CT CHEST FINDINGS Cardiovascular: Left PICC line tip low SVC. Bovine arch. Aortic atherosclerosis. Mild cardiomegaly, without pericardial effusion. Lad coronary artery calcification. Mediastinum/Nodes: No supraclavicular adenopathy. No mediastinal or hilar adenopathy. Lungs/Pleura: Trace bilateral pleural fluid.  Clear lungs. Musculoskeletal: No acute osseous abnormality. Vertebral hemangioma within the T11 vertebral body CT ABDOMEN PELVIS FINDINGS Hepatobiliary: Scattered too small to characterize liver lesions are present in 2019 and presumed tiny cysts. Cholecystectomy, without biliary ductal dilatation. Pancreas: The pancreatic head and uncinate process demonstrate diffuse parenchymal calcifications indicative of chronic calcific pancreatitis. Increased since 05/14/2018. The pancreatic tail and upstream body are either markedly atrophic or surgically absent. Low-density collection off the ventral pancreatic body measures 4.8 x 3.8 cm on 57/2, and is new since  05/14/2018. No peripancreatic edema. Spleen: Normal in size, without focal abnormality. Adrenals/Urinary Tract: Normal adrenal glands. Normal kidneys, without hydronephrosis. Normal urinary bladder. Stomach/Bowel: The gastric body is displaced anteriorly by the lesser sac collection. Proximal gastric underdistention with apparent soft tissue fullness including on 48/2. The gastric antrum is underdistended. Colonic stool burden suggests constipation. Normal terminal ileum. Normal small bowel. Vascular/Lymphatic: Aortic atherosclerosis. Splenic vein is absent, presumably chronically thrombosed. Gastroepiploic collaterals are secondary. Extensive collateral veins identified within the jejunal mesentery, including on 61/2. No abdominopelvic adenopathy. Reproductive: Normal prostate. Other: Trace cul-de-sac fluid.  No free intraperitoneal air. Musculoskeletal: L3-4 trans pedicle screw fixation. IMPRESSION: 1. No evidence of primary malignancy within the chest, abdomen, or pelvis. 2. Trace bilateral pleural effusions with small volume ascites. Question fluid overload. 3. Sequelae of chronic calcific pancreatitis within the head and uncinate process. Low-density collection about the anterior pancreatic body is most likely a pseudocyst, new since 05/14/2018. Consider CT or MRI follow-up at 3-6 months. 4. Proximal gastric underdistention. Apparent soft tissue fullness is likely secondary. Correlate with any symptoms to suggest gastritis or other gastric pathology. 5. 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 6. Coronary artery atherosclerosis. Aortic Atherosclerosis (ICD10-I70.0). Electronically Signed   By: Abigail Miyamoto M.D.   On: 03/24/2021 14:50   DG CHEST PORT 1 VIEW  Result Date: 03/24/2021 CLINICAL DATA:  Shortness of breath, weakness EXAM: PORTABLE CHEST 1 VIEW COMPARISON:  None. FINDINGS: The cardiomediastinal silhouette is within normal limits. No  pleural effusion. No pneumothorax. No large consolidation, very small linear subsegmental atelectasis in the left lung base. No acute osseous abnormality. IMPRESSION: No acute abnormality in the chest. Electronically Signed   By: Albin Felling M.D.   On: 03/24/2021 11:26   ECHOCARDIOGRAM COMPLETE  Result Date: 03/24/2021    ECHOCARDIOGRAM REPORT  Patient Name:   SAKAI WOLFORD Date of Exam: 03/23/2021 Medical Rec #:  [id number]    Height:       76.0 in Accession #:    [id number]   Weight:       173.5 lb Date of Birth:  24-May-1961   BSA:          2.085 m Patient Age:    29 years     BP:           98/57 mmHg Patient Gender: M            HR:           51 bpm. Exam Location:  Inpatient Procedure: 2D Echo, Cardiac Doppler and Color Doppler                               MODIFIED REPORT: This report was modified by Cherlynn Kaiser MD on 03/24/2021 due to revision.  Indications:     Shock  History:         Patient has no prior history of Echocardiogram examinations.                  Signs/Symptoms:Dyspnea and Alcohol abuse, pancreatitis, DKA;                  Risk Factors:Diabetes.  Sonographer:     Dustin Flock RDCS Referring Phys:  [place] Diagnosing Phys: Cherlynn Kaiser MD  Sonographer Comments: No subcostal window. IMPRESSIONS  1. Left ventricular ejection fraction, by estimation, is 50 to 55%. The left ventricle has low normal function. The left ventricle has no regional wall motion abnormalities. Left ventricular diastolic parameters were normal.  2. Right ventricular systolic function is mildly reduced. The right ventricular size is moderately enlarged.  3. The mitral valve is normal in structure. Trivial mitral valve regurgitation. No evidence of mitral stenosis.  4. The aortic valve is grossly normal. There is mild calcification of the aortic valve. Aortic valve regurgitation is not visualized. No aortic stenosis is present.  5. Aortic dilatation noted. There is mild dilatation of the aortic  root, measuring 42 mm. FINDINGS  Left Ventricle: Left ventricular ejection fraction, by estimation, is 50 to 55%. The left ventricle has low normal function. The left ventricle has no regional wall motion abnormalities. The left ventricular internal cavity size was normal in size. There is no left ventricular hypertrophy. Left ventricular diastolic parameters were normal. Right Ventricle: The right ventricular size is moderately enlarged. No increase in right ventricular wall thickness. Right ventricular systolic function is mildly reduced. Left Atrium: Left atrial size was normal in size. Right Atrium: Right atrial size was normal in size. Pericardium: There is no evidence of pericardial effusion. Mitral Valve: The mitral valve is normal in structure. Trivial mitral valve regurgitation. No evidence of mitral valve stenosis. Tricuspid Valve: The tricuspid valve is normal in structure. Tricuspid valve regurgitation is trivial. No evidence of tricuspid stenosis. Aortic Valve: The aortic valve is grossly normal. There is mild calcification of the aortic valve. Aortic valve regurgitation is not visualized. No aortic stenosis is present. Pulmonic Valve: The pulmonic valve was grossly normal. Pulmonic valve regurgitation is trivial. No evidence of pulmonic stenosis. Aorta: Aortic dilatation noted. There is mild dilatation of the aortic root, measuring 42 mm. Venous: The inferior vena cava was not well visualized. IAS/Shunts: No atrial level shunt detected by color flow Doppler.  LEFT VENTRICLE  PLAX 2D LVIDd:         6.10 cm  Diastology LVIDs:         3.90 cm  LV e' medial:    8.81 cm/s LV PW:         0.80 cm  LV E/e' medial:  5.9 LV IVS:        0.90 cm  LV e' lateral:   10.00 cm/s LVOT diam:     2.40 cm  LV E/e' lateral: 5.2 LV SV:         86 LV SV Index:   41 LVOT Area:     4.52 cm  RIGHT VENTRICLE RV Basal diam:  3.80 cm RV S prime:     10.40 cm/s TAPSE (M-mode): 2.9 cm LEFT ATRIUM             Index       RIGHT ATRIUM            Index LA diam:        3.60 cm 1.73 cm/m  RA Area:     14.20 cm LA Vol (A2C):   66.2 ml 31.74 ml/m RA Volume:   35.10 ml  16.83 ml/m LA Vol (A4C):   67.5 ml 32.37 ml/m LA Biplane Vol: 66.8 ml 32.03 ml/m  AORTIC VALVE LVOT Vmax:   90.70 cm/s LVOT Vmean:  60.600 cm/s LVOT VTI:    0.191 m  AORTA Ao Root diam: 4.20 cm MITRAL VALVE               TRICUSPID VALVE MV Area (PHT): 1.86 cm    TR Peak grad:   17.5 mmHg MV Decel Time: 407 msec    TR Vmax:        209.00 cm/s MV E velocity: 52.30 cm/s MV A velocity: 52.70 cm/s  SHUNTS MV E/A ratio:  0.99        Systemic VTI:  0.19 m                            Systemic Diam: 2.40 cm Cherlynn Kaiser MD Electronically signed by Cherlynn Kaiser MD Signature Date/Time: 03/24/2021/12:39:29 AM    Final (Updated)    [place] EKG SITE RITE  Result Date: 03/24/2021 If Site Rite image not attached, placement could not be confirmed due to current cardiac rhythm.  US Abdomen Limited RUQ (LIVER/GB)  Result Date: 03/29/2021 CLINICAL DATA:  Hepatic cysts on CT, unintentional weight loss, abdominal pain EXAM: ULTRASOUND ABDOMEN LIMITED RIGHT UPPER QUADRANT COMPARISON:  03/24/2021 FINDINGS: Gallbladder: Surgically absent. Common bile duct: Diameter: 8 mm Liver: Liver demonstrates normal echotexture. No focal abnormalities. Specifically, the punctate hypodensities seen on recent CT are too small to characterize by ultrasound, though are likely benign cysts based on long-term stability. No intrahepatic duct dilation. Portal vein is patent on color Doppler imaging with normal direction of blood flow towards the liver. Other: None. IMPRESSION: 1. Unremarkable right upper quadrant ultrasound in this patient status post cholecystectomy. Electronically Signed   By: Randa Ngo M.D.   On: 03/29/2021 22:01     Subjective: Patient is feeling better, no nausea or vomiting, no dizziness or lightheadedness.  Discharge Exam: Vitals:   04/06/21 2133 04/07/21 0522  BP: 121/72 104/61   Pulse: (!) 53 (!) 52  Resp:  18  Temp: 98.2 F (36.8 C) 98.5 F (36.9 C)  SpO2: 95% 94%   Vitals:   04/06/21 1342 04/06/21 2133 04/07/21 0500 04/07/21  0522  BP: 98/64 121/72  104/61  Pulse: 64 (!) 53  (!) 52  Resp: 16   18  Temp: 98.6 F (37 C) 98.2 F (36.8 C)  98.5 F (36.9 C)  TempSrc: Oral Oral  Oral  SpO2: 100% 95%  94%  Weight:   103.1 kg   Height:        General: Not in pain or dyspnea  Neurology: Awake and alert, non focal  E ENT: no pallor, no icterus, oral mucosa moist Cardiovascular: No JVD. S1-S2 present, rhythmic, no gallops, rubs, or murmurs. No lower extremity edema. Pulmonary: positive breath sounds bilaterally, adequate air movement, no wheezing, rhonchi or rales. Gastrointestinal. Abdomen soft and non tender Skin. No rashes Musculoskeletal: no joint deformities   The results of significant diagnostics from this hospitalization (including imaging, microbiology, ancillary and laboratory) are listed below for reference.     Microbiology: No results found for this or any previous visit (from the past 240 hour(s)).   Labs: BNP (last 3 results) Recent Labs    03/21/21 1552  BNP 89.3   Basic Metabolic Panel: Recent Labs  Lab 04/03/21 0458 04/04/21 0507 04/05/21 0249 04/06/21 0407 04/07/21 0417  NA 144 138 141 141 144  K 3.2* 3.2* 3.3* 3.0* 2.9*  CL 115* 105 110 109 109  CO2 $Re'25 27 27 27 31  'Lsn$ GLUCOSE 159* 205* 271* 228* 233*  BUN 23* 24* $Remov'20 17 16  'QRsRer$ CREATININE 0.58* 0.68 0.71 0.60* 0.65  CALCIUM 7.9* 7.8* 7.9* 8.0* 8.1*  MG  --  1.9  --   --  1.8  PHOS  --  4.2  --   --   --    Liver Function Tests: Recent Labs  Lab 04/05/21 0249  AST 20  ALT 27  ALKPHOS 42  BILITOT 0.7  PROT 4.6*  ALBUMIN 2.7*   No results for input(s): LIPASE, AMYLASE in the last 168 hours. No results for input(s): AMMONIA in the last 168 hours. CBC: Recent Labs  Lab 04/03/21 0458 04/04/21 0507  WBC 2.6* 2.4*  HGB 9.1* 9.5*  HCT 29.1* 29.4*  MCV 102.8*  101.0*  PLT 152 152   Cardiac Enzymes: No results for input(s): CKTOTAL, CKMB, CKMBINDEX, TROPONINI in the last 168 hours. BNP: Invalid input(s): POCBNP CBG: Recent Labs  Lab 04/06/21 0746 04/06/21 1146 04/06/21 1706 04/06/21 2051 04/07/21 0727  GLUCAP 209* 120* 201* 238* 184*   D-Dimer No results for input(s): DDIMER in the last 72 hours. Hgb A1c No results for input(s): HGBA1C in the last 72 hours. Lipid Profile No results for input(s): CHOL, HDL, LDLCALC, TRIG, CHOLHDL, LDLDIRECT in the last 72 hours. Thyroid function studies No results for input(s): TSH, T4TOTAL, T3FREE, THYROIDAB in the last 72 hours.  Invalid input(s): FREET3 Anemia work up No results for input(s): VITAMINB12, FOLATE, FERRITIN, TIBC, IRON, RETICCTPCT in the last 72 hours. Urinalysis    Component Value Date/Time   COLORURINE STRAW (A) 04/04/2021 1709   APPEARANCEUR CLEAR 04/04/2021 1709   LABSPEC 1.011 04/04/2021 1709   PHURINE 6.0 04/04/2021 1709   GLUCOSEU >=500 (A) 04/04/2021 1709   HGBUR NEGATIVE 04/04/2021 1709   BILIRUBINUR NEGATIVE 04/04/2021 1709   KETONESUR NEGATIVE 04/04/2021 1709   PROTEINUR NEGATIVE 04/04/2021 1709   NITRITE NEGATIVE 04/04/2021 1709   LEUKOCYTESUR NEGATIVE 04/04/2021 1709   Sepsis Labs Invalid input(s): PROCALCITONIN,  WBC,  LACTICIDVEN Microbiology No results found for this or any previous visit (from the past 240 hour(s)).   Time coordinating discharge: 25  minutes  SIGNED:   Tawni Millers, MD  [facility] 04/07/2021, 9:58 AM

## 2021-04-08 ENCOUNTER — Other Ambulatory Visit: Payer: Self-pay

## 2021-04-08 ENCOUNTER — Telehealth: Payer: Self-pay | Admitting: *Deleted

## 2021-04-08 MED ORDER — TRUEPLUS LANCETS 28G MISC
0 refills | Status: DC
  Filled 2021-04-08: qty 100, 25d supply, fill #0

## 2021-04-08 MED ORDER — GLUCOSE BLOOD VI STRP
ORAL_STRIP | 0 refills | Status: DC
  Filled 2021-04-08: qty 100, 25d supply, fill #0

## 2021-04-08 NOTE — Telephone Encounter (Signed)
Patient received 80 mEq of IV potassium before he left the hospital.  This should bring up his potassium to 3.5-3.7.  He can wait to get lab check when he sees me in clinic on Monday.

## 2021-04-08 NOTE — Telephone Encounter (Signed)
Patient called in to schedule HFU and to establish care. Previous PCP is in [place] and he now resides in [place]. D/c summary states he needs f/u on Potassium as it was 2.9 on 10/6. Please advise if patient can wait for repeat labs till appt on 10/10 at 1345. Also, should he take OTC potassium?

## 2021-04-08 NOTE — Telephone Encounter (Signed)
Patient notified of below. He was very Adult nurse.

## 2021-04-11 ENCOUNTER — Other Ambulatory Visit: Payer: Self-pay

## 2021-04-11 ENCOUNTER — Encounter: Payer: Self-pay | Admitting: Student

## 2021-04-11 ENCOUNTER — Ambulatory Visit (INDEPENDENT_AMBULATORY_CARE_PROVIDER_SITE_OTHER): Payer: Self-pay | Admitting: Student

## 2021-04-11 ENCOUNTER — Other Ambulatory Visit (HOSPITAL_COMMUNITY): Payer: Self-pay

## 2021-04-11 VITALS — BP 122/68 | HR 77 | Temp 97.8°F | Ht 76.0 in | Wt 225.9 lb

## 2021-04-11 DIAGNOSIS — E2740 Unspecified adrenocortical insufficiency: Secondary | ICD-10-CM

## 2021-04-11 DIAGNOSIS — R579 Shock, unspecified: Secondary | ICD-10-CM

## 2021-04-11 DIAGNOSIS — E876 Hypokalemia: Secondary | ICD-10-CM

## 2021-04-11 DIAGNOSIS — E1110 Type 2 diabetes mellitus with ketoacidosis without coma: Secondary | ICD-10-CM

## 2021-04-11 DIAGNOSIS — E8729 Other acidosis: Secondary | ICD-10-CM

## 2021-04-11 LAB — CORTISOL, URINE, FREE
Cortisol (Ur), Free: 1614 ug/24 hr — ABNORMAL HIGH (ref 5–64)
Cortisol,F,ug/L,U: 195 ug/L
Total Volume: 8275

## 2021-04-11 MED ORDER — METFORMIN HCL ER 500 MG PO TB24
500.0000 mg | ORAL_TABLET | Freq: Every day | ORAL | 2 refills | Status: DC
  Filled 2021-04-11: qty 30, 30d supply, fill #0

## 2021-04-11 NOTE — Patient Instructions (Addendum)
Thank you, [name] for allowing [place] to provide your care today. Today we discussed your recent hospitalization for DKA and adrenal insufficiency.  Continue taking your medications as prescribed and follow-up appointment at the end of the month.  Please bring your meter which return appointment.  I have ordered the following labs for you:  Lab Orders         Microalbumin / Creatinine Urine Ratio         BMP8+Anion Gap       I will call if any are abnormal. All of your labs can be accessed through "My Chart".  I have refilled your metformin XR 500 mg daily  My Chart Access: [email or web address]  Please follow-up in 3 weeks.   Please make sure to arrive 15 minutes prior to your next appointment. If you arrive late, you may be asked to reschedule.    We look forward to seeing you next time. Please call our clinic at [phone] if you have any questions or concerns. The best time to call is Monday-Friday from 9am-4pm, but there is someone available 24/7. If after hours or the weekend, call the main hospital number and ask for the Internal Medicine Resident On-Call. If you need medication refills, please notify your pharmacy one week in advance and they will send [place] a request.   Thank you for letting us take part in your care. Wishing you the best!  Steffanie Rainwater, MD 04/11/2021, 2:42 PM IM Resident, PGY-2 [name] 41:10

## 2021-04-11 NOTE — Progress Notes (Signed)
   CC: Hospital f/u, Establish care  HPI:  [name] is a 60 y.o. male with PMH as below who presents for a hospital follow up and to establish care. Please see problem based charting for evaluation, assessment and plan.  Past Medical History:  Diagnosis Date   Diabetes mellitus without complication (HCC)    Family history: Heart disease in paternal grandfather and brother. Breast cancer in sister and mother. Uterine cancer in maternal grandmother. Pancreatic cancer in uncle.  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 He recently moved from [place] to [place]. 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 Denies any tobacco or illicit drug use.   Review of Systems:  Constitutional: Negative for fever. Positive for fatigue  Eyes: Negative for visual changes Respiratory: Negative for shortness of breath Cardiac: Negative for chest pain Extremities: Positive for leg swelling Abdomen: Negative for abdominal pain, constipation or diarrhea GU: Negative for polyuria Neuro: Negative for headache, dizziness or weakness  Physical Exam: General: Pleasant, well-appearing middle-aged man. No acute distress. Neck: Supple. No JVD.  Cardiac: RRR. No murmurs, rubs or gallops. 1+ pitting edema Respiratory: Lungs CTAB. No wheezing or crackles. Abdominal: Soft, symmetric and non tender. Normal BS. Skin: Warm, dry and intact without rashes or lesions Extremities: Atraumatic. Full ROM. Palpable radial and DP pulses. Neuro: A&O x 3. Moves all extremities. Normal sensation.  Psych: Appropriate mood and affect.  Vitals:   04/11/21 1330  BP: 122/68  Pulse: 77  Temp: 97.8 F (36.6 C)  TempSrc: Oral  SpO2: 99%  Weight: 225 lb 14.4 oz (102.5 kg)  Height: 6\' 4"  (1.93 m)    Assessment & Plan:   See Encounters Tab for problem based charting.  Patient discussed with Dr.  , MD, MPH

## 2021-04-12 ENCOUNTER — Encounter: Payer: Self-pay | Admitting: Student

## 2021-04-12 DIAGNOSIS — E1110 Type 2 diabetes mellitus with ketoacidosis without coma: Secondary | ICD-10-CM | POA: Insufficient documentation

## 2021-04-12 DIAGNOSIS — E2740 Unspecified adrenocortical insufficiency: Secondary | ICD-10-CM

## 2021-04-12 DIAGNOSIS — E876 Hypokalemia: Secondary | ICD-10-CM | POA: Insufficient documentation

## 2021-04-12 HISTORY — DX: Unspecified adrenocortical insufficiency: E27.40

## 2021-04-12 LAB — BMP8+ANION GAP
Anion Gap: 14 mmol/L (ref 10.0–18.0)
BUN/Creatinine Ratio: 15 (ref 9–20)
BUN: 13 mg/dL (ref 6–24)
CO2: 27 mmol/L (ref 20–29)
Calcium: 8.3 mg/dL — ABNORMAL LOW (ref 8.7–10.2)
Chloride: 101 mmol/L (ref 96–106)
Creatinine, Ser: 0.87 mg/dL (ref 0.76–1.27)
Glucose: 233 mg/dL — ABNORMAL HIGH (ref 70–99)
Potassium: 3.4 mmol/L — ABNORMAL LOW (ref 3.5–5.2)
Sodium: 142 mmol/L (ref 134–144)
eGFR: 101 mL/min/{1.73_m2} (ref >59)

## 2021-04-12 LAB — MICROALBUMIN / CREATININE URINE RATIO
Creatinine, Urine: 26.3 mg/dL
Microalb/Creat Ratio: 14 mg/g creat (ref 0–29)
Microalbumin, Urine: 3.8 ug/mL

## 2021-04-12 NOTE — Assessment & Plan Note (Signed)
Patient was found to be hypokalemic during recent hospitalization with a low of 2.9 during day of discharge. He received KCl 80 mEq IV on day of discharge. Repeat BMP today shows potassium increased to 3.4. -- Repeat BMP at next office visit

## 2021-04-12 NOTE — Assessment & Plan Note (Signed)
During patient's recent hospitalization from 9/19-10/06 for DKA, he was found to be in shock and required ICU stay for 3 weeks.  He was found to have adrenal insufficiency after his basal cortisol was found very low down to 9.3, ACTH stimulation test increase cortisol to 23 and 26, 30 minutes and 60 minutes respectively. He was placed on midodrine, stress dose steroids and fludrocortisone.  This therapy was de-escalated to only hydrocortisone after his blood pressure did stabilize. He was discharged home on a steroid taper. He is much better since discharge.  He endorses lower extremity swelling and occasional fatigue but denies any dizziness, shortness of breath, headaches or nausea vomiting. Repeat BMP showed improvement in hypokalemia with a K of 3.4.  BP stable with systolic BP in the 120s.  Patient likely has relative adrenal insufficiency in the setting of acute stress state during hospitalization leading to inappropriately suppressed cortisol level. We will continue to monitor symptoms and re-evaluate on next office visit.  Plan: -- Continue slow steroid taper with Hydrocortisone 10 mg starting on 10/06 (take 5 tablets twice daily for 5 days, then 2 tablets twice daily for 5 days, then 2 tablets daily for 5 days, then 1 tablet daily for 5 days) -- Continue compression stocking for leg edema -- Return precautions given to patient.

## 2021-04-12 NOTE — Assessment & Plan Note (Signed)
Patient with history of type 2 diabetes recently hospitalized for DKA with hospital course complicated by during the stay in the ICU for shock due to adrenal insufficiency.  He is here for hospital follow-up and to establish care.  A1c 11.4 3 weeks ago during hospitalization.  Patient reports she was diagnosed with diabetes in 2019 controlled on metformin and Modifications. His A1c was ropped from 6.5->5.6 in April so his metformin dose decreased to 500 mg daily.  He reports eating a healthy diet for the last few months preceding his hospitalization.  Since discharge from the hospital, he continues to feel better.  He notes occasional fatigue but denies any dizziness, headaches, shortness of breath, weakness, abdominal pain, chest pain or blurry vision. He does endorse lower extremity swelling which is managed with compression socks.  He has been adherent to his insulin 70/30 22 units twice daily with meals.  Plan: -- Continue NovoLog 70/30 mix titration with steroid taper -- Refilled metformin 500 mg daily, escalate dose at next office visit. -- Follow-up in 2 to 3 weeks

## 2021-04-13 NOTE — Progress Notes (Signed)
Internal Medicine Clinic Attending  Case discussed with Dr. Amponsah  At the time of the visit.  We reviewed the resident's history and exam and pertinent patient test results.  I agree with the assessment, diagnosis, and plan of care documented in the resident's note.  

## 2021-04-19 ENCOUNTER — Other Ambulatory Visit: Payer: Self-pay

## 2021-04-19 ENCOUNTER — Other Ambulatory Visit: Payer: Self-pay | Admitting: *Deleted

## 2021-04-19 MED ORDER — INSULIN ASPART PROT & ASPART (70-30 MIX) 100 UNIT/ML PEN
PEN_INJECTOR | SUBCUTANEOUS | 0 refills | Status: DC
  Filled 2021-04-19: qty 15, fill #0

## 2021-04-19 NOTE — Telephone Encounter (Signed)
Requesting to speak with a nurse about getting refill on insulin, please call back.

## 2021-04-19 NOTE — Telephone Encounter (Signed)
Thanks. I will refill it for him.

## 2021-04-19 NOTE — Telephone Encounter (Signed)
Return pt's call - stated he has an appt with Dr Kirke Corin 04/28/21 but needs a refill on Novolog 70/30; stated he takes 22 units BID and will run out by the w/e. Thanks

## 2021-04-21 ENCOUNTER — Other Ambulatory Visit (HOSPITAL_COMMUNITY): Payer: Self-pay

## 2021-04-21 MED ORDER — INSULIN ASPART PROT & ASPART (70-30 MIX) 100 UNIT/ML PEN
PEN_INJECTOR | SUBCUTANEOUS | 0 refills | Status: DC
  Filled 2021-04-21: qty 15, fill #0
  Filled 2021-04-22: qty 6, 30d supply, fill #0

## 2021-04-21 NOTE — Addendum Note (Signed)
Addended by: Fredderick Severance on: 04/21/2021 02:31 PM   Modules accepted: Orders

## 2021-04-21 NOTE — Telephone Encounter (Signed)
Patient called in stating he will run out of novolog 70/30 by tomorrow. States he is not able to use [facility] and is requesting refill sent to [facility]. All other pharmacies removed from list.

## 2021-04-22 ENCOUNTER — Other Ambulatory Visit (HOSPITAL_COMMUNITY): Payer: Self-pay

## 2021-04-28 ENCOUNTER — Other Ambulatory Visit (HOSPITAL_COMMUNITY): Payer: Self-pay

## 2021-04-28 ENCOUNTER — Other Ambulatory Visit: Payer: Self-pay

## 2021-04-28 ENCOUNTER — Encounter: Payer: Self-pay | Admitting: Student

## 2021-04-28 ENCOUNTER — Ambulatory Visit (INDEPENDENT_AMBULATORY_CARE_PROVIDER_SITE_OTHER): Payer: Self-pay | Admitting: Student

## 2021-04-28 VITALS — BP 109/66 | HR 85 | Temp 97.6°F | Ht 76.0 in | Wt 198.9 lb

## 2021-04-28 DIAGNOSIS — E1110 Type 2 diabetes mellitus with ketoacidosis without coma: Secondary | ICD-10-CM

## 2021-04-28 DIAGNOSIS — E876 Hypokalemia: Secondary | ICD-10-CM

## 2021-04-28 DIAGNOSIS — E2740 Unspecified adrenocortical insufficiency: Secondary | ICD-10-CM

## 2021-04-28 MED ORDER — PANCRELIPASE (LIP-PROT-AMYL) 36000-114000 UNITS PO CPEP
36000.0000 [IU] | ORAL_CAPSULE | Freq: Three times a day (TID) | ORAL | 2 refills | Status: DC
  Filled 2021-04-28: qty 100, 34d supply, fill #0

## 2021-04-28 MED ORDER — INSULIN ASPART PROT & ASPART (70-30 MIX) 100 UNIT/ML PEN
18.0000 [IU] | PEN_INJECTOR | Freq: Two times a day (BID) | SUBCUTANEOUS | 1 refills | Status: DC
  Filled 2021-04-28 – 2021-05-08 (×2): qty 9, 25d supply, fill #0

## 2021-04-28 MED ORDER — GABAPENTIN 300 MG PO CAPS
300.0000 mg | ORAL_CAPSULE | Freq: Two times a day (BID) | ORAL | 3 refills | Status: DC
  Filled 2021-04-28: qty 90, 45d supply, fill #0

## 2021-04-28 MED ORDER — GABAPENTIN 300 MG PO CAPS
300.0000 mg | ORAL_CAPSULE | Freq: Two times a day (BID) | ORAL | 3 refills | Status: DC
  Filled 2021-04-28: qty 90, 45d supply, fill #0
  Filled 2021-04-29 – 2021-05-08 (×2): qty 60, 30d supply, fill #0
  Filled 2021-06-07: qty 60, 30d supply, fill #1
  Filled 2021-07-06: qty 60, 30d supply, fill #2
  Filled 2021-08-10: qty 60, 30d supply, fill #3
  Filled 2021-09-14: qty 60, 30d supply, fill #4

## 2021-04-28 MED ORDER — METFORMIN HCL ER 500 MG PO TB24
500.0000 mg | ORAL_TABLET | Freq: Two times a day (BID) | ORAL | 3 refills | Status: DC
  Filled 2021-04-28: qty 60, 30d supply, fill #0

## 2021-04-28 NOTE — Patient Instructions (Addendum)
Thank you, [name] for allowing [place] to provide your care today. Today we discussed your diabetes and adrenal insufficiency. Your blood sugars remains high so we adjusted your insulin to help bring this down. Continue to exercise and make the necessary dietary changes to bring down your blood sugar.  I have ordered the following labs for you:  Lab Orders         BMP8+Anion Gap     I will call if any are abnormal. All of your labs can be accessed through "My Chart".   I have ordered the following medication/changed the following medications:  Increase your NovoLog 70/30 to 18 units twice daily before meals  My Chart Access: [email or web address]  Please follow-up in 2 weeks  Please make sure to arrive 15 minutes prior to your next appointment. If you arrive late, you may be asked to reschedule.    We look forward to seeing you next time. Please call our clinic at [phone] if you have any questions or concerns. The best time to call is Monday-Friday from 9am-4pm, but there is someone available 24/7. If after hours or the weekend, call the main hospital number and ask for the Internal Medicine Resident On-Call. If you need medication refills, please notify your pharmacy one week in advance and they will send [place] a request.   Thank you for letting us take part in your care. Wishing you the best!  Allen Rainwater, MD 04/28/2021, 11:36 AM IM Resident, PGY-2 Allen Russell 41:10

## 2021-04-28 NOTE — Assessment & Plan Note (Addendum)
Patient here for diabetes follow-up after recent hospitalization for DKA. He was started on NovoLog 70/30 22 units twice daily and has been decreasing the dose of his insulin based on his steroid taper. He is currently down to 14 units twice daily. Download from his meter shows patient has been within target 22.2%, above target 77.8% and below target 0%. Average glucose of 199 with high of 477 and low of 79. Patient is now off steroids. We will increase his insulin slightly and monitor for improvement in his glucose.  Plan: -- Increase NovoLog 70/30 mix to 18 units twice daily -- Increase metformin from 500 mg daily to 500 mg twice daily --Refilled gabapentin 300 mg twice daily -- Follow-up in 2 weeks for re-evaluation

## 2021-04-28 NOTE — Progress Notes (Signed)
   CC: Diabetes follow-up  HPI:  [name] is a 60 y.o. male with PMH as below who presents to the clinic for follow-up on his diabetes. Please see problem based charting for evaluation, assessment and plan.  Past Medical History:  Diagnosis Date   Diabetes mellitus without complication (HCC)     Review of Systems:  Constitutional: Negative for fever, polydipsia or fatigue Eyes: Negative for visual changes Respiratory: Negative for shortness of breath Cardiac: Negative for chest pain GU: Negative for polyuria Abdomen: Negative for abdominal pain, constipation or diarrhea Neuro: Negative for headache or weakness  Physical Exam: General: Pleasant, middle-age man.  No acute distress. Cardiac: RRR. No murmurs, rubs or gallops. No LE edema Respiratory: Lungs CTAB. No wheezing or crackles. Abdominal: Soft, symmetric and non tender. Normal BS. Skin: Warm, dry and intact without rashes or lesions Extremities: Atraumatic. Full ROM. Palpable radial and DP pulses. Neuro: A&O x 3. Moves all extremities Psych: Appropriate mood and affect.  Vitals:   04/28/21 1035  BP: 109/66  Pulse: 85  Temp: 97.6 F (36.4 C)  TempSrc: Oral  SpO2: 100%  Weight: 198 lb 14.4 oz (90.2 kg)  Height: 6\' 4"  (1.93 m)    Assessment & Plan:   See Encounters Tab for problem based charting.  Patient discussed with Dr. , MD, MPH

## 2021-04-28 NOTE — Assessment & Plan Note (Addendum)
Patient following up on adrenal insufficiency after tapering off steroids. Patient reports that his last steroid dose was 2 days ago. Reports his leg edema has improved while tapering off the steroids and he does not currently have 80 leg swelling. His fatigue has improved and continues to deny dizziness, shortness of breath, headaches, abdominal pain, nausea or vomiting.  BP is slightly soft but stable at 109/66. -- Follow-up BMP  Addendum:  BMP shows normal kidney function and no electrolyte abnormalities.  Potassium improved to 4.6

## 2021-04-28 NOTE — Assessment & Plan Note (Addendum)
Pending repeat BMP today  Addendum: Hypokalemia has resolved.  BMP shows potassium of 4.6 today.

## 2021-04-28 NOTE — Progress Notes (Deleted)
   CC: ***  HPI:  [name] is a 60 y.o.  Please see problem based charting for evaluation, assessment and plan.  Past Medical History:  Diagnosis Date   Diabetes mellitus without complication (HCC)     Review of Systems:  Constitutional: Negative for fever or fatigue Eyes: Negative for visual changes Respiratory: Negative for shortness of breath Cardiac: Negative for chest pain MSK: Negative for back pain Abdomen: Negative for abdominal pain, constipation or diarrhea Neuro: Negative for headache or weakness  Physical Exam: General: Pleasant *** No acute distress. Cardiac: RRR. No murmurs, rubs or gallops. No LE edema Respiratory: Lungs CTAB. No wheezing or crackles. Abdominal: Soft, symmetric and non tender. Normal BS. Skin: Warm, dry and intact without rashes or lesions Extremities: Atraumatic. Full ROM. Palpable radial and DP pulses. Neuro: A&O x 3. Moves all extremities Psych: Appropriate mood and affect.  Vitals:   04/28/21 1035  BP: 109/66  Pulse: 85  Temp: 97.6 F (36.4 C)  TempSrc: Oral  SpO2: 100%  Weight: 198 lb 14.4 oz (90.2 kg)  Height: 6\' 4"  (1.93 m)   ***  Assessment & Plan:   See Encounters Tab for problem based charting.  Patient {GC/GE:[id number]::"discussed with","seen with"} Dr. {NAMES:[id number]::"Butcher","Guilloud","Hoffman","Mullen","Narendra","Raines","Vincent"}  , MD, MPH

## 2021-04-29 ENCOUNTER — Other Ambulatory Visit (HOSPITAL_COMMUNITY): Payer: Self-pay

## 2021-04-29 ENCOUNTER — Other Ambulatory Visit: Payer: Self-pay

## 2021-04-29 LAB — BMP8+ANION GAP
Anion Gap: 15 mmol/L (ref 10.0–18.0)
BUN/Creatinine Ratio: 12 (ref 9–20)
BUN: 10 mg/dL (ref 6–24)
CO2: 23 mmol/L (ref 20–29)
Calcium: 9.5 mg/dL (ref 8.7–10.2)
Chloride: 101 mmol/L (ref 96–106)
Creatinine, Ser: 0.84 mg/dL (ref 0.76–1.27)
Glucose: 320 mg/dL — ABNORMAL HIGH (ref 70–99)
Potassium: 4.6 mmol/L (ref 3.5–5.2)
Sodium: 139 mmol/L (ref 134–144)
eGFR: 102 mL/min/{1.73_m2} (ref >59)

## 2021-04-29 MED ORDER — PANCRELIPASE (LIP-PROT-AMYL) 36000-114000 UNITS PO CPEP
36000.0000 [IU] | ORAL_CAPSULE | Freq: Three times a day (TID) | ORAL | 2 refills | Status: DC
  Filled 2021-04-29: qty 100, 34d supply, fill #0
  Filled 2021-06-07: qty 100, 34d supply, fill #1
  Filled 2021-07-06: qty 100, 34d supply, fill #2
  Filled 2021-07-06: qty 90, 30d supply, fill #0
  Filled 2021-09-14: qty 90, 30d supply, fill #1

## 2021-04-29 NOTE — Addendum Note (Signed)
Addended bySharrell Ku on: 04/29/2021 04:36 PM   Modules accepted: Orders

## 2021-04-29 NOTE — Progress Notes (Signed)
Internal Medicine Clinic Attending  Case discussed with Dr. Amponsah  At the time of the visit.  We reviewed the resident's history and exam and pertinent patient test results.  I agree with the assessment, diagnosis, and plan of care documented in the resident's note.  

## 2021-05-02 ENCOUNTER — Other Ambulatory Visit: Payer: Self-pay

## 2021-05-04 ENCOUNTER — Other Ambulatory Visit: Payer: Self-pay

## 2021-05-09 ENCOUNTER — Other Ambulatory Visit (HOSPITAL_COMMUNITY): Payer: Self-pay

## 2021-05-12 ENCOUNTER — Encounter: Payer: Self-pay | Admitting: Student

## 2021-05-12 ENCOUNTER — Other Ambulatory Visit (HOSPITAL_COMMUNITY): Payer: Self-pay

## 2021-05-12 ENCOUNTER — Other Ambulatory Visit: Payer: Self-pay

## 2021-05-12 ENCOUNTER — Ambulatory Visit (INDEPENDENT_AMBULATORY_CARE_PROVIDER_SITE_OTHER): Payer: Self-pay | Admitting: Student

## 2021-05-12 VITALS — BP 103/76 | HR 85 | Temp 97.9°F | Resp 28 | Ht 76.0 in | Wt 205.1 lb

## 2021-05-12 DIAGNOSIS — E1110 Type 2 diabetes mellitus with ketoacidosis without coma: Secondary | ICD-10-CM

## 2021-05-12 MED ORDER — INSULIN ASPART PROT & ASPART (70-30 MIX) 100 UNIT/ML PEN: 16.0000 [IU] | PEN_INJECTOR | Freq: Two times a day (BID) | SUBCUTANEOUS | 1 refills | Status: DC

## 2021-05-12 MED ORDER — METFORMIN HCL 1000 MG PO TABS
1000.0000 mg | ORAL_TABLET | Freq: Two times a day (BID) | ORAL | 0 refills | Status: DC
  Filled 2021-05-12: qty 60, 30d supply, fill #0

## 2021-05-12 NOTE — Patient Instructions (Signed)
Mr.Nesanel L Komperda, it was a pleasure seeing you today!  Today we discussed: - Diabetes: I would like for you to decrease the insulin you are taking. Please take 16 units twice daily. In addition, I would like for you to increase the metformin to 1000mg  twice daily.  - If you have any low sugars, I would like for you to take some juice, a glucose tablet, and re-check in 10 minutes. Continue doing this until your sugar has improved.  Follow-up:  2 weeks    Please make sure to arrive 15 minutes prior to your next appointment. If you arrive late, you may be asked to reschedule.   We look forward to seeing you next time. Please call our clinic at [phone] if you have any questions or concerns. The best time to call is Monday-Friday from 9am-4pm, but there is someone available 24/7. If after hours or the weekend, call the main hospital number and ask for the Internal Medicine Resident On-Call. If you need medication refills, please notify your pharmacy one week in advance and they will send [phone] a request.  Thank you for letting us take part in your care. Wishing you the best!  Thank you, [place], MD

## 2021-05-12 NOTE — Assessment & Plan Note (Addendum)
Patient is presenting today for two week follow-up visit for diabetes. During his most recent visit, his insulin was decreased from 22 units to 18 units twice daily. Today he brought his glucometer, which reveals average sugars 134 since last visit. Patient only had one reading above 200. There is a mention of two hypoglycemic episodes that occurred after dinner (CBG 60, 67). Patient reports taking his insulin before breakfast and before dinner. During those episodes patient does report feeling symptomatic.  Overall, patient's sugars over the last week have largely been at goal. We will have a slight decrease in insulin regimens in order to decrease the risk of further hypoglycemic episodes. I have also explained that he should drink a glass of juice/take glucose tablet and re-check his sugar in ~10 minutes if he has further hypoglycemic episodes. In addition, we will maximize metformin therapy.  - Decrease Novolin 70/30 16 units twice daily - Increase metformin 1000mg  twice daily - Hypoglycemic precautions given - Return in two weeks while insulin is being adjusted

## 2021-05-12 NOTE — Progress Notes (Signed)
   CC: diabetes follow-up  HPI:  [name] is a 60 y.o. with type II diabetes mellitus, adrenal insufficiency 2/2 septic shock presenting to [facility] for diabetes follow-up.  Please see problem-based list for further details.  Past Medical History:  Diagnosis Date   Diabetes mellitus without complication (HCC)    Shock (HCC)    Review of Systems:  As per HPI  Physical Exam:  Vitals:   05/12/21 1046  BP: 103/76  Pulse: 85  Resp: (!) 28  Temp: 97.9 F (36.6 C)  TempSrc: Oral  SpO2: 100%  Weight: 205 lb 1.6 oz (93 kg)  Height: 6\' 4"  (1.93 m)   General: Resting comfortably in chair, no acute distress CV: Regular rate, rhythm. No murmurs appreciated. Pulm: Normal work of breathing on room air. Clear to auscultation bilaterally. MSK: Normal bulk, tone. No pitting edema bilaterally.  Assessment & Plan:   See Encounters Tab for problem based charting.  Patient discussed with Dr.  

## 2021-05-13 NOTE — Progress Notes (Signed)
Internal Medicine Clinic Attending ? ?Case discussed with Dr. Braswell  At the time of the visit.  We reviewed the resident?s history and exam and pertinent patient test results.  I agree with the assessment, diagnosis, and plan of care documented in the resident?s note.  ?

## 2021-05-25 ENCOUNTER — Encounter: Payer: Medicaid Other | Admitting: Student

## 2021-05-30 ENCOUNTER — Other Ambulatory Visit: Payer: Self-pay

## 2021-05-30 ENCOUNTER — Other Ambulatory Visit (HOSPITAL_COMMUNITY): Payer: Self-pay

## 2021-05-30 ENCOUNTER — Ambulatory Visit (INDEPENDENT_AMBULATORY_CARE_PROVIDER_SITE_OTHER): Payer: Self-pay | Admitting: Student

## 2021-05-30 ENCOUNTER — Encounter: Payer: Self-pay | Admitting: Student

## 2021-05-30 VITALS — BP 93/50 | HR 75 | Temp 98.2°F | Ht 76.0 in | Wt 205.9 lb

## 2021-05-30 DIAGNOSIS — E1110 Type 2 diabetes mellitus with ketoacidosis without coma: Secondary | ICD-10-CM

## 2021-05-30 MED ORDER — LANTUS SOLOSTAR 100 UNIT/ML ~~LOC~~ SOPN
20.0000 [IU] | PEN_INJECTOR | Freq: Every day | SUBCUTANEOUS | 0 refills | Status: DC
  Filled 2021-05-30: qty 6, 30d supply, fill #0

## 2021-05-30 MED ORDER — INSULIN PEN NEEDLE 32G X 4 MM MISC
1.0000 | Freq: Four times a day (QID) | 3 refills | Status: AC
  Filled 2021-05-30: qty 100, 25d supply, fill #0
  Filled 2021-07-06: qty 100, 25d supply, fill #1
  Filled 2021-08-10: qty 100, 25d supply, fill #2
  Filled 2021-09-14: qty 100, 25d supply, fill #3

## 2021-05-30 MED ORDER — NOVOLOG FLEXPEN 100 UNIT/ML ~~LOC~~ SOPN
0.0000 [IU] | PEN_INJECTOR | Freq: Three times a day (TID) | SUBCUTANEOUS | 0 refills | Status: DC
Start: 2021-05-30 — End: 2021-07-28
  Filled 2021-05-30: qty 12, 26d supply, fill #0

## 2021-05-30 NOTE — Progress Notes (Signed)
   CC: diabetes follow-up  HPI:  [name] is a 60 y.o. with type II diabetes mellitus, adrenal insufficiency 2/2 septic shock presenting to [facility] for diabetes follow-up.  Please see problem-based list for further details.  Past Medical History:  Diagnosis Date   Diabetes mellitus without complication (HCC)    Shock (HCC)    Review of Systems:  As per HPI  Physical Exam:  Vitals:   05/30/21 1320  BP: (!) 93/50  Pulse: 75  Temp: 98.2 F (36.8 C)  TempSrc: Oral  SpO2: 98%  Weight: 205 lb 14.4 oz (93.4 kg)  Height: 6\' 4"  (1.93 m)   General: Resting comfortably in chair, no acute distress CV: Regular rate, rhythm. No murmurs appreciated. Pulm: Normal work of breathing on room air. Clear to ausculation bilaterally Neuro: Awake, alert, conversing appropriately. No focal deficits. Psych: Normal mood, affect, speech.  Assessment & Plan:   See Encounters Tab for problem based charting.  Patient discussed with Dr. 

## 2021-05-30 NOTE — Patient Instructions (Addendum)
Allen Russell, it was a pleasure seeing you today!  Today we discussed: - I am switching your insulins to a long-acting and a short-acting insulin.  - Take Lantus (glargine) 20 units at night time. - Take Novolog (aspart) with each meal. Please see the scale below to determine how much you can take with each meal:  70-120: 0 units 121-150: 2 units 151-200: 3 units 201-250: 5 units 251-300: 8 units 301-350: 11 units 351-400: 15 units >400: 20 units  I have ordered the following medication/changed the following medications:   Stop the following medications: Medications Discontinued During This Encounter  Medication Reason   insulin aspart protamine - aspart (NOVOLOG 70/30 MIX) (70-30) 100 UNIT/ML FlexPen      Start the following medications: Meds ordered this encounter  Medications   insulin glargine (LANTUS SOLOSTAR) 100 UNIT/ML Solostar Pen    Sig: Inject 20 Units into the skin at bedtime.    Dispense:  15 mL    Refill:  0    IM program   insulin aspart (NOVOLOG FLEXPEN) 100 UNIT/ML FlexPen    Sig: Inject 0-15 Units into the skin 3 (three) times daily with meals.    Dispense:  15 mL    Refill:  0    IM program     Follow-up:  2 weeks    Please make sure to arrive 15 minutes prior to your next appointment. If you arrive late, you may be asked to reschedule.   We look forward to seeing you next time. Please call our clinic at [phone] if you have any questions or concerns. The best time to call is Monday-Friday from 9am-4pm, but there is someone available 24/7. If after hours or the weekend, call the main hospital number and ask for the Internal Medicine Resident On-Call. If you need medication refills, please notify your pharmacy one week in advance and they will send [place] a request.  Thank you for letting us take part in your care. Wishing you the best!  Thank you, Evlyn Kanner, MD

## 2021-05-30 NOTE — Assessment & Plan Note (Signed)
Mr. Allen Russell is presenting today for diabetes follow-up from two weeks ago. At that visit patient's Novolin was decreased from 18 units twice daily to 16 units twice daily due to hypoglycemic episodes. Today Mr. Allen Russell reports he has continued to have hypoglycemic episodes, including a CBG of 45 ~3 hours after Thanksgiving dinner. During this episodes Mr. Allen Russell does report being symptomatic, including cold sweats. Mentions he has been checking his sugar and taking 16 units of Novolin prior to breakfast and dinner. States his meals have been consistent, noting his dinners usually consist of a salad and protein.  Patient did bring one of his meters today. Since his last visit on 11/10, only two readings of >300, one reading in 200's, and two hypoglycemic episodes. Otherwise sugars appear to be controlled <150. Given the variability of patient's sugars with Novolin, we will switch to long and short acting insulins today. We will initiate 20 units of Lantus with a sliding scale for mealtime coverage. I have explained the sliding scale to Mr. Allen Russell and have given him instructions on his AVS. We will have him return in two weeks to re-check his sugars.   - Stop Novolin 70/30 - Start Lantus 20 units nightly before bed - Start sliding scale Novolog three times daily with meals - Continue metformin 1000mg  twice daily - Return in two weeks for further insulin adjustment as neded

## 2021-06-07 ENCOUNTER — Other Ambulatory Visit: Payer: Self-pay | Admitting: Student

## 2021-06-07 ENCOUNTER — Other Ambulatory Visit (HOSPITAL_COMMUNITY): Payer: Self-pay

## 2021-06-07 ENCOUNTER — Other Ambulatory Visit: Payer: Self-pay

## 2021-06-07 DIAGNOSIS — E1110 Type 2 diabetes mellitus with ketoacidosis without coma: Secondary | ICD-10-CM

## 2021-06-07 MED ORDER — METFORMIN HCL 1000 MG PO TABS
1000.0000 mg | ORAL_TABLET | Freq: Two times a day (BID) | ORAL | 3 refills | Status: DC
  Filled 2021-06-07: qty 60, 30d supply, fill #0
  Filled 2021-07-06: qty 60, 30d supply, fill #1
  Filled 2021-08-10: qty 60, 30d supply, fill #2
  Filled 2021-09-14: qty 60, 30d supply, fill #3
  Filled 2021-10-16: qty 60, 30d supply, fill #4
  Filled 2021-11-15: qty 60, 30d supply, fill #5
  Filled 2021-11-16: qty 60, 30d supply, fill #0

## 2021-06-08 NOTE — Progress Notes (Signed)
Internal Medicine Clinic Attending ? ?Case discussed with Dr. Braswell  At the time of the visit.  We reviewed the resident?s history and exam and pertinent patient test results.  I agree with the assessment, diagnosis, and plan of care documented in the resident?s note.  ?

## 2021-06-09 ENCOUNTER — Other Ambulatory Visit (HOSPITAL_COMMUNITY): Payer: Self-pay

## 2021-06-09 ENCOUNTER — Other Ambulatory Visit: Payer: Self-pay

## 2021-06-13 ENCOUNTER — Encounter: Payer: Medicaid Other | Admitting: Internal Medicine

## 2021-06-28 ENCOUNTER — Other Ambulatory Visit (HOSPITAL_COMMUNITY): Payer: Self-pay

## 2021-06-28 ENCOUNTER — Other Ambulatory Visit: Payer: Self-pay

## 2021-06-28 ENCOUNTER — Encounter: Payer: Self-pay | Admitting: Internal Medicine

## 2021-06-28 ENCOUNTER — Ambulatory Visit (INDEPENDENT_AMBULATORY_CARE_PROVIDER_SITE_OTHER): Payer: Self-pay | Admitting: Internal Medicine

## 2021-06-28 DIAGNOSIS — E1110 Type 2 diabetes mellitus with ketoacidosis without coma: Secondary | ICD-10-CM

## 2021-06-28 DIAGNOSIS — K8689 Other specified diseases of pancreas: Secondary | ICD-10-CM

## 2021-06-28 MED ORDER — TRUEPLUS LANCETS 28G MISC
0 refills | Status: AC
  Filled 2021-06-28: qty 100, 90d supply, fill #0
  Filled 2021-12-11: qty 100, 100d supply, fill #0

## 2021-06-28 MED ORDER — LANTUS SOLOSTAR 100 UNIT/ML ~~LOC~~ SOPN
17.0000 [IU] | PEN_INJECTOR | Freq: Every day | SUBCUTANEOUS | 0 refills | Status: DC
Start: 2021-06-28 — End: 2021-07-13
  Filled 2021-06-28: qty 6, 30d supply, fill #0

## 2021-06-28 NOTE — Progress Notes (Signed)
° °  CC: Blood sugar follow-up  HPI:  [name] is a 60 y.o. person, with a PMH noted below, who presents to the clinic for management for his diabetes. To see the management of their acute and chronic conditions, please see the A&P note under the Encounters tab.   Past Medical History:  Diagnosis Date   Diabetes mellitus without complication (HCC)    Shock (HCC)    Review of Systems:   Review of Systems  Constitutional:  Negative for chills, diaphoresis, fever, malaise/fatigue and weight loss.  Eyes:  Negative for blurred vision and double vision.  Gastrointestinal:  Negative for abdominal pain, constipation, diarrhea, nausea and vomiting.  Neurological:  Negative for dizziness and headaches.    Physical Exam:  Vitals:   06/28/21 1008  BP: 119/71  Pulse: 62  Temp: 97.8 F (36.6 C)  TempSrc: Oral  SpO2: 100%  Weight: 207 lb 1.6 oz (93.9 kg)  Height: 6\' 4"  (1.93 m)   Physical Exam Constitutional:      General: He is not in acute distress.    Appearance: Normal appearance. He is not ill-appearing or toxic-appearing.  HENT:     Head: Normocephalic and atraumatic.  Eyes:     General:        Right eye: No discharge.        Left eye: No discharge.     Conjunctiva/sclera: Conjunctivae normal.  Cardiovascular:     Rate and Rhythm: Normal rate and regular rhythm.     Pulses: Normal pulses.     Heart sounds: Normal heart sounds. No murmur heard.   No friction rub. No gallop.  Pulmonary:     Effort: Pulmonary effort is normal.     Breath sounds: Normal breath sounds. No wheezing, rhonchi or rales.  Abdominal:     General: Bowel sounds are normal.     Palpations: Abdomen is soft.     Tenderness: There is no abdominal tenderness. There is no guarding.  Musculoskeletal:        General: No swelling.     Right lower leg: No edema.     Left lower leg: No edema.  Neurological:     Mental Status: He is alert.  Psychiatric:        Mood and Affect: Mood normal.         Behavior: Behavior normal.     Assessment & Plan:   See Encounters Tab for problem based charting.  Patient discussed with Dr. 

## 2021-06-28 NOTE — Assessment & Plan Note (Signed)
Patient with recent changes in his diabetes regimen presents to the office today for ongoing medication optimization.  Mr. Krasinski states that he has been compliant with his 20 units of Lantus and has hardly had to use a short acting insulin since switching over 2 weeks ago.  He brings in his glucose monitor today which is consistent morning insulin levels in the 50s and 60s.  He states that he is typically asymptomatic until he reaches the 40s and 13s.  His highest reading is 254 once with multiple morning readings in the 50s and 60s and afternoon readings in the 110s to 130s.  We will decrease his long-acting at this time. - Decrease Lantus to 17 units daily - Continue sliding scale insulin - Follow-up in 2 weeks with glucose monitor.

## 2021-06-28 NOTE — Progress Notes (Signed)
Internal Medicine Clinic Attending ? ?Case discussed with Dr. Winters  At the time of the visit.  We reviewed the resident?s history and exam and pertinent patient test results.  I agree with the assessment, diagnosis, and plan of care documented in the resident?s note.  ?

## 2021-06-28 NOTE — Assessment & Plan Note (Signed)
Patient states that he is currently on Creon 36,000 units 3 times daily, but continues to have abdominal pain and diarrhea. He was on Zenpep in the past, which controlled his symptoms.  We further discussed he was on maximum dosing of Creon today, but we could attempt to place a Zenpep order.  Patient states that his symptoms are tolerable at this time, but will let the clinic know if his symptoms continue to worsen. - Continue Creon 36,000 units 3 times daily with meals

## 2021-06-28 NOTE — Patient Instructions (Addendum)
To Mr. Mantell,   It was a pleasure meeting you today! Today we discussed your blood sugars and pancreatic insufficiency medication. For your blood sugar, we will decrease your long acting Insulin to Lantus 17 units from 20 units. Please continue to take it nightly. We will have you follow up with [place] in 2 weeks to reassess your sugar levels. For your pancreatic insufficiency medications, you are currently on the maximum dose of Creon. I will reorder Zenpep, since it worked well for you, and see if it is covered by your insurance. If it is not, we will write a letter to see if you can medically clear it as Creon has not helped.  Have a happy new year! Dolan Amen, MD

## 2021-06-29 ENCOUNTER — Other Ambulatory Visit: Payer: Self-pay

## 2021-06-30 ENCOUNTER — Other Ambulatory Visit: Payer: Self-pay | Admitting: *Deleted

## 2021-06-30 NOTE — Telephone Encounter (Signed)
Patient called needs the True Metrix Strips sent to the [facility] under IM Program.

## 2021-07-01 ENCOUNTER — Other Ambulatory Visit: Payer: Self-pay

## 2021-07-01 ENCOUNTER — Other Ambulatory Visit (HOSPITAL_COMMUNITY): Payer: Self-pay

## 2021-07-01 MED ORDER — GLUCOSE BLOOD VI STRP
ORAL_STRIP | 0 refills | Status: DC
  Filled 2021-07-01 – 2021-07-11 (×2): qty 100, fill #0
  Filled 2021-07-12: qty 100, 25d supply, fill #0

## 2021-07-06 ENCOUNTER — Other Ambulatory Visit (HOSPITAL_COMMUNITY): Payer: Self-pay

## 2021-07-08 ENCOUNTER — Other Ambulatory Visit (HOSPITAL_COMMUNITY): Payer: Self-pay

## 2021-07-11 ENCOUNTER — Other Ambulatory Visit (HOSPITAL_COMMUNITY): Payer: Self-pay

## 2021-07-12 ENCOUNTER — Encounter: Payer: Self-pay | Admitting: Internal Medicine

## 2021-07-12 ENCOUNTER — Ambulatory Visit (INDEPENDENT_AMBULATORY_CARE_PROVIDER_SITE_OTHER): Payer: Self-pay | Admitting: Internal Medicine

## 2021-07-12 ENCOUNTER — Other Ambulatory Visit (HOSPITAL_COMMUNITY): Payer: Self-pay

## 2021-07-12 ENCOUNTER — Other Ambulatory Visit: Payer: Self-pay

## 2021-07-12 VITALS — BP 110/72 | HR 66 | Temp 98.2°F | Ht 76.0 in | Wt 204.2 lb

## 2021-07-12 DIAGNOSIS — E1110 Type 2 diabetes mellitus with ketoacidosis without coma: Secondary | ICD-10-CM

## 2021-07-12 DIAGNOSIS — E11649 Type 2 diabetes mellitus with hypoglycemia without coma: Secondary | ICD-10-CM

## 2021-07-12 LAB — POCT GLYCOSYLATED HEMOGLOBIN (HGB A1C): Hemoglobin A1C: 6.5 % — AB (ref 4.0–5.6)

## 2021-07-12 LAB — GLUCOSE, CAPILLARY: Glucose-Capillary: 157 mg/dL — ABNORMAL HIGH (ref 70–99)

## 2021-07-12 NOTE — Patient Instructions (Signed)
To Allen Russell,   It was a pleasure seeing you again! Today we discussed your diabetes medications. Because you continue to have hypoglycemic events, I am going to decrease your long acting insulin to 10 units daily. Please discontinue using the sliding scale insulin at this time. I am also going to reach out to social work to see if you can qualify for an insulin pump today. We will follow back in 2 weeks to reevaluate your sugar levels.  Have a good day,  Dolan Amen, MD

## 2021-07-13 ENCOUNTER — Telehealth: Payer: Self-pay | Admitting: *Deleted

## 2021-07-13 ENCOUNTER — Other Ambulatory Visit (HOSPITAL_COMMUNITY): Payer: Self-pay

## 2021-07-13 MED ORDER — LANTUS SOLOSTAR 100 UNIT/ML ~~LOC~~ SOPN
10.0000 [IU] | PEN_INJECTOR | Freq: Every day | SUBCUTANEOUS | 0 refills | Status: DC
  Filled 2021-07-13: qty 15, 150d supply, fill #0

## 2021-07-13 NOTE — Chronic Care Management (AMB) (Signed)
°  Care Management   Note  07/13/2021 Name: [name] MRN: [id number] DOB: 1960-09-11  Allen Russell is a 61 y.o. year old male who is a primary care patient of Amponsah, Flossie Buffy, MD. I reached out to Allen Russell by phone today in response to a referral sent by [name]'s primary care provider.   [name] was given information about care management services today including:  Care management services include personalized support from designated clinical staff supervised by his physician, including individualized plan of care and coordination with other care providers 24/7 contact phone numbers for assistance for urgent and routine care needs. The patient may stop care management services at any time by phone call to the office staff.  Patient agreed to services and verbal consent obtained.   Follow up plan: Telephone appointment with care management team member scheduled for:07/14/21  [facility] Guide, Embedded Care Coordination [facility]   Care Management  Direct Dial: [phone]

## 2021-07-13 NOTE — Progress Notes (Signed)
° °  CC: Diabetes Follow Up, Hypoglycemia  HPI:  [name] is a 61 y.o. person, with a PMH noted below, who presents to the clinic hypoglycemia and diabetes follow up. To see the management of their acute and chronic conditions, please see the A&P note under the Encounters tab.   Past Medical History:  Diagnosis Date   'light-for-dates' infant with signs of fetal malnutrition 04/12/2021   Adrenal insufficiency (HCC) 04/12/2021   Diabetes mellitus without complication (HCC)    Shock (HCC)    Review of Systems:   Review of Systems  Constitutional:  Positive for diaphoresis. Negative for chills, fever, malaise/fatigue and weight loss.  HENT:  Negative for ear discharge, ear pain, hearing loss and tinnitus.   Eyes:  Negative for blurred vision.  Respiratory:  Negative for cough and hemoptysis.   Cardiovascular:  Negative for chest pain, palpitations, orthopnea, claudication and leg swelling.  Gastrointestinal:  Negative for abdominal pain, constipation, diarrhea, nausea and vomiting.  Musculoskeletal:  Negative for myalgias.  Neurological:  Negative for dizziness and headaches.    Physical Exam:  Vitals:   07/12/21 1016  BP: 110/72  Pulse: 66  Temp: 98.2 F (36.8 C)  TempSrc: Oral  SpO2: 100%  Weight: 204 lb 3.2 oz (92.6 kg)  Height: 6\' 4"  (1.93 m)   Physical Exam Constitutional:      Appearance: Normal appearance.  HENT:     Head: Normocephalic and atraumatic.  Eyes:     General:        Right eye: No discharge.        Left eye: No discharge.     Conjunctiva/sclera: Conjunctivae normal.  Cardiovascular:     Rate and Rhythm: Normal rate and regular rhythm.     Pulses: Normal pulses.     Heart sounds: Normal heart sounds.  Pulmonary:     Effort: Pulmonary effort is normal.     Breath sounds: Normal breath sounds.  Abdominal:     General: Abdomen is flat. Bowel sounds are normal. There is no distension.     Palpations: Abdomen is soft.  Musculoskeletal:      Right lower leg: No edema.     Left lower leg: No edema.  Skin:    General: Skin is warm and dry.  Neurological:     Mental Status: He is alert and oriented to person, place, and time.  Psychiatric:        Mood and Affect: Mood normal.        Behavior: Behavior normal.     Assessment & Plan:   See Encounters Tab for problem based charting.  Patient discussed with Dr. 

## 2021-07-13 NOTE — Assessment & Plan Note (Addendum)
Allen Russell presents today for a follow up on his hypoglycemic events. He states that since decreasing his dose of insulin to 17U, he continues to have hypoglycemic events with feelings of shakiness, sweating, but has not had any falls.   He brings his GM with him today, he tests his sugars daily with an average of 2.1 test per day. He has a min reading of 31 and high of 254. He is above tartet 2% of the time, at target 59% of the time, and below target 39% of the time.   A/P: Patient presents for further management of his hypoglycemic events. We discussed that during his recent hospitalization, his physicians were concerned that he may have transitioned from type II to type I 2/2 to his chronic pancreatitis. He does take Creon three times daily, and [facility] issue [facility], so he has sufficient amylase to break down carbohydrates. He may have residual pancreatic function, which may be a contributing factor to his hypoglycemic events. Ultimately, I believe he would benefit from a CGM and insulin pump, but unfortunately he has no insurance at this time. I will reach out to social work to see if we can find additional resources to help [name]. In the meantime, we will decrease his long acting to 10 U of insulin and DC his sliding scale.  - Referral to CCM - Decrease Lantus to 10 units - Discontinue Sliding Scale - Continue Metformin

## 2021-07-14 ENCOUNTER — Ambulatory Visit: Payer: Medicaid Other | Admitting: Licensed Clinical Social Worker

## 2021-07-14 NOTE — Chronic Care Management (AMB) (Signed)
Social Work Note  07/14/2021 Name: Allen Russell MRN: [id number] DOB: 03/16/1961  Allen Russell is a 61 y.o. year old male who is a primary care patient of Amponsah, Flossie Buffy, MD.  The Care Management team was consulted for assistance with chronic disease management and care coordination needs.  [name] was given information about Care Management services today including:  Care Management services include personalized support from designated clinical staff supervised by his physician, including individualized plan of care and coordination with other care providers 24/7 contact phone numbers for assistance for urgent and routine care needs. The patient may stop care management services at any time (effective at the end of the month) by phone call to the office staff.  Patient agreed to services and consent obtained.   Engaged with patient by telephone for initial visit in response to provider referral for social work chronic care management and care coordination services.  Assessment: Review of patient past medical history, allergies, medications, and health status, including review of pertinent consultant reports was performed as part of comprehensive evaluation and provision of care management/care coordination services.   SDOH (Social Determinants of Health) assessments and interventions performed:  Yes SDOH Interventions    Flowsheet Row Most Recent Value  SDOH Interventions   Financial Strain Interventions Other (Comment)  [Patient will apply for the [facility] Card]        Advanced Directives Status: Not ready or willing to discuss.  Care Plan  Allergies  Allergen Reactions   Morphine Other (See Comments)    headache      Outpatient Encounter Medications as of 07/14/2021  Medication Sig   Blood Glucose Monitoring Suppl (TRUE METRIX METER) w/Device KIT Use up to four times daily as directed.   cyclobenzaprine (FLEXERIL) 10 MG tablet Take 1 tablet (10 mg total) by mouth  every 12 (twelve) hours as needed for muscle spasms.   diphenhydrAMINE (BENADRYL) 25 MG tablet Take 25 mg by mouth every 6 (six) hours as needed for allergies.   gabapentin (NEURONTIN) 300 MG capsule Take 1 capsule (300 mg total) by mouth 2 (two) times daily.   glucose blood test strip Use as directed to test blood sugar   insulin aspart (NOVOLOG FLEXPEN) 100 UNIT/ML FlexPen Inject 0-15 Units into the skin 3 (three) times daily with meals.   insulin glargine (LANTUS SOLOSTAR) 100 UNIT/ML Solostar Pen Inject 10 Units into the skin at bedtime.   Insulin Pen Needle 32G X 4 MM MISC Use 1 as directed in the morning, at noon, in the evening, and at bedtime (4 times a day) with insulin   lipase/protease/amylase (CREON) 36000 UNITS CPEP capsule Take 1 capsule (36,000 Units total) by mouth 3 (three) times daily with meals.   metFORMIN (GLUCOPHAGE) 1000 MG tablet Take 1 tablet (1,000 mg total) by mouth 2 (two) times daily with a meal.   Multiple Vitamin (MULTIVITAMIN) tablet Take 1 tablet by mouth daily.   ranitidine (ZANTAC) 150 MG tablet Take 150 mg by mouth daily as needed for heartburn.   TRUEplus Lancets 28G MISC use as directed to test blood sugar   No facility-administered encounter medications on file as of 07/14/2021.    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    Follow Up Plan: Patient is without health  insurance. Patient disability claim was denied. Patient will appeal decision. Patient was denied medicaid. If approved for disability, patient will be eligble for medicaid. [facility] will [facility]. SW sent application information to e-mail [email or web address]. Patient requested RNCM to outreach him regarding back pain and an eye exam at the clinic. Patient stated he is taking more tylenol then usual and he is concern his eye sight may be impacted by his diabetes. Patient requested pharmacist to outreach him regarding getting an  Insulin pump and CMG Device . SW , RNCM and pharmacist will follow up with patient within 30 days.       Milus Height, [place]  Social Worker [facility] Care Management  [phone]

## 2021-07-19 NOTE — Progress Notes (Signed)
Scheduled with RNCM 07/21/21  Thank you   [name]  Care Guide, Embedded Care Coordination [facility]   Care Management  Direct Dial: [phone]

## 2021-07-21 ENCOUNTER — Telehealth: Payer: Medicaid Other

## 2021-07-25 NOTE — Progress Notes (Signed)
Internal Medicine Clinic Attending ? ?Case discussed with Dr. Winters  At the time of the visit.  We reviewed the resident?s history and exam and pertinent patient test results.  I agree with the assessment, diagnosis, and plan of care documented in the resident?s note.  ?

## 2021-07-26 ENCOUNTER — Encounter: Payer: Medicaid Other | Admitting: Internal Medicine

## 2021-07-28 ENCOUNTER — Other Ambulatory Visit (HOSPITAL_COMMUNITY): Payer: Self-pay

## 2021-07-28 ENCOUNTER — Encounter: Payer: Self-pay | Admitting: Internal Medicine

## 2021-07-28 ENCOUNTER — Other Ambulatory Visit: Payer: Self-pay

## 2021-07-28 ENCOUNTER — Ambulatory Visit (INDEPENDENT_AMBULATORY_CARE_PROVIDER_SITE_OTHER): Payer: Self-pay | Admitting: Internal Medicine

## 2021-07-28 DIAGNOSIS — M47816 Spondylosis without myelopathy or radiculopathy, lumbar region: Secondary | ICD-10-CM

## 2021-07-28 DIAGNOSIS — G8929 Other chronic pain: Secondary | ICD-10-CM

## 2021-07-28 DIAGNOSIS — E119 Type 2 diabetes mellitus without complications: Secondary | ICD-10-CM

## 2021-07-28 DIAGNOSIS — M549 Dorsalgia, unspecified: Secondary | ICD-10-CM

## 2021-07-28 DIAGNOSIS — E1110 Type 2 diabetes mellitus with ketoacidosis without coma: Secondary | ICD-10-CM

## 2021-07-28 MED ORDER — LANTUS SOLOSTAR 100 UNIT/ML ~~LOC~~ SOPN
8.0000 [IU] | PEN_INJECTOR | Freq: Every day | SUBCUTANEOUS | 0 refills | Status: DC
  Filled 2021-07-28: qty 3, 28d supply, fill #0
  Filled 2021-08-21: qty 3, 28d supply, fill #1
  Filled 2021-09-14: qty 3, 28d supply, fill #2

## 2021-07-28 MED ORDER — DULOXETINE HCL 30 MG PO CPEP
30.0000 mg | ORAL_CAPSULE | Freq: Every day | ORAL | 2 refills | Status: DC
  Filled 2021-07-28: qty 30, 30d supply, fill #0
  Filled 2021-08-21: qty 30, 30d supply, fill #1
  Filled 2021-09-14: qty 30, 30d supply, fill #2

## 2021-07-28 NOTE — Patient Instructions (Signed)
To Mr. Dymek,   It was a pleasure seeing you again! Your blood sugar levels are much improved since your last visit two weeks ago. We are going to decrease your long acting insulin to 8 units a day to see if we can decrease the amount of hypoglycemic events you are having and have you follow up in 2 weeks. Additionally for your back pain, we are going to start a medication called duloxetine today, which is indicated for back pain. It is similar to tramadol, but does not have the sedating properties that tramadol can give patients. It will take time to build up in the system, about 4-6 weeks. Please take it daily, we will follow up with how you are doing at your next visit.  Have a good day,  Dolan Amen, MD

## 2021-07-29 ENCOUNTER — Ambulatory Visit: Payer: Medicaid Other

## 2021-07-29 NOTE — Chronic Care Management (AMB) (Signed)
Care Management    RN Visit Note  07/29/2021 Name: Allen Russell MRN: [id number] DOB: 08/23/1960  Subjective: Allen Russell is a 61 y.o. year old male who is a primary care patient of Amponsah, Charisse March, MD. The care management team was consulted for assistance with disease management and care coordination needs.    Engaged with patient by telephone for initial visit in response to provider referral for case management and/or care coordination services.   Consent to Services:   Allen Russell was given information about Care Management services today including:  Care Management services includes personalized support from designated clinical staff supervised by his physician, including individualized plan of care and coordination with other care providers 24/7 contact phone numbers for assistance for urgent and routine care needs. The patient may stop case management services at any time by phone call to the office staff.  Patient agreed to services and consent obtained.   Assessment: Review of patient past medical history, allergies, medications, health status, including review of consultants reports, laboratory and other test data, was performed as part of comprehensive evaluation and provision of chronic care management services.   SDOH (Social Determinants of Health) assessments and interventions performed:    Care Plan  Allergies  Allergen Reactions   Morphine Other (See Comments)    headache      Outpatient Encounter Medications as of 07/29/2021  Medication Sig   DULoxetine (CYMBALTA) 30 MG capsule Take 1 capsule (30 mg total) by mouth daily.   gabapentin (NEURONTIN) 300 MG capsule Take 1 capsule (300 mg total) by mouth 2 (two) times daily.   glucose blood test strip Use as directed to test blood sugar   insulin glargine (LANTUS SOLOSTAR) 100 UNIT/ML Solostar Pen Inject 8 Units into the skin at bedtime.   Insulin Pen Needle 32G X 4 MM MISC Use 1 as directed in the morning, at  noon, in the evening, and at bedtime (4 times a day) with insulin   lipase/protease/amylase (CREON) 36000 UNITS CPEP capsule Take 1 capsule (36,000 Units total) by mouth 3 (three) times daily with meals.   metFORMIN (GLUCOPHAGE) 1000 MG tablet Take 1 tablet (1,000 mg total) by mouth 2 (two) times daily with a meal.   TRUEplus Lancets 28G MISC use as directed to test blood sugar   Blood Glucose Monitoring Suppl (TRUE METRIX METER) w/Device KIT Use up to four times daily as directed.   cyclobenzaprine (FLEXERIL) 10 MG tablet Take 1 tablet (10 mg total) by mouth every 12 (twelve) hours as needed for muscle spasms.   diphenhydrAMINE (BENADRYL) 25 MG tablet Take 25 mg by mouth every 6 (six) hours as needed for allergies.   Multiple Vitamin (MULTIVITAMIN) tablet Take 1 tablet by mouth daily.   ranitidine (ZANTAC) 150 MG tablet Take 150 mg by mouth daily as needed for heartburn.   No facility-administered encounter medications on file as of 07/29/2021.    Patient Active Problem List   Diagnosis Date Noted   Pancreatic insufficiency 06/28/2021   Type 2 diabetes mellitus with ketoacidosis ([place]) 04/12/2021   Hypokalemia 04/12/2021   Dysautonomia ([place])    Ketoacidosis 03/21/2021   Postlaminectomy syndrome, lumbar region 09/11/2017   Spondylosis without myelopathy or radiculopathy, lumbar region 09/11/2017    Conditions to be addressed/monitored: DMII and Chronic Back Pain  Care Plan : RN Care Manager Plan of Care  Updates made by Johnney Killian, RN since 07/29/2021 12:00 AM     Problem: Health Promotion or Disease  Self-Management (General Plan of Care)      Long-Range Goal: Chronic Disease Management and Care Coordination Needs (DM2, Chronic Back Pain)   Start Date: 07/29/2021  This Visit's Progress: Not on track  Priority: High  Note:   Current Barriers: Successful outreach to patient this morning.    Patient explained his admission to acute in the fall and how ill he was.  He noted that  he has episodes of hypoglycemia in the morning and about 3 hours after he has dinner.  We discussed him possibly having a snack prior to bed to see if that makes any difference with his hypoglycemic events.  Patient shared his doctors plan is to get his blood sugars better under control and to get him a CGM.  We discussed patient meeting with Debera Lat, RD, Diabetic Educator, the same day he comes into the clinic, on 08/11/21.  Patient recently started a new job and it would be beneficial for him to have the appointment back to back.  Message sent to [facility] to see if she could add patient to her schedule.  Plan to follow up with patient when response received.  Knowledge Deficits related to plan of care for management of DMII and Chronic Back Pain  Chronic Disease Management support and education needs related to DMII and Chronic Back Pain  Financial Constraints  Difficulty obtaining medications  RNCM Clinical Goal(s):  Patient will verbalize basic understanding of  DMII and Chronic Back Pain disease process and self health management plan as evidenced by discussions with providers and RNCM. demonstrate understanding of rationale for each prescribed medication as evidenced by ongoing discussions/questions to providers. demonstrate Improved adherence to prescribed treatment plan for DMII and Chronic Back Pain as evidenced by less episodes of hypoglycemia and reduced back pain. continue to work with RN Care Manager to address care management and care coordination needs related to  DMII and Chronic back pain as evidenced by adherence to CM Team Scheduled appointments work with Education officer, museum to address  related to the management of Financial constraints related to lack of insurance, Medication procurement, and   related to the management of DMII and Chronic back pain as evidenced by review of EMR and patient or social worker report not experience hospital admission as evidenced by review of EMR.  Hospital Admissions in last 6 months = 1  through collaboration with RN Care manager, provider, and care team.   Interventions: 1:1 collaboration with primary care provider regarding development and update of comprehensive plan of care as evidenced by provider attestation and co-signature Inter-disciplinary care team collaboration (see longitudinal plan of care) Evaluation of current treatment plan related to  self management and patient's adherence to plan as established by provider   Diabetes Interventions:  (Status:  Goal on track:  NO.) Long Term Goal Assessed patient's understanding of A1c goal: <7% Provided education to patient about basic DM disease process Reviewed medications with patient and discussed importance of medication adherence Counseled on importance of regular laboratory monitoring as prescribed Discussed plans with patient for ongoing care management follow up and provided patient with direct contact information for care management team Reviewed scheduled/upcoming provider appointments including: 08/11/21@0845 . Lab Results  Component Value Date   HGBA1C 6.5 (A) 07/12/2021   Patient Goals/Self-Care Activities: Take all medications as prescribed Attend all scheduled provider appointments Call pharmacy for medication refills 3-7 days in advance of running out of medications Call provider office for new concerns or questions  Work with the social  worker to address care coordination needs and will continue to work with the clinical team to address health care and disease management related needs  Follow Up Plan:  The patient has been provided with contact information for the care management team and has been advised to call with any health related questions or concerns.      Plan: The patient has been provided with contact information for the care management team and has been advised to call with any health related questions or concerns.   Johnney Killian, RN, BSN,  CCM Care Management Coordinator [facility] Phone: [phone]: [phone]

## 2021-07-29 NOTE — Assessment & Plan Note (Addendum)
Patient presents with history of chronic back pain 2/2 to spondylosis and s/p L3-L4 spinal fusion in 2017 presenting to the clinic today with concerns for chronic back pain that has now worsened since starting a new job at [employer]. He has tried rotating tylenol and Motrin with little effect.  He states that Tramadol that was filled last April has helped with the pain, but he has recently run out. We discussed different medications that are indicated for chronic back pain. We discussed duloxetine as it does not have a sedating profile like tramadol. Patient is willing to trial Cymbalta. We discussed that it could take 4-6 weeks for the medication to take full effect, and Allen Russell voiced his understanding.  - Start Cymbalta 30 mg daily  - Continue Gabapentin 300 mg BID - Follow up in 4-6 weeks

## 2021-07-29 NOTE — Patient Instructions (Signed)
Visit Information  Thank you for taking time to visit with me today. Please don't hesitate to contact me if I can be of assistance to you before our next scheduled telephone appointment.  Our next appointment is by telephone on 08/26/21 at 0830  Please call the care guide team at [phone] if you need to cancel or reschedule your appointment.   If you are experiencing a Mental Health or Behavioral Health Crisis or need someone to talk to, please call the [place] National Suicide Prevention Lifeline: 718-633-5674 or TTY: (585)106-2097 TTY (575)335-4199) to talk to a trained counselor   Patient verbalizes understanding of instructions and care plan provided today and agrees to view in MyChart. Active MyChart status confirmed with patient.    The patient has been provided with contact information for the care management team and has been advised to call with any health related questions or concerns.   Jodelle Gross, RN, BSN, CCM Care Management Coordinator [facility] Phone: [phone]/Fax: [phone]

## 2021-07-29 NOTE — Assessment & Plan Note (Signed)
Patient presents to the clinic for follow up for his DM. His blood sugar levels are much more improved from his last 3 follow up visits. He reports no longer having experienced any hypoglycemic events, but on review of his blood sugars, he has had 5 events since his last visit with levels at 44-66. He no longer is on sliding scale insulin. He is continuing to work with SW to see if he can qualify for a continuous blood glucose monitor.   A/P: Patient presents with improvement in his hypoglycemic events. He is currently on 10U of insulin and his metformin. He has had 5 hypoglycemic readings into the 40s that appear to occur 2-3 hours after dinner, or in the morning. Will decrease his long acting to 8U and have him follow back with [place] in 2 weeks.  - Decrease Lantus to 8 units daily - Continue Metformin 500 mg twice daily - Follow up in 2 weeks

## 2021-07-29 NOTE — Progress Notes (Signed)
° °  CC: Diabetes and Chronic Back Pain  HPI:  [name] is a 61 y.o. person, with a PMH noted below, who presents to the clinic Diabetes and Chronic Back Pain. To see the management of their acute and chronic conditions, please see the A&P note under the Encounters tab.   Past Medical History:  Diagnosis Date   'light-for-dates' infant with signs of fetal malnutrition 04/12/2021   Adrenal insufficiency (HCC) 04/12/2021   Diabetes mellitus without complication (HCC)    Shock (HCC)    Review of Systems:   Review of Systems  Constitutional:  Negative for chills, fever, malaise/fatigue and weight loss.  Respiratory:  Negative for cough and hemoptysis.   Cardiovascular:  Negative for chest pain and palpitations.  Gastrointestinal:  Negative for abdominal pain, constipation, diarrhea, nausea and vomiting.  Neurological:  Negative for dizziness and headaches.    Physical Exam:  Vitals:   07/28/21 1511 07/28/21 1516  BP: 120/77   Pulse: (!) 59   Resp: (!) 24   Temp:  97.7 F (36.5 C)  TempSrc:  Oral  SpO2: 100%   Weight: 202 lb 11.2 oz (91.9 kg)   Height: 6\' 4"  (1.93 m)    Physical Exam Constitutional:      General: He is not in acute distress.    Appearance: Normal appearance. He is not ill-appearing, toxic-appearing or diaphoretic.  Cardiovascular:     Rate and Rhythm: Normal rate and regular rhythm.     Pulses: Normal pulses.     Heart sounds: Normal heart sounds. No murmur heard.   No friction rub. No gallop.  Pulmonary:     Effort: Pulmonary effort is normal. No respiratory distress.     Breath sounds: Normal breath sounds. No wheezing.  Abdominal:     General: Abdomen is flat. Bowel sounds are normal.     Palpations: Abdomen is soft.     Tenderness: There is no abdominal tenderness. There is no guarding.  Musculoskeletal:     Right lower leg: No edema.     Left lower leg: No edema.  Neurological:     Mental Status: He is alert and oriented to person, place,  and time.  Psychiatric:        Mood and Affect: Mood normal.        Behavior: Behavior normal.     Assessment & Plan:   See Encounters Tab for problem based charting.  Patient discussed with Dr. 

## 2021-08-01 ENCOUNTER — Telehealth: Payer: Self-pay | Admitting: Dietician

## 2021-08-01 NOTE — Telephone Encounter (Signed)
Called patient due to outreach by Jodelle Gross, Case manager. He could not talk at time of call but said he would call back later.

## 2021-08-01 NOTE — Progress Notes (Signed)
Internal Medicine Clinic Attending ? ?Case discussed with Dr. Winters  At the time of the visit.  We reviewed the resident?s history and exam and pertinent patient test results.  I agree with the assessment, diagnosis, and plan of care documented in the resident?s note.  ?

## 2021-08-02 NOTE — Progress Notes (Signed)
Received notification from [place] regarding approval for CREON. Patient assistance approved from 06/30/21 to 06/30/22.  Phone: [phone]

## 2021-08-10 ENCOUNTER — Other Ambulatory Visit: Payer: Self-pay | Admitting: Internal Medicine

## 2021-08-11 ENCOUNTER — Ambulatory Visit (INDEPENDENT_AMBULATORY_CARE_PROVIDER_SITE_OTHER): Payer: Self-pay | Admitting: Student

## 2021-08-11 ENCOUNTER — Encounter: Payer: Self-pay | Admitting: Student

## 2021-08-11 ENCOUNTER — Other Ambulatory Visit (HOSPITAL_COMMUNITY): Payer: Self-pay

## 2021-08-11 ENCOUNTER — Other Ambulatory Visit: Payer: Self-pay | Admitting: Internal Medicine

## 2021-08-11 ENCOUNTER — Other Ambulatory Visit: Payer: Self-pay

## 2021-08-11 DIAGNOSIS — E1110 Type 2 diabetes mellitus with ketoacidosis without coma: Secondary | ICD-10-CM

## 2021-08-11 MED ORDER — TRUE METRIX BLOOD GLUCOSE TEST VI STRP
ORAL_STRIP | 2 refills | Status: AC
  Filled 2021-08-11: qty 200, fill #0
  Filled 2021-08-22: qty 100, 25d supply, fill #0
  Filled 2021-09-14 – 2021-09-15 (×2): qty 100, 25d supply, fill #1
  Filled 2021-11-02 – 2021-11-04 (×2): qty 100, 25d supply, fill #2
  Filled 2021-12-11: qty 100, 25d supply, fill #3

## 2021-08-11 NOTE — Progress Notes (Signed)
° °  CC: Diabetes follow-up  HPI:  [name] is a 61 y.o. male with PMH as below who presents to clinic today to follow-up on his diabetes. Please see problem based charting for evaluation, assessment and plan.  Past Medical History:  Diagnosis Date   'light-for-dates' infant with signs of fetal malnutrition 04/12/2021   Adrenal insufficiency (HCC) 04/12/2021   Diabetes mellitus without complication (HCC)    Shock (HCC)     Review of Systems:  Constitutional: Negative for fatigue or weight changes Eyes: Negative for visual changes MSK: Positive for chronic low back pain Neuro: Positive for numbness in lower extremities.  Negative for weakness, headache or dizziness.  Physical Exam: General: Pleasant, well-appearing middle-age Caucasian male.  No acute distress. Cardiac: RRR. No murmurs, rubs or gallops. No LE edema Respiratory: Lungs CTAB. No wheezing or crackles. Skin: Warm, dry and intact without rashes or lesions Extremities: Atraumatic. Full ROM.  2+ radial, PT, DP pulses. Neuro: A&O x 3. Moves all extremities. Decreased sensation in the lower extremities.  Vitals:   08/11/21 0857  BP: 120/72  Pulse: 68  Temp: 97.8 F (36.6 C)  TempSrc: Oral  SpO2: 100%  Weight: 199 lb 14.4 oz (90.7 kg)  Height: 6\' 4"  (1.93 m)    Assessment & Plan:   See Encounters Tab for problem based charting.  Patient discussed with Dr. , MD, MPH

## 2021-08-11 NOTE — Assessment & Plan Note (Signed)
Patient's blood sugars have improved over the last 2 weeks after decreasing insulin to 8 units.  Milliradian show lowest of 77 and highest of 198.  Significant majority of his readings are within target.  Patient denies any dizziness, polyuria, polydipsia, headaches or blurry vision.  We will continue current regimen and repeat A1c in 2 months and hope of getting patient off insulin if A1c remains at goal.  Plan: -- Continue metformin 500 mg twice daily -- Continue Lantus 8 units daily -- Ophthalmology referral for diabetic eye exam once orange card has been approved -- Follow-up in 2 months for A1c check

## 2021-08-11 NOTE — Patient Instructions (Signed)
Thank you, [name] for allowing [place] to provide your care today. Today, we discussed your diabetes.  Your blood sugars have been doing well since the adjustments to your insulin. Continue the current dose and follow-up in 2 months for repeat A1c. We will take you off the insulin if your A1c remains below 7.  Please follow-up in 2 months  Please make sure to arrive 15 minutes prior to your next appointment. If you arrive late, you may be asked to reschedule.    We look forward to seeing you next time. Please call our clinic at [phone] if you have any questions or concerns. The best time to call is Monday-Friday from 9am-4pm, but there is someone available 24/7. If after hours or the weekend, call the main hospital number and ask for the Internal Medicine Resident On-Call. If you need medication refills, please notify your pharmacy one week in advance and they will send [place] a request.   Thank you for letting us take part in your care. Wishing you the best!  Steffanie Rainwater, MD 08/11/2021, 9:26 AM IM Resident, PGY-2 [name] 41:10

## 2021-08-12 NOTE — Progress Notes (Signed)
Internal Medicine Clinic Attending  Case discussed with Dr. Amponsah  At the time of the visit.  We reviewed the resident's history and exam and pertinent patient test results.  I agree with the assessment, diagnosis, and plan of care documented in the resident's note.  

## 2021-08-22 ENCOUNTER — Other Ambulatory Visit: Payer: Self-pay

## 2021-08-22 ENCOUNTER — Other Ambulatory Visit (HOSPITAL_COMMUNITY): Payer: Self-pay

## 2021-08-23 ENCOUNTER — Other Ambulatory Visit: Payer: Self-pay

## 2021-08-23 ENCOUNTER — Other Ambulatory Visit (HOSPITAL_COMMUNITY): Payer: Self-pay

## 2021-08-26 ENCOUNTER — Telehealth: Payer: Self-pay

## 2021-08-26 ENCOUNTER — Telehealth: Payer: Medicaid Other

## 2021-08-26 NOTE — Telephone Encounter (Signed)
°  Care Management   Outreach Note  08/26/2021 Name: ATANACIO DUFFORD MRN: [id number] DOB: 03-08-1961  Referred by: Lacinda Axon, MD Reason for referral : No chief complaint on file.   An unsuccessful telephone outreach was attempted today. The patient was referred to the case management team for assistance with care management and care coordination.   Follow Up Plan: If patient returns call to provider office, please advise to call [place] at [phone].  Johnney Killian, RN, BSN, CCM Care Management Coordinator [facility] Phone: [phone]: [phone]

## 2021-09-01 ENCOUNTER — Ambulatory Visit: Payer: Medicaid Other | Admitting: Licensed Clinical Social Worker

## 2021-09-01 NOTE — Chronic Care Management (AMB) (Signed)
?  Care Management  ? ?Social Work Visit Note ? ?09/01/2021 ?Name: MATTHEUS LUGG MRN: [id number] DOB: [birth year] ? ?LAIF KOHLHOFF is a 61 y.o. year old male who sees Amponsah, Charisse March, MD for primary care. The care management team was consulted for assistance with care management and care coordination needs related to [facility] Resources   ? ?Patient was given the following information about care management and care coordination services today, agreed to services, and gave verbal consent: 1.care management/care coordination services include personalized support from designated clinical staff supervised by their physician, including individualized plan of care and coordination with other care providers 2. 24/7 contact phone numbers for assistance for urgent and routine care needs. 3. The patient may stop care management/care coordination services at any time by phone call to the office staff. ? ?Engaged with patient by telephone for follow up visit in response to provider referral for social work chronic care management and care coordination services. ? ?Assessment: Review of patient history, allergies, and health status during evaluation of patient need for care management/care coordination services.   ? ?Interventions:  ?Patient interviewed and appropriate assessments performed ?Collaborated with clinical team regarding patient needs  ?Follow up call taken place on today. Patient is working on completing the [employer].  ? ? ?   ? ?Plan:  ?SW will follow up with patient within the next 90 days.  ?Patient has SW contact information if needed.  ? ?Milus Height, BSW  ?Social Worker ?[facility] Care Management  ?[phone] ?  ? ? ? ? ? ? ? ? ? ? ? ? ? ? ?

## 2021-09-01 NOTE — Patient Instructions (Signed)
Visit Information ? ?Instructions: patient will work with SW to address concerns related to [employer] application ? ?Patient was given the following information about care management and care coordination services today, agreed to services, and gave verbal consent: 1.care management/care coordination services include personalized support from designated clinical staff supervised by their physician, including individualized plan of care and coordination with other care providers 2. 24/7 contact phone numbers for assistance for urgent and routine care needs. 3. The patient may stop care management/care coordination services at any time by phone call to the office staff. ? ?Patient verbalizes understanding of instructions and care plan provided today and agrees to view in MyChart. Active MyChart status confirmed with patient.   ? ?The care management team will reach out to the patient again over the next 90 days.  ? ?Christen Butter, BSW  ?Social Worker ?[facility]  ?[phone] ?  ? ?  ?

## 2021-09-05 ENCOUNTER — Telehealth: Payer: Self-pay | Admitting: *Deleted

## 2021-09-05 NOTE — Chronic Care Management (AMB) (Signed)
?  Care Management  ? ?Note ? ?09/05/2021 ?Name: Allen Russell MRN: [id number] DOB: 25-Oct-1960 ? ?Allen Russell is a 61 y.o. year old male who is a primary care patient of Kirke Corin, Flossie Buffy, MD and is actively engaged with the care management team. I reached out to [name] by phone today to assist with re-scheduling a follow up visit with the RN Case Manager ? ?Follow up plan: ?Unsuccessful telephone outreach attempt made. A HIPAA compliant phone message was left for the patient providing contact information and requesting a return call.  ?The care management team will reach out to the patient again over the next 7 days.  ?If patient returns call to provider office, please advise to call Embedded Care Management Care Guide Misty Stanley at [phone]. ? ?[name]  ?Care Guide, Embedded Care Coordination ?  Care Management  ?Direct Dial: [phone] ? ?

## 2021-09-12 NOTE — Chronic Care Management (AMB) (Signed)
?  Care Management  ? ?Note ? ?09/12/2021 ?Name: Allen Russell MRN: [id number] DOB: 18-Jul-1960 ? ?Allen Russell is a 61 y.o. year old male who is a primary care patient of Kirke Corin, Flossie Buffy, MD and is actively engaged with the care management team. I reached out to [name] by phone today to assist with re-scheduling a follow up visit with the RN Case Manager ? ?Follow up plan: ?Telephone appointment with care management team member scheduled for:09/13/21 ? ?[name]  ?Care Guide, Embedded Care Coordination ?[place]  Care Management  ?Direct Dial: [phone] ? ?

## 2021-09-13 ENCOUNTER — Ambulatory Visit: Payer: Medicaid Other

## 2021-09-13 NOTE — Patient Instructions (Signed)
Visit Information ? ?Thank you for taking time to visit with me today. Please don't hesitate to contact me if I can be of assistance to you before our next scheduled telephone appointment. ? ?Our next appointment is by telephone on 10/25/21 at 12:30 ? ?Please call the care guide team at [phone] if you need to cancel or reschedule your appointment.  ? ?If you are experiencing a Mental Health or Behavioral Health Crisis or need someone to talk to, please call the [place] National Suicide Prevention Lifeline: 302-463-0660 or TTY: 865-013-2886 TTY 217-344-8805) to talk to a trained counselor  ? ?Patient verbalizes understanding of instructions and care plan provided today and agrees to view in MyChart. Active MyChart status confirmed with patient.   ? ?The patient has been provided with contact information for the care management team and has been advised to call with any health related questions or concerns.  ? ?Jodelle Gross, RN, BSN, CCM ?Care Management Coordinator ?[facility] ?Phone: [phone]/Fax: [phone]  ?

## 2021-09-13 NOTE — Chronic Care Management (AMB) (Signed)
? Care Management ?  ? RN Visit Note ? ?09/13/2021 ?Name: Allen Russell MRN: [id number] DOB: Aug 08, 1960 ? ?Subjective: ?Allen Russell is a 61 y.o. year old male who is a primary care patient of Amponsah, Charisse March, MD. The care management team was consulted for assistance with disease management and care coordination needs.   ? ?Engaged with patient by telephone for follow up visit in response to provider referral for case management and/or care coordination services.  ? ?Consent to Services:  ? Allen Russell was given information about Care Management services today including:  ?Care Management services includes personalized support from designated clinical staff supervised by his physician, including individualized plan of care and coordination with other care providers ?24/7 contact phone numbers for assistance for urgent and routine care needs. ?The patient may stop case management services at any time by phone call to the office staff. ? ?Patient agreed to services and consent obtained.  ? ?Assessment: Review of patient past medical history, allergies, medications, health status, including review of consultants reports, laboratory and other test data, was performed as part of comprehensive evaluation and provision of chronic care management services.  ? ?SDOH (Social Determinants of Health) assessments and interventions performed:   ? ?Care Plan ? ?Allergies  ?Allergen Reactions  ? Morphine Other (See Comments)  ?  headache ? ?  ? ? ?Outpatient Encounter Medications as of 09/13/2021  ?Medication Sig  ? Blood Glucose Monitoring Suppl (TRUE METRIX METER) w/Device KIT Use up to four times daily as directed.  ? cyclobenzaprine (FLEXERIL) 10 MG tablet Take 1 tablet (10 mg total) by mouth every 12 (twelve) hours as needed for muscle spasms.  ? diphenhydrAMINE (BENADRYL) 25 MG tablet Take 25 mg by mouth every 6 (six) hours as needed for allergies.  ? DULoxetine (CYMBALTA) 30 MG capsule Take 1 capsule (30 mg total) by mouth  daily.  ? gabapentin (NEURONTIN) 300 MG capsule Take 1 capsule (300 mg total) by mouth 2 (two) times daily.  ? glucose blood (TRUE METRIX BLOOD GLUCOSE TEST) test strip Use as directed to test blood sugar  ? insulin glargine (LANTUS SOLOSTAR) 100 UNIT/ML Solostar Pen Inject 8 Units into the skin at bedtime.  ? Insulin Pen Needle 32G X 4 MM MISC Use 1 as directed in the morning, at noon, in the evening, and at bedtime (4 times a day) with insulin  ? lipase/protease/amylase (CREON) 36000 UNITS CPEP capsule Take 1 capsule (36,000 Units total) by mouth 3 (three) times daily with meals.  ? metFORMIN (GLUCOPHAGE) 1000 MG tablet Take 1 tablet (1,000 mg total) by mouth 2 (two) times daily with a meal.  ? Multiple Vitamin (MULTIVITAMIN) tablet Take 1 tablet by mouth daily.  ? ranitidine (ZANTAC) 150 MG tablet Take 150 mg by mouth daily as needed for heartburn.  ? TRUEplus Lancets 28G MISC use as directed to test blood sugar  ? ?No facility-administered encounter medications on file as of 09/13/2021.  ? ? ?Patient Active Problem List  ? Diagnosis Date Noted  ? Pancreatic insufficiency 06/28/2021  ? Type 2 diabetes mellitus with ketoacidosis ([place]) 04/12/2021  ? Hypokalemia 04/12/2021  ? Dysautonomia ([place])   ? Ketoacidosis 03/21/2021  ? Postlaminectomy syndrome, lumbar region 09/11/2017  ? 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 ? ?Conditions to be addressed/monitored: DMII and Chronic back pain ? ?Care Plan : RN Care Manager Plan of Care  ?Updates made by Allen Killian, RN since 09/13/2021 12:00 AM  ?  ? ?  Problem: Health Promotion or Disease Self-Management (General Plan of Care)   ?  ? ?Long-Range Goal: Chronic Disease Management and Care Coordination Needs (DM2, Chronic Back Pain)   ?Start Date: 07/29/2021  ?Recent Progress: Not on track  ?Priority: High  ?Note:   ?Current Barriers: Successful outreach to patient this afternoon.  Discussed how patient is doing with the Cymbalta he started  on 07/28/21.  He shared that he feels it is helping with his pain but he is having side effects of leg cramping and decreased sexual drive.  Patient asked if there was another medication he could try and I informed him that I would send a message to Allen Russell and see if he has any recommendations.  We discussed patients diabetes and CBG readings.  He noted he is taking 8u long acting insulin at HS and recently he has only had one or 2 episodes of readings being in the 140's.  His lows were in the 60's and he shared he does not get symptoms unless he is in the 50's. ?Patient has not filled out application for [facility] card but he stated he plans on doing it today after work.  Patient started a new job recently and he is standing for 9 hours a day. ?Knowledge Deficits related to plan of care for management of DMII and Chronic Back Pain  ?Chronic Disease Management support and education needs related to DMII and Chronic Back Pain  ?Financial Constraints  ?Difficulty obtaining medications ?RNCM Clinical Goal(s):  ?Patient will verbalize basic understanding of  DMII and Chronic Back Pain disease process and self health management plan as evidenced by discussions with providers and RNCM. ?demonstrate understanding of rationale for each prescribed medication as evidenced by ongoing discussions/questions to providers. ?demonstrate Improved adherence to prescribed treatment plan for DMII and Chronic Back Pain as evidenced by less episodes of hypoglycemia and reduced back pain. ?continue to work with RN Care Manager to address care management and care coordination needs related to  DMII and Chronic back pain as evidenced by adherence to CM Team Scheduled appointments ?work with Education officer, museum to address  related to the management of Financial constraints related to lack of insurance, Medication procurement, and   related to the management of DMII and Chronic back pain as evidenced by review of EMR and patient or social worker  report ?not experience hospital admission as evidenced by review of EMR. Hospital Admissions in last 6 months = 1  through collaboration with RN Care manager, provider, and care team.  ?Interventions: ?1:1 collaboration with primary care provider regarding development and update of comprehensive plan of care as evidenced by provider attestation and co-signature ?Inter-disciplinary care team collaboration (see longitudinal plan of care) ?Evaluation of current treatment plan related to  self management and patient's adherence to plan as established by provider ?Diabetes Interventions:  (Status:  Goal on track:  NO.) Long Term Goal  ?Assessed patient's understanding of A1c goal: <7% ?Provided education to patient about basic DM disease process ?Reviewed medications with patient and discussed importance of medication adherence ?Counseled on importance of regular laboratory monitoring as prescribed ?Discussed plans with patient for ongoing care management follow up and provided patient with direct contact information for care management team ?Reviewed scheduled/upcoming provider appointments including: 08/11/21@0845 . ?Lab Results  ?Component Value Date  ? HGBA1C 6.5 (A) 07/12/2021  ?Patient Goals/Self-Care Activities: ?Take all medications as prescribed ?Attend all scheduled provider appointments ?Call pharmacy for medication refills 3-7 days in advance of running out of medications ?  Call provider office for new concerns or questions  ?Work with the Education officer, museum to address care coordination needs and will continue to work with the clinical team to address health care and disease management related needs ? ?Follow Up Plan:  The patient has been provided with contact information for the care management team and has been advised to call with any health related questions or concerns.   ?  ? ? ?Plan: The patient has been provided with contact information for the care management team and has been advised to call with any health  related questions or concerns.  ? ?Allen Killian, RN, BSN, CCM ?Care Management Coordinator ?[facility] ?Phone: [phone]/YSA: [phone]  ? ? ? ? ? ? ? ? ? ?

## 2021-09-15 ENCOUNTER — Other Ambulatory Visit: Payer: Self-pay

## 2021-09-15 ENCOUNTER — Other Ambulatory Visit (HOSPITAL_BASED_OUTPATIENT_CLINIC_OR_DEPARTMENT_OTHER): Payer: Self-pay

## 2021-09-15 ENCOUNTER — Other Ambulatory Visit: Payer: Self-pay | Admitting: Student

## 2021-09-15 ENCOUNTER — Other Ambulatory Visit (HOSPITAL_COMMUNITY): Payer: Self-pay

## 2021-09-16 MED ORDER — PANCRELIPASE (LIP-PROT-AMYL) 36000-114000 UNITS PO CPEP
36000.0000 [IU] | ORAL_CAPSULE | Freq: Three times a day (TID) | ORAL | 5 refills | Status: AC
  Filled 2021-09-16 – 2021-09-22 (×2): qty 100, 34d supply, fill #0

## 2021-09-19 ENCOUNTER — Other Ambulatory Visit: Payer: Self-pay

## 2021-09-19 ENCOUNTER — Other Ambulatory Visit: Payer: Self-pay | Admitting: Student

## 2021-09-19 ENCOUNTER — Telehealth: Payer: Self-pay

## 2021-09-19 ENCOUNTER — Other Ambulatory Visit (HOSPITAL_COMMUNITY): Payer: Self-pay

## 2021-09-19 MED ORDER — PREGABALIN 50 MG PO CAPS
ORAL_CAPSULE | ORAL | 0 refills | Status: DC
  Filled 2021-09-19: qty 42, 28d supply, fill #0

## 2021-09-19 NOTE — Telephone Encounter (Signed)
Due to cost, [facility] is no longer dispensing Creon to patient. Patient is enrolled in the [place] [employer] patient assistance program until 06/30/22 and the medication is shipped to the patient at no cost to pt.  Phone number for My [facility] is [phone] for refills or questions. ?

## 2021-09-19 NOTE — Progress Notes (Signed)
During a Care Management call from RN Electa Sniff, patient informed her of decreased sexual drive and leg cramping from the Cymbalta. RN Electa Sniff reached out to me on his behalf and I called patient to discuss his symptoms and alternatives. Patient reported that the Cymbalta was helping somewhat with his pain but these side effects have been progressive since he started the medication. I discussed with the patient about trying an alternative neuropathic pain agent such as Lyrica and patient okay to try it. Patient was informed of plans to start Lyrica at a lower dose and titrate up based on tolerability until he follows up with me on  April 11th.  ?

## 2021-09-22 ENCOUNTER — Other Ambulatory Visit: Payer: Self-pay

## 2021-09-23 ENCOUNTER — Other Ambulatory Visit: Payer: Self-pay

## 2021-10-11 ENCOUNTER — Other Ambulatory Visit (HOSPITAL_COMMUNITY): Payer: Self-pay

## 2021-10-11 ENCOUNTER — Encounter: Payer: Self-pay | Admitting: Student

## 2021-10-11 ENCOUNTER — Ambulatory Visit (INDEPENDENT_AMBULATORY_CARE_PROVIDER_SITE_OTHER): Payer: Self-pay | Admitting: Student

## 2021-10-11 VITALS — BP 113/74 | HR 84 | Temp 98.9°F | Ht 76.0 in | Wt 193.5 lb

## 2021-10-11 DIAGNOSIS — E1110 Type 2 diabetes mellitus with ketoacidosis without coma: Secondary | ICD-10-CM

## 2021-10-11 DIAGNOSIS — R252 Cramp and spasm: Secondary | ICD-10-CM

## 2021-10-11 DIAGNOSIS — M47816 Spondylosis without myelopathy or radiculopathy, lumbar region: Secondary | ICD-10-CM

## 2021-10-11 DIAGNOSIS — E876 Hypokalemia: Secondary | ICD-10-CM

## 2021-10-11 DIAGNOSIS — N179 Acute kidney failure, unspecified: Secondary | ICD-10-CM

## 2021-10-11 LAB — POCT GLYCOSYLATED HEMOGLOBIN (HGB A1C): Hemoglobin A1C: 5.8 % — AB (ref 4.0–5.6)

## 2021-10-11 LAB — GLUCOSE, CAPILLARY: Glucose-Capillary: 114 mg/dL — ABNORMAL HIGH (ref 70–99)

## 2021-10-11 MED ORDER — PREGABALIN 50 MG PO CAPS
50.0000 mg | ORAL_CAPSULE | Freq: Two times a day (BID) | ORAL | 3 refills | Status: DC
  Filled 2021-10-11 – 2021-10-20 (×3): qty 60, 30d supply, fill #0
  Filled 2021-11-15: qty 60, 30d supply, fill #1
  Filled 2021-11-16: qty 60, 30d supply, fill #0
  Filled 2021-12-11 – 2022-01-16 (×3): qty 60, 30d supply, fill #1

## 2021-10-11 MED ORDER — GABAPENTIN 300 MG PO CAPS
300.0000 mg | ORAL_CAPSULE | Freq: Every day | ORAL | 1 refills | Status: DC
  Filled 2021-10-11 – 2021-10-16 (×2): qty 30, 30d supply, fill #0
  Filled 2021-11-15: qty 30, 30d supply, fill #1
  Filled 2021-11-16: qty 30, 30d supply, fill #0
  Filled 2021-12-11: qty 30, 30d supply, fill #1
  Filled 2022-01-16: qty 30, 30d supply, fill #2
  Filled 2022-02-16: qty 30, 30d supply, fill #3
  Filled 2022-03-20: qty 30, 30d supply, fill #4

## 2021-10-11 NOTE — Progress Notes (Signed)
? ?  CC: Diabetes follow-up ? ?HPI: ? ?Mr.MARKEITH JUE is a 61 y.o. male with PMH as below who presents to clinic to follow-up on his diabetes. Please see problem based charting for evaluation, assessment and plan. ? ?Past Medical History:  ?Diagnosis Date  ? 'light-for-dates' infant with signs of fetal malnutrition 04/12/2021  ? Adrenal insufficiency (HCC) 04/12/2021  ? Diabetes mellitus without complication (HCC)   ? Shock (HCC)   ? ? ?Review of Systems:  ?Constitutional: Negative for polydipsia, diaphoresis, fever or fatigue ?Eyes: Negative for visual changes.  ?MSK: Positive for chronic back pain. Positive for occasional leg cramps. ?Abdomen: Negative for abdominal pain, constipation or diarrhea ?GU: Negative for dysuria ?Neuro: Negative for headache or weakness.  Positive for occasional numbness and tingling. ? ?Physical Exam: ?General: Pleasant, well-appearing middle-age man. No acute distress. ?Cardiac: RRR. No murmurs, rubs or gallops. No LE edema ?Respiratory: Lungs CTAB. No wheezing or crackles. ?Abdominal: Soft, symmetric and non tender. Normal BS. ?Skin: Warm, dry and intact without rashes or lesions ?Extremities: Atraumatic. Full ROM. Palpable radial and DP pulses. ?Neuro: A&O x 3. Moves all extremities.  ?Psych: Appropriate mood and affect. ? ?Vitals:  ? 10/11/21 1510  ?BP: 113/74  ?Pulse: 84  ?Temp: 98.9 ?F (37.2 ?C)  ?TempSrc: Oral  ?SpO2: 99%  ?Weight: 193 lb 8 oz (87.8 kg)  ?Height: 6\' 4"  (1.93 m)  ? ? ?Assessment & Plan:  ? ?See Encounters Tab for problem based charting. ? ?Patient discussed with Dr.  ? ?Mayford Knife, MD, MPH ? ?

## 2021-10-11 NOTE — Assessment & Plan Note (Addendum)
Patient here for follow-up on his chronic back and neuropathic pain. Patient was trialed on Cymbalta 2.5 months ago but started having worsening leg cramps as well as sexual dysfunction 6 to 8 weeks after starting the medication. I discontinued Cymbalta and started patient on Lyrica with escalating dose from 50 mg daily for 2 weeks then 50 mg twice daily for 2 weeks. Patient here to follow-up for reevaluation. Patient reports since starting the Lyrica, his neuropathic pain has significantly improved from 7 on the pain scale to about 3-4 which has been tolerable. Also reports his leg cramps have improved and his sexual dysfunction has completely resolved. He denies any drowsiness, dizziness, blurry vision, fatigue, dry mouth or difficulty with concentration. Patient currently on both Lyrica and gabapentin for neuropathic pain. Plan to slowly wean off gabapentin and increase Lyrica as needed for pain control.  ? ?Plan: ?-- Refill Lyrica 50 mg twice daily ?-- Decrease gabapentin from 300 mg twice daily to 300 mg daily. Advised to call clinic if neuropathic pain worsens on this current regimen so Lyrica dose can be titrated up. ?-- Follow-up in 2 to 3 months. ?

## 2021-10-11 NOTE — Assessment & Plan Note (Addendum)
Patient on Lantus 8 units daily at bedtime and metformin 1000 mg twice daily here for diabetes follow-up. He continues to have neuropathic pain that has improved with Lyrica but denies any polyuria, polydipsia, blurry vision, headaches or diaphoresis. Patient's meter today shows patient is within target 76.4%, above target 9.1% below target 14.5%. Patient reports he has made changes to his diet with the hope of getting off insulin. Patient's A1c improved from 6.5% 3 months ago to 5.8% today. Due to significant improvement in A1c from 11.4% 6 months ago to 5.8% today as well as patient having ~15% of blood sugar below target range, I will take patient off insulin and monitor A1c closely. ? ?Plan: ?-- Discontinue Lantus 8 units daily at bedtime ?-- Continue metformin 1000 units twice daily ?-- Continue lifestyle modification with exercise and decreasing sugar intake ?-- A1c check in 3 months ?-- Ophthalmology referral for eye exam when patient's orange card is approved.  ? ?

## 2021-10-11 NOTE — Patient Instructions (Addendum)
Thank you, [name] for allowing [place] to provide your care today. Today, we discussed your diabetes, leg cramping and back pain. I am glad the Lyrica is helping with your back pain and neuropathy. Continues to take the Lyrica and decrease your gabapentin to once daily.  Your A1c is now very well controlled so I am stopping your Lantus. Continue to decrease your amount of sugar intake and exercise daily to help maintain the A1c below 6%. For your leg cramps, I am checking some labs to make sure it is not caused by electrolyte abnormalities.  ? ?I have ordered the following labs for you: ? ? ?Lab Orders    ?     Glucose, capillary    ?     Magnesium    ?     BMP8+Anion Gap    ?     POC Hbg A1C     ? ?I will call if any are abnormal. All of your labs can be accessed through "My Chart". ? ?I have ordered the following medication/changed the following medications:  ?Continue Lyrica 50 mg twice daily ?Decrease gabapentin to 300 mg daily ?Disccontinue Lantus ? ?My Chart Access: ?[email or web address] ? ?Please follow-up in 2 months ? ?Please make sure to arrive 15 minutes prior to your next appointment. If you arrive late, you may be asked to reschedule.  ?  ?We look forward to seeing you next time. Please call our clinic at [phone] if you have any questions or concerns. The best time to call is Monday-Friday from 9am-4pm, but there is someone available 24/7. If after hours or the weekend, call the main hospital number and ask for the Internal Medicine Resident On-Call. If you need medication refills, please notify your pharmacy one week in advance and they will send [place] a request. ?  ?Thank you for letting us take part in your care. Wishing you the best! ? ?Steffanie Rainwater, MD ?10/11/2021, 3:58 PM ?IM Resident, PGY-2 ?Isaiah 41:10 ? ?

## 2021-10-11 NOTE — Assessment & Plan Note (Addendum)
Patient reports intermittent leg cramps that started during hospitalization for DKA in September 2022 when his potassium was low at 2.9.  His leg cramps improved with repletion of his potassium.  Patient reports that after starting the Cymbalta for his chronic back pain, he noticed increased leg cramps. He also noticed that his leg cramps decrease in frequency after the Cymbalta was discontinued last month. He continues to have intermittent leg cramps but they have been less frequent than before. He is currently on multivitamins and has started taking over-the-counter magnesium supplements. Due to his history of electrolyte abnormalities, will check a BMP and mag to assess for possible hypocalcemia, hypokalemia and hypomagnesemia.  ? ?Plan: ?-- Check BMP, magnesium ?-- Patient advised to stay hydrated and occasional drink electrolyte-rich fluids such as Gatorade as patient states he does not like bananas. ? ?Addendum: ?BMP shows normal potassium and calcium. Magnesium within normal levels. Patient instructed to stop taking OTC magnesium pills. BMP did show new AKI (See AKI under plan) ?

## 2021-10-12 LAB — BMP8+ANION GAP
Anion Gap: 18 mmol/L (ref 10.0–18.0)
BUN/Creatinine Ratio: 16 (ref 9–20)
BUN: 38 mg/dL — ABNORMAL HIGH (ref 6–24)
CO2: 22 mmol/L (ref 20–29)
Calcium: 9.8 mg/dL (ref 8.7–10.2)
Chloride: 102 mmol/L (ref 96–106)
Creatinine, Ser: 2.36 mg/dL — ABNORMAL HIGH (ref 0.76–1.27)
Glucose: 85 mg/dL (ref 70–99)
Potassium: 4.9 mmol/L (ref 3.5–5.2)
Sodium: 142 mmol/L (ref 134–144)
eGFR: 31 mL/min/{1.73_m2} — ABNORMAL LOW (ref >59)

## 2021-10-12 LAB — MAGNESIUM: Magnesium: 2.3 mg/dL (ref 1.6–2.3)

## 2021-10-12 NOTE — Progress Notes (Signed)
Internal Medicine Clinic Attending  Case discussed with Dr. Amponsah  At the time of the visit.  We reviewed the resident's history and exam and pertinent patient test results.  I agree with the assessment, diagnosis, and plan of care documented in the resident's note.  

## 2021-10-15 DIAGNOSIS — N189 Chronic kidney disease, unspecified: Secondary | ICD-10-CM | POA: Insufficient documentation

## 2021-10-15 DIAGNOSIS — N179 Acute kidney failure, unspecified: Secondary | ICD-10-CM | POA: Insufficient documentation

## 2021-10-15 NOTE — Assessment & Plan Note (Addendum)
BMP during encounter on 4/11 showed a significant bump in BUN/sCr from 10/0.84 five  months ago to 38/2.36, GFR from 102 to 31. Patient informed of lab results and asked for further history. Patient states he always stays hydrated and has a bottle of water with him most of the time. Since October, the only change in medications are the cymbalta (has been discontinued) Lyrica and OTC magnesium and B-complex. He had decreased urine output while on the Cymbalta but this has resolved after it was discontinued. He also reports taking Motrin about once a week for worsening back pain. The BUN/sCr ratio of 16 points to an intrinsic etiology like an ATN. Patient will need repeat labs and further work-up of this new AKI. Patient informed of plan to have him come in for lab work and further evaluation of his AKI. ? ?Plan:  ?--Follow up visit this week for AKI evaluation. Please check CMP (ensure LFTs are also okay), UA, MA/CR, CBC (ensure anemia is stable). If no improvement in AKI, consider renal U/S. There were concern for malignancy during his recent hospitalization but work up was negative. Keep multiple myeloma on the differential.  ?--Advised to avoid NSAIDs and stay hydrated at all times.  ? ?

## 2021-10-17 ENCOUNTER — Other Ambulatory Visit (HOSPITAL_COMMUNITY): Payer: Self-pay

## 2021-10-19 ENCOUNTER — Ambulatory Visit (INDEPENDENT_AMBULATORY_CARE_PROVIDER_SITE_OTHER): Payer: Self-pay | Admitting: Internal Medicine

## 2021-10-19 VITALS — BP 121/68 | HR 64 | Temp 98.5°F | Ht 76.0 in | Wt 198.7 lb

## 2021-10-19 DIAGNOSIS — N179 Acute kidney failure, unspecified: Secondary | ICD-10-CM

## 2021-10-19 DIAGNOSIS — D509 Iron deficiency anemia, unspecified: Secondary | ICD-10-CM

## 2021-10-19 NOTE — Patient Instructions (Signed)
Thank you, [name] for allowing [place] to provide your care today. Today we discussed kidney injury.   ? ?Labs/Tests Ordered: ? ?Lab Orders    ?     CMP14 + Anion Gap    ?     Urinalysis, Complete (81001)    ?     CBC no Diff    ?     Iron, TIBC and Ferritin Panel     ? ?Referrals Ordered:  ?Referral Orders  ?No referral(s) requested today  ?  ? ?Medication Changes:  ?There are no discontinued medications.  ? ?No orders of the defined types were placed in this encounter. ?  ? ?Health Maintenance Screening: ?Diabetes Health Maintenance Due  ?Topic Date Due  ? OPHTHALMOLOGY EXAM  Never done  ? URINE MICROALBUMIN  04/11/2022  ? HEMOGLOBIN A1C  04/12/2022  ? FOOT EXAM  08/11/2022  ?  ? ?Instructions:  ? ?Follow up:  depending on lab results.    ? ?Remember: If you have any questions or concerns, call our clinic at [phone] or after hours call [phone] and ask for the internal medicine resident on call. ? ?Dellia Cloud, D.O. ?[facility] ? ? ? ?

## 2021-10-20 ENCOUNTER — Other Ambulatory Visit: Payer: Self-pay

## 2021-10-20 ENCOUNTER — Other Ambulatory Visit (HOSPITAL_COMMUNITY): Payer: Self-pay

## 2021-10-20 LAB — CMP14 + ANION GAP
ALT: 31 IU/L (ref 0–44)
AST: 27 IU/L (ref 0–40)
Albumin/Globulin Ratio: 2.3 — ABNORMAL HIGH (ref 1.2–2.2)
Albumin: 4.6 g/dL (ref 3.8–4.9)
Alkaline Phosphatase: 56 IU/L (ref 44–121)
Anion Gap: 15 mmol/L (ref 10.0–18.0)
BUN/Creatinine Ratio: 16 (ref 9–20)
BUN: 36 mg/dL — ABNORMAL HIGH (ref 6–24)
Bilirubin Total: 0.3 mg/dL (ref 0.0–1.2)
CO2: 24 mmol/L (ref 20–29)
Calcium: 9.3 mg/dL (ref 8.7–10.2)
Chloride: 102 mmol/L (ref 96–106)
Creatinine, Ser: 2.21 mg/dL — ABNORMAL HIGH (ref 0.76–1.27)
Globulin, Total: 2 g/dL (ref 1.5–4.5)
Glucose: 94 mg/dL (ref 70–99)
Potassium: 4.4 mmol/L (ref 3.5–5.2)
Sodium: 141 mmol/L (ref 134–144)
Total Protein: 6.6 g/dL (ref 6.0–8.5)
eGFR: 34 mL/min/{1.73_m2} — ABNORMAL LOW (ref >59)

## 2021-10-20 LAB — CBC
Hematocrit: 32.7 % — ABNORMAL LOW (ref 37.5–51.0)
Hemoglobin: 10.6 g/dL — ABNORMAL LOW (ref 13.0–17.7)
MCH: 28.6 pg (ref 26.6–33.0)
MCHC: 32.4 g/dL (ref 31.5–35.7)
MCV: 88 fL (ref 79–97)
Platelets: 220 10*3/uL (ref 150–450)
RBC: 3.71 x10E6/uL — ABNORMAL LOW (ref 4.14–5.80)
RDW: 13.5 % (ref 11.6–15.4)
WBC: 6.3 10*3/uL (ref 3.4–10.8)

## 2021-10-20 LAB — URINALYSIS, COMPLETE
Bilirubin, UA: NEGATIVE
Glucose, UA: NEGATIVE
Ketones, UA: NEGATIVE
Leukocytes,UA: NEGATIVE
Nitrite, UA: NEGATIVE
Protein,UA: NEGATIVE
RBC, UA: NEGATIVE
Specific Gravity, UA: 1.005 (ref 1.005–1.030)
Urobilinogen, Ur: 0.2 mg/dL (ref 0.2–1.0)
pH, UA: 6 (ref 5.0–7.5)

## 2021-10-20 LAB — IRON,TIBC AND FERRITIN PANEL
Ferritin: 189 ng/mL (ref 30–400)
Iron Saturation: 26 % (ref 15–55)
Iron: 69 ug/dL (ref 38–169)
Total Iron Binding Capacity: 266 ug/dL (ref 250–450)
UIBC: 197 ug/dL (ref 111–343)

## 2021-10-20 LAB — MICROSCOPIC EXAMINATION
Bacteria, UA: NONE SEEN
Casts: NONE SEEN /lpf
Epithelial Cells (non renal): NONE SEEN /hpf (ref 0–10)
RBC, Urine: NONE SEEN /hpf (ref 0–2)
WBC, UA: NONE SEEN /hpf (ref 0–5)

## 2021-10-20 NOTE — Progress Notes (Signed)
? ? ?Subjective:  ?CC: AKI/CKD ? ?HPI: ? ?Allen Russell is a 61 y.o. male with a past medical history stated below and presents today for AKI/CKD. Please see problem based assessment and plan for additional details. ? ?Past Medical History:  ?Diagnosis Date  ? 'light-for-dates' infant with signs of fetal malnutrition 04/12/2021  ? Adrenal insufficiency ([place]) 04/12/2021  ? Diabetes mellitus without complication ([place])   ? Ketoacidosis 03/21/2021  ? Shock ([place])   ? ?Current Outpatient Medications on File Prior to Visit  ?Medication Sig Dispense Refill  ? Blood Glucose Monitoring Suppl (TRUE METRIX METER) w/Device KIT Use up to four times daily as directed. 1 kit 0  ? diphenhydrAMINE (BENADRYL) 25 MG tablet Take 25 mg by mouth every 6 (six) hours as needed for allergies.    ? gabapentin (NEURONTIN) 300 MG capsule Take 1 capsule (300 mg total) by mouth daily. 90 capsule 1  ? glucose blood (TRUE METRIX BLOOD GLUCOSE TEST) test strip Use as directed to test blood sugar 200 each 2  ? Insulin Pen Needle 32G X 4 MM MISC Use 1 as directed in the morning, at noon, in the evening, and at bedtime (4 times a day) with insulin 100 each 3  ? lipase/protease/amylase (CREON) 36000 UNITS CPEP capsule Take 1 capsule (36,000 Units total) by mouth 3 (three) times daily with meals. 100 capsule 5  ? metFORMIN (GLUCOPHAGE) 1000 MG tablet Take 1 tablet (1,000 mg total) by mouth 2 (two) times daily with a meal. 90 tablet 3  ? Multiple Vitamin (MULTIVITAMIN) tablet Take 1 tablet by mouth daily.    ? pregabalin (LYRICA) 50 MG capsule Take 1 capsule (50 mg total) by mouth 2 (two) times daily. 60 capsule 3  ? ranitidine (ZANTAC) 150 MG tablet Take 150 mg by mouth daily as needed for heartburn.    ? TRUEplus Lancets 28G MISC use as directed to test blood sugar 100 each 0  ? ?No current facility-administered medications on file prior to visit.  ? ? ?Family History  ?Problem Relation Age of Onset  ? Breast cancer Mother   ? Thyroid disease Mother    ? Breast cancer Sister   ? Pancreatic cancer Maternal Grandmother   ? Ovarian cancer Maternal Grandmother   ? Hypertension Paternal Grandfather   ? ? ?Social History  ? ?Socioeconomic History  ? Marital status: Single  ?  Spouse name: Not on file  ? Number of children: Not on file  ? Years of education: Not on file  ? Highest education level: Not on file  ?Occupational History  ? Not on file  ?Tobacco Use  ? Smoking status: Never  ? Smokeless tobacco: Never  ?Substance and Sexual Activity  ? Alcohol use: Not on file  ? Drug use: Not on file  ? Sexual activity: Not on file  ?Other Topics Concern  ? Not on file  ?Social History Narrative  ? Not on file  ? ?Social Determinants of Health  ? ?Financial Resource Strain: Not on file  ?Food Insecurity: No Food Insecurity  ? Worried About Charity fundraiser in the Last Year: Never true  ? Ran Out of Food in the Last Year: Never true  ?Transportation Needs: No Transportation Needs  ? Lack of Transportation (Medical): No  ? Lack of Transportation (Non-Medical): No  ?Physical Activity: Not on file  ?Stress: Not on file  ?Social Connections: Not on file  ?Intimate Partner Violence: Not At Risk  ? Fear of Current  or Ex-Partner: No  ? Emotionally Abused: No  ? Physically Abused: No  ? Sexually Abused: No  ? ? ?Review of Systems: ?ROS negative except for what is noted on the assessment and plan. ? ?Objective:  ? ?Vitals:  ? 10/19/21 1527  ?BP: 121/68  ?Pulse: 64  ?Temp: 98.5 ?F (36.9 ?C)  ?TempSrc: Oral  ?SpO2: 100%  ?Weight: 198 lb 11.2 oz (90.1 kg)  ?Height: _0  (1.93 m)  ? ? ?Physical Exam: ?Gen: A&O x3 and in no apparent distress, well appearing and nourished. ?HEENT:  ?  Head - normocephalic, atraumatic.  ?  Eye - visual acuity grossly intact, conjunctiva clear, sclera non-icteric, EOM intact.  ?  Mouth - No obvious caries or periodontal disease. ?Neck: no masses or nodules, AROM intact. ?CV: RRR, no murmurs, S1/S2 presents  ?Resp: Clear to ascultation bilaterally  ?Abd:  BS (+) x4, soft, non-tender abdomen, without hepatosplenomegaly or masses ?MSK: Grossly normal AROM and strength x4 extremities. ?Skin: good skin turgor, no rashes, unusual bruising, or prominent lesions.  ?Neuro: No focal deficits, grossly normal sensation and coordination.  ?Psych: Oriented x3 and responding appropriately. Intact memory, normal mood, judgement, affect, and insight.  ? ? ?Assessment & Plan:  ?See Encounters Tab for problem based charting. ? ?AKI (acute kidney injury) ([place]) ?HPI: Patient presents for reevaluation of his elevated creatinine from previous appointment.  Time he had an elevated creatinine of 2.36 and a GFR of 31.  He denied any symptoms at that time.  Since then he states that he has not had any additional symptoms such as fevers, shortness of breath, chest pain, abdominal pain, change in bowel or bladder function, rash, night sweats, weight loss, early satiety.  He has not seen any blood in his urine nor is he had any pain with urination he denies any sick contacts or recent travel.  He did recently start Lyrica during this time for back pain.  Lyrica can rarely cause significant acute kidney failure and could be contributing to his presentation.  He denies any illicit drugs or toxic ingestions.  He has not had any systemic signs of hypervolemia such as lower extremity edema, abdominal bloating, or shortness of breath.  Additionally has no obvious signs of bleeding or hemodynamic compromise.  His major risk factors include a previous history of alcohol use disorder and diabetes. ? ?A/P: ?Acute on Chronic kidney disease: ?Considering the patient has no previous history of CKD, I will repeat his labs today. ?- CBC, CMP, Iron studies ?- UA complete ?- Will differ further work up until these result.  ? ?**ADDENDUM** ? ?Patient's labs confirm his kidney function with a repeat creatinine of 2.21 and a GFR of 37.  Urine is normal with the exception of mild dehydration.  There is no evidence  of protein gap.  No significant crab criteria other than anemia and kidney dysfunction. There is no evidence of microscopic hematuria or pyuria.  ? ? ?  Latest Ref Rng & Units 10/19/2021  ?  4:41 PM 10/11/2021  ?  4:34 PM 04/28/2021  ? 11:48 AM  ?BMP  ?Glucose 70 - 99 mg/dL 94   85   320    ?BUN 6 - 24 mg/dL 36   38   10    ?Creatinine 0.76 - 1.27 mg/dL 2.21   2.36   0.84    ?BUN/Creat Ratio 9 - _1 ?Sodium 134 - 144 mmol/L 141  142   139    ?Potassium 3.5 - 5.2 mmol/L 4.4   4.9   4.6    ?Chloride 96 - 106 mmol/L 102   102   101    ?CO2 20 - 29 mmol/L _0 ?Calcium 8.7 - 10.2 mg/dL 9.3   9.8   9.5    ? ?Urinalysis ?   ?Component Value Date/Time  ? COLORURINE STRAW (A) 04/04/2021 1709  ? APPEARANCEUR Clear 10/19/2021 1635  ? LABSPEC 1.011 04/04/2021 1709  ? PHURINE 6.0 04/04/2021 1709  ? GLUCOSEU Negative 10/19/2021 1635  ? [place] NEGATIVE 04/04/2021 1709  ? BILIRUBINUR Negative 10/19/2021 1635  ? [place] NEGATIVE 04/04/2021 1709  ? PROTEINUR Negative 10/19/2021 1635  ? PROTEINUR NEGATIVE 04/04/2021 1709  ? NITRITE Negative 10/19/2021 1635  ? NITRITE NEGATIVE 04/04/2021 1709  ? LEUKOCYTESUR Negative 10/19/2021 1635  ? LEUKOCYTESUR NEGATIVE 04/04/2021 1709  ? ? ? ?  Latest Ref Rng & Units 10/19/2021  ?  4:41 PM 04/04/2021  ?  5:07 AM 04/03/2021  ?  4:58 AM  ?CBC  ?WBC 3.4 - 10.8 x10E3/uL 6.3   2.4   2.6    ?Hemoglobin 13.0 - 17.7 g/dL 10.6   9.5   9.1    ?Hematocrit 37.5 - 51.0 % 32.7   29.4   29.1    ?Platelets 150 - 450 x10E3/uL 220   152   152    ? ? ?**ADDENDDUM** ?Had patient come in on 10/20/2021 to assess for postobstructive causes of his kidney dysfunction.  Postvoid residual showed minimal retention of roughly 10 to 20 cc.  Bedside renal ultrasound showed no obvious hydronephrosis.  Instructed patient to return to clinic on Monday to perform a fluid challenge.  Instructed patient on how to perform fluid challenge.  We will call on-call nephrology to get more recommendations regarding  his case.  He will likely need a referral to nephrology formally and possible kidney biopsy in the near future.  Patient is currently trying to get health insurance to help with this cost. ? ? ? ? ?[place]

## 2021-10-20 NOTE — Assessment & Plan Note (Addendum)
HPI: Patient presents for reevaluation of his elevated creatinine from previous appointment.  Time he had an elevated creatinine of 2.36 and a GFR of 31.  He denied any symptoms at that time.  Since then he states that he has not had any additional symptoms such as fevers, shortness of breath, chest pain, abdominal pain, change in bowel or bladder function, rash, night sweats, weight loss, early satiety.  He has not seen any blood in his urine nor is he had any pain with urination he denies any sick contacts or recent travel.  He did recently start Lyrica during this time for back pain.  Lyrica can rarely cause significant acute kidney failure and could be contributing to his presentation.  He denies any illicit drugs or toxic ingestions.  He has not had any systemic signs of hypervolemia such as lower extremity edema, abdominal bloating, or shortness of breath.  Additionally has no obvious signs of bleeding or hemodynamic compromise.  His major risk factors include a previous history of alcohol use disorder and diabetes. ? ?A/P: ?Acute on Chronic kidney disease: ?Considering the patient has no previous history of CKD, I will repeat his labs today. ?- CBC, CMP, Iron studies ?- UA complete ?- Will differ further work up until these result.  ? ?**ADDENDUM** ? ?Patient's labs confirm his kidney function with a repeat creatinine of 2.21 and a GFR of 37.  Urine is normal with the exception of mild dehydration.  There is no evidence of protein gap.  No significant crab criteria other than anemia and kidney dysfunction. There is no evidence of microscopic hematuria or pyuria.  ? ? ?  Latest Ref Rng & Units 10/19/2021  ?  4:41 PM 10/11/2021  ?  4:34 PM 04/28/2021  ? 11:48 AM  ?BMP  ?Glucose 70 - 99 mg/dL 94   85   320    ?BUN 6 - 24 mg/dL 36   38   10    ?Creatinine 0.76 - 1.27 mg/dL 2.21   2.36   0.84    ?BUN/Creat Ratio 9 - 20 16   16   12     ?Sodium 134 - 144 mmol/L 141   142   139    ?Potassium 3.5 - 5.2 mmol/L 4.4   4.9    4.6    ?Chloride 96 - 106 mmol/L 102   102   101    ?CO2 20 - 29 mmol/L 24   22   23     ?Calcium 8.7 - 10.2 mg/dL 9.3   9.8   9.5    ? ?Urinalysis ?   ?Component Value Date/Time  ? COLORURINE STRAW (A) 04/04/2021 1709  ? APPEARANCEUR Clear 10/19/2021 1635  ? LABSPEC 1.011 04/04/2021 1709  ? PHURINE 6.0 04/04/2021 1709  ? GLUCOSEU Negative 10/19/2021 1635  ? [place] NEGATIVE 04/04/2021 1709  ? BILIRUBINUR Negative 10/19/2021 1635  ? [place] NEGATIVE 04/04/2021 1709  ? PROTEINUR Negative 10/19/2021 1635  ? PROTEINUR NEGATIVE 04/04/2021 1709  ? NITRITE Negative 10/19/2021 1635  ? NITRITE NEGATIVE 04/04/2021 1709  ? LEUKOCYTESUR Negative 10/19/2021 1635  ? LEUKOCYTESUR NEGATIVE 04/04/2021 1709  ? ? ? ?  Latest Ref Rng & Units 10/19/2021  ?  4:41 PM 04/04/2021  ?  5:07 AM 04/03/2021  ?  4:58 AM  ?CBC  ?WBC 3.4 - 10.8 x10E3/uL 6.3   2.4   2.6    ?Hemoglobin 13.0 - 17.7 g/dL 10.6   9.5   9.1    ?Hematocrit  37.5 - 51.0 % 32.7   29.4   29.1    ?Platelets 150 - 450 x10E3/uL 220   152   152    ? ? ?**ADDENDDUM** ?Had patient come in on 10/20/2021 to assess for postobstructive causes of his kidney dysfunction.  Postvoid residual showed minimal retention of roughly 10 to 20 cc.  Bedside renal ultrasound showed no obvious hydronephrosis.  Instructed patient to return to clinic on Monday to perform a fluid challenge.  Instructed patient on how to perform fluid challenge.  We will call on-call nephrology to get more recommendations regarding his case.  He will likely need a referral to nephrology formally and possible kidney biopsy in the near future.  Patient is currently trying to get health insurance to help with this cost. ? ? ?

## 2021-10-21 ENCOUNTER — Encounter: Payer: Self-pay | Admitting: Internal Medicine

## 2021-10-24 ENCOUNTER — Other Ambulatory Visit: Payer: Self-pay

## 2021-10-24 DIAGNOSIS — N179 Acute kidney failure, unspecified: Secondary | ICD-10-CM

## 2021-10-25 ENCOUNTER — Telehealth: Payer: Medicaid Other

## 2021-10-25 ENCOUNTER — Telehealth: Payer: Self-pay

## 2021-10-25 LAB — URINALYSIS, COMPLETE
Bilirubin, UA: NEGATIVE
Glucose, UA: NEGATIVE
Ketones, UA: NEGATIVE
Leukocytes,UA: NEGATIVE
Nitrite, UA: NEGATIVE
Protein,UA: NEGATIVE
RBC, UA: NEGATIVE
Specific Gravity, UA: 1.005 (ref 1.005–1.030)
Urobilinogen, Ur: 0.2 mg/dL (ref 0.2–1.0)
pH, UA: 5.5 (ref 5.0–7.5)

## 2021-10-25 LAB — MICROSCOPIC EXAMINATION
Bacteria, UA: NONE SEEN
Casts: NONE SEEN /lpf
Epithelial Cells (non renal): NONE SEEN /hpf (ref 0–10)
RBC, Urine: NONE SEEN /hpf (ref 0–2)
WBC, UA: NONE SEEN /hpf (ref 0–5)

## 2021-10-25 LAB — BMP8+ANION GAP
Anion Gap: 16 mmol/L (ref 10.0–18.0)
BUN/Creatinine Ratio: 14 (ref 9–20)
BUN: 31 mg/dL — ABNORMAL HIGH (ref 6–24)
CO2: 22 mmol/L (ref 20–29)
Calcium: 9 mg/dL (ref 8.7–10.2)
Chloride: 98 mmol/L (ref 96–106)
Creatinine, Ser: 2.16 mg/dL — ABNORMAL HIGH (ref 0.76–1.27)
Glucose: 98 mg/dL (ref 70–99)
Potassium: 4.7 mmol/L (ref 3.5–5.2)
Sodium: 136 mmol/L (ref 134–144)
eGFR: 35 mL/min/{1.73_m2} — ABNORMAL LOW (ref >59)

## 2021-10-25 NOTE — Telephone Encounter (Signed)
?  Care Management  ? ?Outreach Note ? ?10/25/2021 ?Name: Allen Russell MRN: [id number] DOB: [birth year] ? ?Referred by: Steffanie Rainwater, MD ?Reason for referral : No chief complaint on file. ? ? ?An unsuccessful telephone outreach was attempted today. The patient was referred to the case management team for assistance with care management and care coordination.  ? ?Follow Up Plan: The patient has been provided with contact information for the care management team and has been advised to call with any health related questions or concerns.  ? ?Jodelle Gross, RN, BSN, CCM ?Care Management Coordinator ?[facility] ?Phone: [phone]/Fax: [phone]  ?

## 2021-10-25 NOTE — Progress Notes (Signed)
Internal Medicine Clinic Attending  Case discussed with Dr. Coe  At the time of the visit.  We reviewed the resident's history and exam and pertinent patient test results.  I agree with the assessment, diagnosis, and plan of care documented in the resident's note.  

## 2021-10-28 ENCOUNTER — Telehealth: Payer: Self-pay

## 2021-10-28 NOTE — Telephone Encounter (Signed)
?  Chronic Care Management  ? ?Note ? ?10/28/2021 ?Name: SUZANNE GARBERS MRN: [id number] DOB: 05/08/1961 ? ?Received voicemail 10/27/21 from patient.  Return call this morning and was unsuccessful, plan to contact patient 10/31/21 @11 :45. ? ? , RN, BSN, CCM ?Care Management Coordinator ?[facility] ?Phone: [phone]/Fax: [phone]  ?

## 2021-10-30 ENCOUNTER — Other Ambulatory Visit: Payer: Self-pay | Admitting: Internal Medicine

## 2021-10-30 DIAGNOSIS — N179 Acute kidney failure, unspecified: Secondary | ICD-10-CM

## 2021-10-31 ENCOUNTER — Ambulatory Visit: Payer: Medicaid Other

## 2021-10-31 NOTE — Chronic Care Management (AMB) (Signed)
? Care Management ?  ? RN Visit Note ? ?10/31/2021 ?Name: Allen Russell MRN: [id number] DOB: 09-Feb-1961 ? ?Subjective: ?Allen Russell is a 61 y.o. year old male who is a primary care patient of [place], Charisse March, MD. The care management team was consulted for assistance with disease management and care coordination needs.   ? ?Engaged with patient by telephone for follow up visit in response to provider referral for case management and/or care coordination services.  ? ?Consent to Services:  ? Allen Russell was given information about Care Management services today including:  ?Care Management services includes personalized support from designated clinical staff supervised by his physician, including individualized plan of care and coordination with other care providers ?24/7 contact phone numbers for assistance for urgent and routine care needs. ?The patient may stop case management services at any time by phone call to the office staff. ? ?Patient agreed to services and consent obtained.  ? ?Assessment: Review of patient past medical history, allergies, medications, health status, including review of consultants reports, laboratory and other test data, was performed as part of comprehensive evaluation and provision of chronic care management services.  ? ?SDOH (Social Determinants of Health) assessments and interventions performed:   ? ?Care Plan ? ?Allergies  ?Allergen Reactions  ? Morphine Other (See Comments)  ?  headache ? ?  ? ? ?Outpatient Encounter Medications as of 10/31/2021  ?Medication Sig  ? Blood Glucose Monitoring Suppl (TRUE METRIX METER) w/Device KIT Use up to four times daily as directed.  ? diphenhydrAMINE (BENADRYL) 25 MG tablet Take 25 mg by mouth every 6 (six) hours as needed for allergies.  ? gabapentin (NEURONTIN) 300 MG capsule Take 1 capsule (300 mg total) by mouth daily.  ? glucose blood (TRUE METRIX BLOOD GLUCOSE TEST) test strip Use as directed to test blood sugar  ? Insulin Pen Needle 32G X 4  MM MISC Use 1 as directed in the morning, at noon, in the evening, and at bedtime (4 times a day) with insulin  ? lipase/protease/amylase (CREON) 36000 UNITS CPEP capsule Take 1 capsule (36,000 Units total) by mouth 3 (three) times daily with meals.  ? metFORMIN (GLUCOPHAGE) 1000 MG tablet Take 1 tablet (1,000 mg total) by mouth 2 (two) times daily with a meal.  ? Multiple Vitamin (MULTIVITAMIN) tablet Take 1 tablet by mouth daily.  ? pregabalin (LYRICA) 50 MG capsule Take 1 capsule (50 mg total) by mouth 2 (two) times daily.  ? ranitidine (ZANTAC) 150 MG tablet Take 150 mg by mouth daily as needed for heartburn.  ? TRUEplus Lancets 28G MISC use as directed to test blood sugar  ? ?No facility-administered encounter medications on file as of 10/31/2021.  ? ? ?Patient Active Problem List  ? Diagnosis Date Noted  ? AKI (acute kidney injury) ([place]) 10/15/2021  ? Leg cramps 10/11/2021  ? Pancreatic insufficiency 06/28/2021  ? Type 2 diabetes mellitus with ketoacidosis ([place]) 04/12/2021  ? Dysautonomia ([place])   ? Postlaminectomy syndrome, lumbar region 09/11/2017  ? 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 ? ?Conditions to be addressed/monitored: DMII and Chronic Back Pain ? ?Care Plan : RN Care Manager Plan of Care  ?Updates made by Allen Killian, RN since 10/31/2021 12:00 AM  ?  ? ?Problem: Health Promotion or Disease Self-Management (General Plan of Care)   ?  ? ?Long-Range Goal: Chronic Disease Management and Care Coordination Needs (DM2, Chronic Back Pain)   ?Start Date: 07/29/2021  ?Recent Progress:  Not on track  ?Priority: High  ?Note:   ?Current Barriers: Incoming call from patient.  We discussed his concern for his kidney function and the muscle spasms he has in his legs at night.  Patient states he does not sleep well because he has muscle spasms at night in his legs and his feet.  We discussed the possibility of him exercising such as taking a walk after work. Patient states that  he is eating well, watching his diet and he keeps himself hydrated with water.  He shared that the highest CBG reading he has had was 170.  We discussed his insurance status and he now has health insurance as of this past weekend.  He is aware he needs to call the clinic to make an appointment for follow up labs/OV. ?Knowledge Deficits related to plan of care for management of DMII and Chronic Back Pain  ?Chronic Disease Management support and education needs related to DMII and Chronic Back Pain  ?Financial Constraints  ?Difficulty obtaining medications ?RNCM Clinical Goal(s):  ?Patient will verbalize basic understanding of  DMII and Chronic Back Pain disease process and self health management plan as evidenced by discussions with providers and RNCM. ?demonstrate understanding of rationale for each prescribed medication as evidenced by ongoing discussions/questions to providers. ?demonstrate Improved adherence to prescribed treatment plan for DMII and Chronic Back Pain as evidenced by Allen episodes of hypoglycemia and reduced back pain. ?continue to work with RN Care Manager to address care management and care coordination needs related to  DMII and Chronic back pain as evidenced by adherence to CM Team Scheduled appointments ?work with Education officer, museum to address  related to the management of Financial constraints related to lack of insurance, Medication procurement, and   related to the management of DMII and Chronic back pain as evidenced by review of EMR and patient or social worker report ?not experience hospital admission as evidenced by review of EMR. Hospital Admissions in last 6 months = 1  through collaboration with RN Care manager, provider, and care team.  ?Interventions: ?1:1 collaboration with primary care provider regarding development and update of comprehensive plan of care as evidenced by provider attestation and co-signature ?Inter-disciplinary care team collaboration (see longitudinal plan of  care) ?Evaluation of current treatment plan related to  self management and patient's adherence to plan as established by provider ?Diabetes Interventions:  (Status:  Goal on track:  NO.) Long Term Goal  ?Assessed patient's understanding of A1c goal: <7% ?Provided education to patient about basic DM disease process ?Reviewed medications with patient and discussed importance of medication adherence ?Counseled on importance of regular laboratory monitoring as prescribed ?Discussed plans with patient for ongoing care management follow up and provided patient with direct contact information for care management team ?Reviewed scheduled/upcoming provider appointments including: 08/11/21@0845 . ?Lab Results  ?Component Value Date  ? HGBA1C 6.5 (A) 07/12/2021  ?Patient Goals/Self-Care Activities: ?Take all medications as prescribed ?Attend all scheduled provider appointments ?Call pharmacy for medication refills 3-7 days in advance of running out of medications ?Call provider office for new concerns or questions  ?Work with the Education officer, museum to address care coordination needs and will continue to work with the clinical team to address health care and disease management related needs ? ?Follow Up Plan:  The patient has been provided with contact information for the care management team and has been advised to call with any health related questions or concerns.   ?  ? ? ?Plan: The patient has been  provided with contact information for the care management team and has been advised to call with any health related questions or concerns.  ? ?Allen Killian, RN, BSN, CCM ?Care Management Coordinator ?[facility] ?Phone: [phone]/RWE: [phone]  ? ? ? ? ? ? ? ? ? ?

## 2021-10-31 NOTE — Patient Instructions (Signed)
Visit Information ? ?Thank you for taking time to visit with me today. Please don't hesitate to contact me if I can be of assistance to you before our next scheduled telephone appointment. ? ?Your next appointment is by telephone on 12/01/21 at 1:00 PM. ? ?Please call the care guide team at [phone] if you need to cancel or reschedule your appointment.  ? ?If you are experiencing a Mental Health or [place] or need someone to talk to, please call the [place] National Suicide Prevention Lifeline: 463-015-9661 or TTY: (346)629-1768 TTY (775) 251-7333) to talk to a trained counselor  ? ?Patient verbalizes understanding of instructions and care plan provided today and agrees to view in [place]. Active MyChart status confirmed with patient.   ? ?The patient has been provided with contact information for the care management team and has been advised to call with any health related questions or concerns.  ? ?Johnney Killian, RN, BSN, CCM ?Care Management Coordinator ?[facility] ?Phone: [phone]: [phone]  ?

## 2021-11-04 ENCOUNTER — Other Ambulatory Visit: Payer: Self-pay

## 2021-11-04 ENCOUNTER — Other Ambulatory Visit (HOSPITAL_COMMUNITY): Payer: Self-pay

## 2021-11-07 ENCOUNTER — Other Ambulatory Visit: Payer: Self-pay

## 2021-11-08 ENCOUNTER — Other Ambulatory Visit: Payer: Self-pay

## 2021-11-16 ENCOUNTER — Other Ambulatory Visit (HOSPITAL_COMMUNITY): Payer: Self-pay

## 2021-11-18 ENCOUNTER — Other Ambulatory Visit: Payer: Self-pay | Admitting: Internal Medicine

## 2021-11-18 DIAGNOSIS — N179 Acute kidney failure, unspecified: Secondary | ICD-10-CM

## 2021-12-01 ENCOUNTER — Telehealth: Payer: Medicaid Other

## 2021-12-05 ENCOUNTER — Telehealth: Payer: Medicaid Other | Admitting: Licensed Clinical Social Worker

## 2021-12-05 ENCOUNTER — Telehealth: Payer: Medicaid Other

## 2021-12-11 ENCOUNTER — Other Ambulatory Visit: Payer: Self-pay | Admitting: Student

## 2021-12-11 DIAGNOSIS — E1110 Type 2 diabetes mellitus with ketoacidosis without coma: Secondary | ICD-10-CM

## 2021-12-12 ENCOUNTER — Telehealth: Payer: Self-pay

## 2021-12-12 ENCOUNTER — Telehealth: Payer: Medicaid Other

## 2021-12-12 NOTE — Telephone Encounter (Signed)
  Care Management   Outreach Note  12/12/2021 Name: Allen Russell MRN: [id number] DOB: 05/27/1961  Referred by: Lacinda Axon, MD Reason for referral : Chronic Care Management   An unsuccessful telephone outreach was attempted today. The patient was referred to the case management team for assistance with care management and care coordination.   Follow Up Plan: The patient has been provided with contact information for the care management team and has been advised to call with any health related questions or concerns.   Johnney Killian, RN, BSN, CCM Care Management Coordinator [facility] Phone: [phone]: [phone]

## 2021-12-13 ENCOUNTER — Other Ambulatory Visit (HOSPITAL_COMMUNITY): Payer: Self-pay

## 2021-12-13 MED ORDER — METFORMIN HCL 1000 MG PO TABS
1000.0000 mg | ORAL_TABLET | Freq: Two times a day (BID) | ORAL | 3 refills | Status: DC
  Filled 2021-12-13: qty 90, 45d supply, fill #0

## 2021-12-13 NOTE — Telephone Encounter (Signed)
Patient sent message via my chart to contact our office to schedule an appointment. 

## 2021-12-14 ENCOUNTER — Other Ambulatory Visit (HOSPITAL_COMMUNITY): Payer: Self-pay

## 2021-12-21 ENCOUNTER — Other Ambulatory Visit (HOSPITAL_COMMUNITY): Payer: Self-pay

## 2021-12-22 ENCOUNTER — Other Ambulatory Visit (HOSPITAL_COMMUNITY): Payer: Self-pay

## 2021-12-22 ENCOUNTER — Ambulatory Visit: Payer: Self-pay

## 2021-12-23 ENCOUNTER — Other Ambulatory Visit (HOSPITAL_COMMUNITY): Payer: Self-pay

## 2022-01-09 ENCOUNTER — Other Ambulatory Visit (HOSPITAL_COMMUNITY): Payer: Self-pay

## 2022-01-16 ENCOUNTER — Other Ambulatory Visit (HOSPITAL_COMMUNITY): Payer: Self-pay

## 2022-01-17 ENCOUNTER — Other Ambulatory Visit (HOSPITAL_COMMUNITY): Payer: Self-pay

## 2022-02-16 ENCOUNTER — Other Ambulatory Visit (HOSPITAL_COMMUNITY): Payer: Self-pay

## 2022-03-20 ENCOUNTER — Other Ambulatory Visit (HOSPITAL_COMMUNITY): Payer: Self-pay

## 2022-03-21 ENCOUNTER — Other Ambulatory Visit (HOSPITAL_COMMUNITY): Payer: Self-pay

## 2022-06-16 ENCOUNTER — Ambulatory Visit: Payer: Self-pay | Admitting: Licensed Clinical Social Worker

## 2022-06-16 NOTE — Patient Outreach (Signed)
SW removed from care team.  Tiahna Cure, BSW , MSW, LCSW-A Social Worker [facility] Care Management  [phone]   

## 2022-11-08 NOTE — Progress Notes (Deleted)
Date:  11/08/2022   HPI: Allen Russell is a 62 y.o. male who presents to the [facility] pharmacy clinic to discuss and initiate PrEP.  Insured   [x]    Uninsured  []    Patient Active Problem List   Diagnosis Date Noted   AKI (acute kidney injury) ([facility]) 10/15/2021   Leg cramps 10/11/2021   Pancreatic insufficiency 06/28/2021   Type 2 diabetes mellitus with ketoacidosis ([facility]) 04/12/2021   Dysautonomia ([facility])    Postlaminectomy syndrome, lumbar region 09/11/2017   Spondylosis without myelopathy or radiculopathy, lumbar region 09/11/2017    Patient's Medications  New Prescriptions   No medications on file  Previous Medications   BLOOD GLUCOSE MONITORING SUPPL (TRUE METRIX METER) W/DEVICE KIT    Use up to four times daily as directed.   DIPHENHYDRAMINE (BENADRYL) 25 MG TABLET    Take 25 mg by mouth every 6 (six) hours as needed for allergies.   GABAPENTIN (NEURONTIN) 300 MG CAPSULE    Take 1 capsule (300 mg total) by mouth daily.   GLUCOSE BLOOD (TRUE METRIX BLOOD GLUCOSE TEST) TEST STRIP    Use as directed to test blood sugar   INSULIN PEN NEEDLE 32G X 4 MM MISC    Use 1 as directed in the morning, at noon, in the evening, and at bedtime (4 times a day) with insulin   LIPASE/PROTEASE/AMYLASE (CREON) 36000 UNITS CPEP CAPSULE    Take 1 capsule (36,000 Units total) by mouth 3 (three) times daily with meals.   METFORMIN (GLUCOPHAGE) 1000 MG TABLET    Take 1 tablet (1,000 mg total) by mouth 2 (two) times daily with a meal.   MULTIPLE VITAMIN (MULTIVITAMIN) TABLET    Take 1 tablet by mouth daily.   PREGABALIN (LYRICA) 50 MG CAPSULE    Take 1 capsule (50 mg total) by mouth 2 (two) times daily.   RANITIDINE (ZANTAC) 150 MG TABLET    Take 150 mg by mouth daily as needed for heartburn.   TRUEPLUS LANCETS 28G MISC    Use as directed to test blood sugar  Modified Medications   No medications on file  Discontinued Medications   No medications on file    Allergies: Allergies  Allergen Reactions    Morphine Other (See Comments)    headache      Past Medical History: Past Medical History:  Diagnosis Date   'light-for-dates' infant with signs of fetal malnutrition 04/12/2021   Adrenal insufficiency (HCC) 04/12/2021   Diabetes mellitus without complication (HCC)    Ketoacidosis 03/21/2021   Shock (HCC)     Social History: Social History   Socioeconomic History   Marital status: Single    Spouse name: Not on file   Number of children: Not on file   Years of education: Not on file   Highest education level: Not on file  Occupational History   Not on file  Tobacco Use   Smoking status: Never   Smokeless tobacco: Never  Substance and Sexual Activity   Alcohol use: Not on file   Drug use: Not on file   Sexual activity: Not on file  Other Topics Concern   Not on file  Social History Narrative   Not on file   Social Determinants of Health   Financial Resource Strain: Not on file  Food Insecurity: No Food Insecurity (07/14/2021)   Hunger Vital Sign    Worried About Running Out of Food in the Last Year: Never true    Ran Out  of Food in the Last Year: Never true  Transportation Needs: No Transportation Needs (07/14/2021)   PRAPARE - Administrator, Civil Service (Medical): No    Lack of Transportation (Non-Medical): No  Physical Activity: Not on file  Stress: Not on file  Social Connections: Not on file        No data to display          Labs:  SCr: Lab Results  Component Value Date   CREATININE 2.16 (H) 10/24/2021   CREATININE 2.21 (H) 10/19/2021   CREATININE 2.36 (H) 10/11/2021   CREATININE 0.84 04/28/2021   CREATININE 0.87 04/11/2021   HIV Lab Results  Component Value Date   HIV Non Reactive 03/22/2021   Hepatitis B Lab Results  Component Value Date   HEPBSAG NON REACTIVE 03/24/2021   Hepatitis C No results found for: "HEPCAB", "HCVRNAPCRQN" Hepatitis A No results found for: "HAV" RPR and STI No results found for: "LABRPR",  "RPRTITER"      No data to display          Assessment: ***  Plan: ***  Irish Elders, PharmD PGY-1 [facility] Resident

## 2022-11-09 ENCOUNTER — Ambulatory Visit: Payer: No Typology Code available for payment source | Admitting: Pharmacist

## 2022-11-21 ENCOUNTER — Ambulatory Visit: Payer: No Typology Code available for payment source | Admitting: Pharmacist

## 2022-11-21 ENCOUNTER — Other Ambulatory Visit (HOSPITAL_COMMUNITY): Payer: Self-pay

## 2022-11-21 NOTE — Progress Notes (Deleted)
Date:  11/21/2022   HPI: Allen Russell is a 62 y.o. male who presents to the [facility] pharmacy clinic to discuss and initiate PrEP.  Insured   [x]    Uninsured  []    Patient Active Problem List   Diagnosis Date Noted   AKI (acute kidney injury) (HCC) 10/15/2021   Leg cramps 10/11/2021   Pancreatic insufficiency 06/28/2021   Type 2 diabetes mellitus with ketoacidosis (HCC) 04/12/2021   Dysautonomia (HCC)    Postlaminectomy syndrome, lumbar region 09/11/2017   Spondylosis without myelopathy or radiculopathy, lumbar region 09/11/2017    Patient's Medications  New Prescriptions   No medications on file  Previous Medications   BLOOD GLUCOSE MONITORING SUPPL (TRUE METRIX METER) W/DEVICE KIT    Use up to four times daily as directed.   DIPHENHYDRAMINE (BENADRYL) 25 MG TABLET    Take 25 mg by mouth every 6 (six) hours as needed for allergies.   GABAPENTIN (NEURONTIN) 300 MG CAPSULE    Take 1 capsule (300 mg total) by mouth daily.   GLUCOSE BLOOD (TRUE METRIX BLOOD GLUCOSE TEST) TEST STRIP    Use as directed to test blood sugar   INSULIN PEN NEEDLE 32G X 4 MM MISC    Use 1 as directed in the morning, at noon, in the evening, and at bedtime (4 times a day) with insulin   LIPASE/PROTEASE/AMYLASE (CREON) 36000 UNITS CPEP CAPSULE    Take 1 capsule (36,000 Units total) by mouth 3 (three) times daily with meals.   METFORMIN (GLUCOPHAGE) 1000 MG TABLET    Take 1 tablet (1,000 mg total) by mouth 2 (two) times daily with a meal.   MULTIPLE VITAMIN (MULTIVITAMIN) TABLET    Take 1 tablet by mouth daily.   PREGABALIN (LYRICA) 50 MG CAPSULE    Take 1 capsule (50 mg total) by mouth 2 (two) times daily.   RANITIDINE (ZANTAC) 150 MG TABLET    Take 150 mg by mouth daily as needed for heartburn.   TRUEPLUS LANCETS 28G MISC    Use as directed to test blood sugar  Modified Medications   No medications on file  Discontinued Medications   No medications on file    Allergies: Allergies  Allergen Reactions    Morphine Other (See Comments)    headache      Past Medical History: Past Medical History:  Diagnosis Date   'light-for-dates' infant with signs of fetal malnutrition 04/12/2021   Adrenal insufficiency (HCC) 04/12/2021   Diabetes mellitus without complication (HCC)    Ketoacidosis 03/21/2021   Shock (HCC)     Social History: Social History   Socioeconomic History   Marital status: Single    Spouse name: Not on file   Number of children: Not on file   Years of education: Not on file   Highest education level: Not on file  Occupational History   Not on file  Tobacco Use   Smoking status: Never   Smokeless tobacco: Never  Substance and Sexual Activity   Alcohol use: Not on file   Drug use: Not on file   Sexual activity: Not on file  Other Topics Concern   Not on file  Social History Narrative   Not on file   Social Determinants of Health   Financial Resource Strain: Not on file  Food Insecurity: No Food Insecurity (07/14/2021)   Hunger Vital Sign    Worried About Running Out of Food in the Last Year: Never true    Ran Out  of Food in the Last Year: Never true  Transportation Needs: No Transportation Needs (07/14/2021)   PRAPARE - Administrator, Civil Service (Medical): No    Lack of Transportation (Non-Medical): No  Physical Activity: Not on file  Stress: Not on file  Social Connections: Not on file        No data to display          Labs:  SCr: Lab Results  Component Value Date   CREATININE 2.16 (H) 10/24/2021   CREATININE 2.21 (H) 10/19/2021   CREATININE 2.36 (H) 10/11/2021   CREATININE 0.84 04/28/2021   CREATININE 0.87 04/11/2021   HIV Lab Results  Component Value Date   HIV Non Reactive 03/22/2021   Hepatitis B Lab Results  Component Value Date   HEPBSAG NON REACTIVE 03/24/2021   Hepatitis C No results found for: "HEPCAB", "HCVRNAPCRQN" Hepatitis A No results found for: "HAV" RPR and STI No results found for: "LABRPR",  "RPRTITER"      No data to display          Assessment: ***  Plan: ***  Irish Elders, PharmD PGY-1 [facility] Resident

## 2022-12-07 IMAGING — MR MR HEAD WO/W CM
13 series · 48 of 48 positions shown · IV contrast (gadavist)
Comparison: None.

CLINICAL DATA: Headache, new or worsening (Age >= 50y) New onset
diabetes insipudus. Ongoing headaches, unknown cause

EXAM:
MRI HEAD WITHOUT AND WITH CONTRAST
TECHNIQUE: Multiplanar, multiecho pulse sequences of the brain and surrounding
structures were obtained without and with intravenous contrast.
CONTRAST:  8mL GADAVIST GADOBUTROL 1 MMOL/ML IV SOLN

[Series 5: DWI · axial · 3.0mm · 1.36mm/px · z∈[-42,+122]mm · 6 of 111 slices shown (1 of 2)]
[im 1/111]
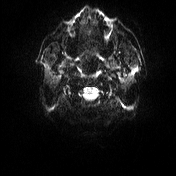
[im 23/111]
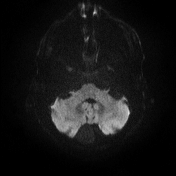
[im 45/111]
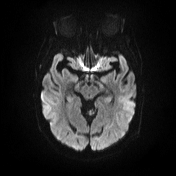
[im 67/111]
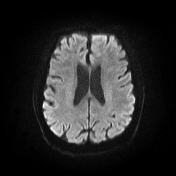
[im 89/111]
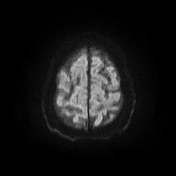
[im 111/111]
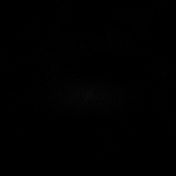

[Series 6: DWI · axial · 3.0mm · 1.36mm/px · z∈[-42,+119]mm · 3 of 55 slices shown (2 of 2)]
[im 1/55]
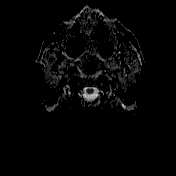
[im 28/55]
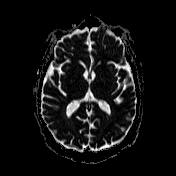
[im 55/55]
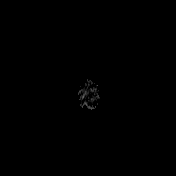

[Series 7: T1 · sagittal · 5.0mm · 0.75mm/px · 1 of 24 slices shown (1 of 2)]
[im 1/24]
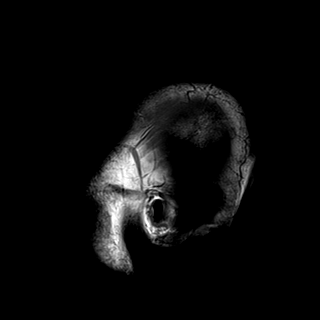

[Series 8: T2 · axial · 5.0mm · 0.62mm/px · z∈[-49,+125]mm · 2 of 28 slices shown]
[im 1/28]
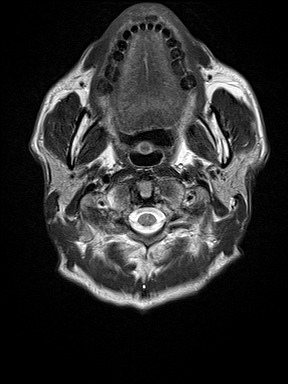
[im 28/28]
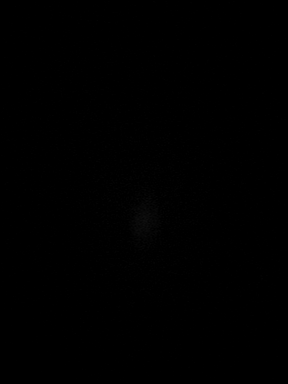

[Series 9: swi_images · axial · 3.0mm · 0.75mm/px · z∈[-50,+126]mm · 3 of 60 slices shown]
[im 1/60]
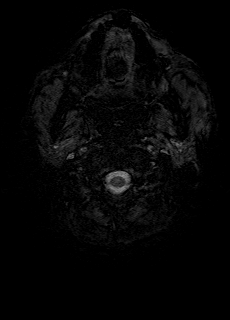
[im 30/60]
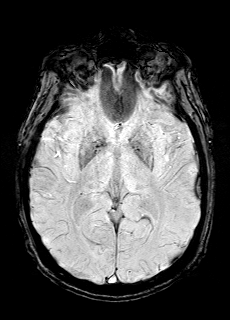
[im 60/60]
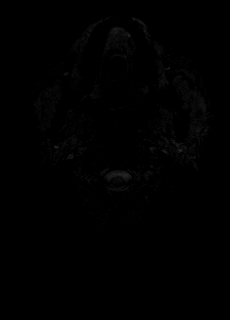

[Series 11: FLAIR · axial · 3.0mm · 0.75mm/px · z∈[-41,+117]mm · 3 of 54 slices shown]
[im 1/54]
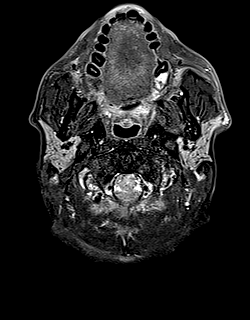
[im 27/54]
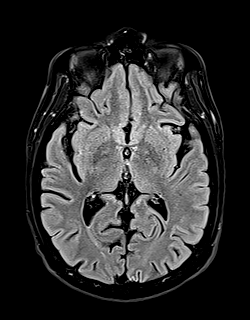
[im 54/54]
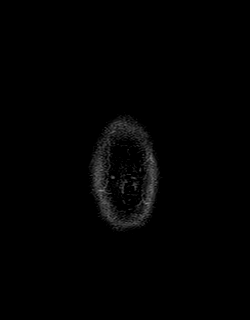

[Series 12: T1 · axial · 1.0mm · 0.94mm/px · z∈[-50,+124]mm · 10 of 176 slices shown (2 of 2)]
[im 1/176]
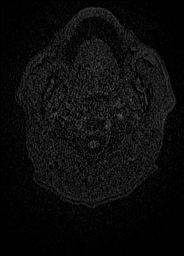
[im 20/176]
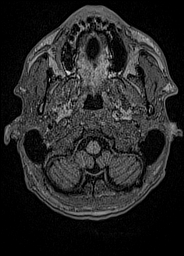
[im 39/176]
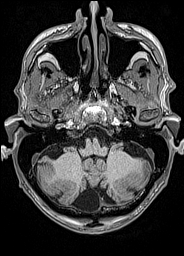
[im 59/176]
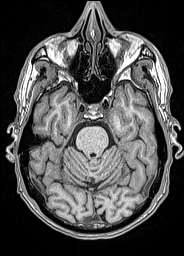
[im 78/176]
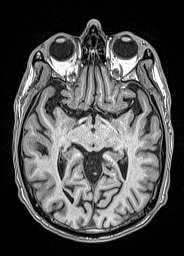
[im 98/176]
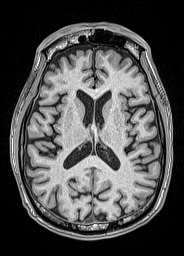
[im 117/176]
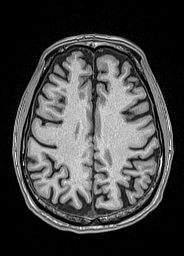
[im 137/176]
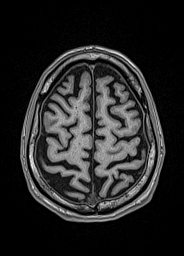
[im 156/176]
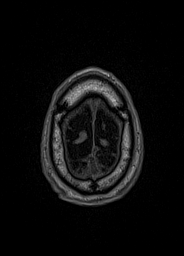
[im 176/176]
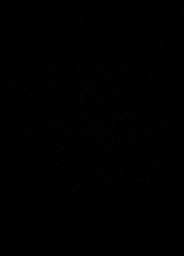

[Series 13: cor dwi_tracew · coronal · 5.0mm · 1.53mm/px · 3 of 58 slices shown]
[im 1/58]
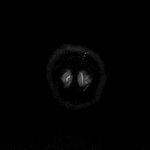
[im 29/58]
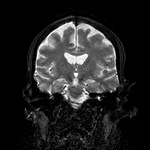
[im 58/58]
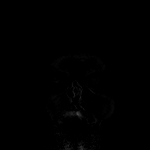

[Series 14: cor dwi_adc · coronal · 5.0mm · 1.53mm/px · 2 of 29 slices shown]
[im 1/29]
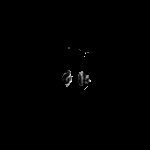
[im 29/29]
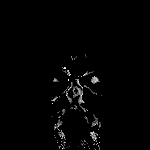

[Series 15: T2 post-contrast · coronal · 5.0mm · 0.57mm/px · 2 of 32 slices shown]
[im 1/32]
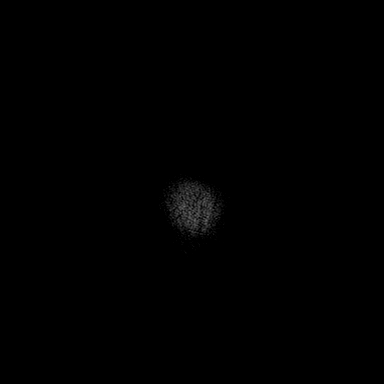
[im 32/32]
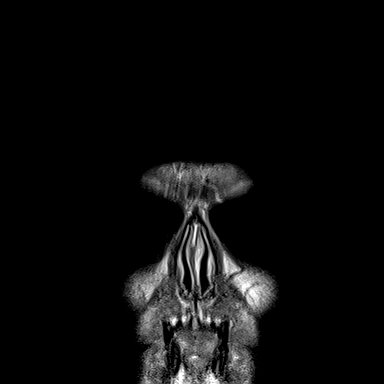

[Series 16: T1 post-contrast · axial · 1.0mm · 0.94mm/px · z∈[-50,+124]mm · 10 of 176 slices shown (1 of 3)]
[im 1/176]
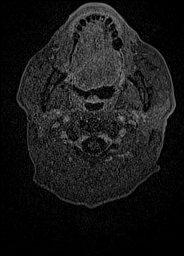
[im 20/176]
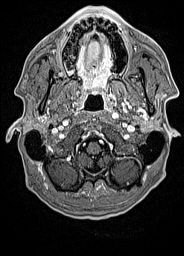
[im 39/176]
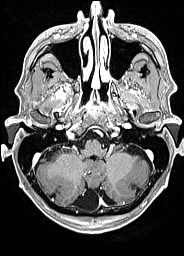
[im 59/176]
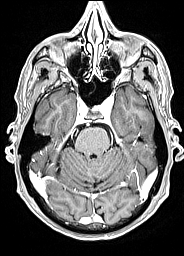
[im 78/176]
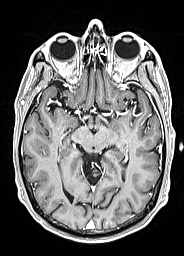
[im 98/176]
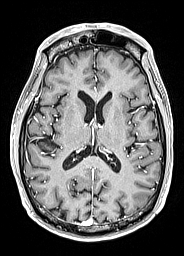
[im 117/176]
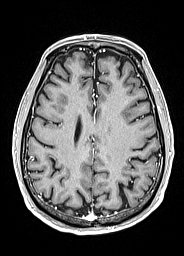
[im 137/176]
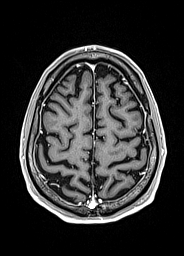
[im 156/176]
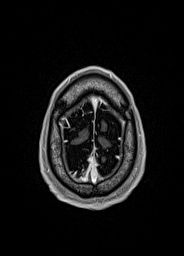
[im 176/176]
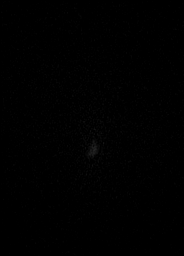

[Series 17: T1 post-contrast · coronal · 5.0mm · 0.43mm/px · 2 of 32 slices shown (2 of 3)]
[im 1/32]
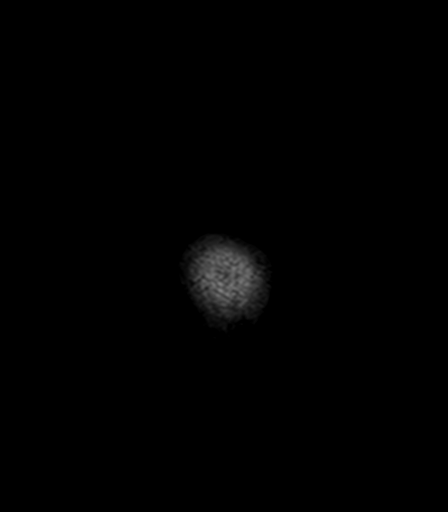
[im 32/32]
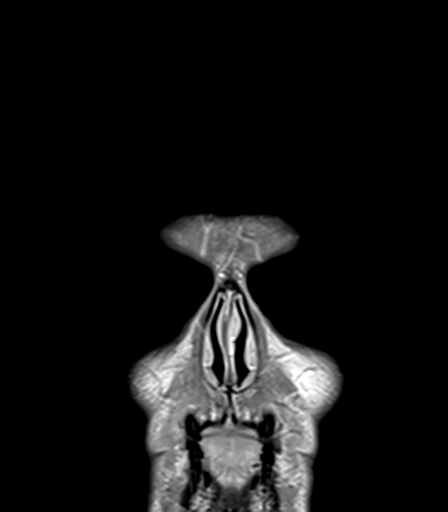

[Series 18: T1 post-contrast · sagittal · 5.0mm · 0.75mm/px · 1 of 24 slices shown (3 of 3)]
[im 1/24]
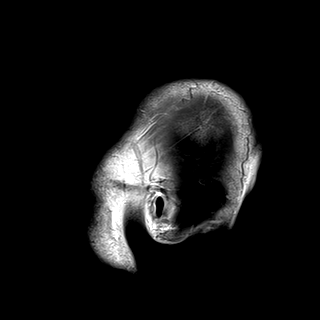

[48 of 48 positions shown; findings below may reference images not displayed]

FINDINGS: Brain: There is no evidence of acute intracranial hemorrhage,
extra-axial fluid collection, or infarct.

There is mild parenchymal volume loss. The ventricles are not
enlarged. There is no suspicious parenchymal signal abnormality.

There is no mass lesion.  There is no abnormal enhancement.

The pituitary is unremarkable.

Vascular: Normal flow voids.

Skull and upper cervical spine: Normal marrow signal.

Sinuses/Orbits: The paranasal sinuses are clear. The globes and
orbits are unremarkable.

Other: None.
IMPRESSION: 1. No acute intracranial pathology.
2. No abnormal mass lesion or abnormal enhancement.

## 2022-12-11 IMAGING — US US ABDOMEN LIMITED
1 series · 15 of 25 positions shown · non-contrast
Comparison: 03/24/2021

CLINICAL DATA: Hepatic cysts on CT, unintentional weight loss,
abdominal pain

EXAM:
ULTRASOUND ABDOMEN LIMITED RIGHT UPPER QUADRANT

[Series 1: us abdomen limited ruq mc & wl · 30 acquisitions, 15 frames shown]
[im 1/30]
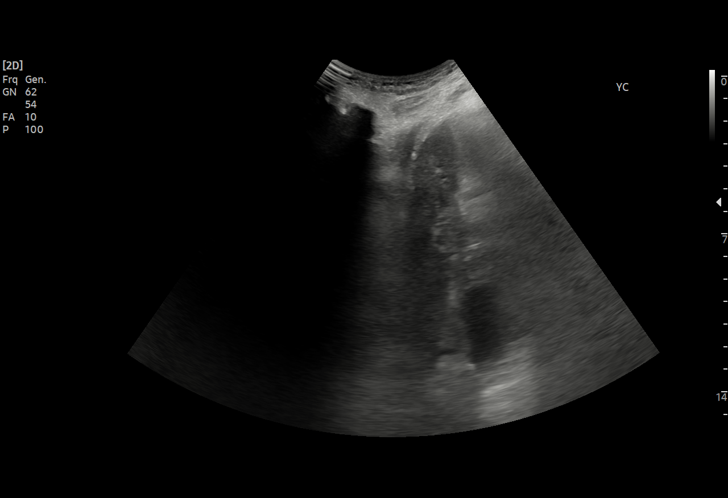
[im 3/30]
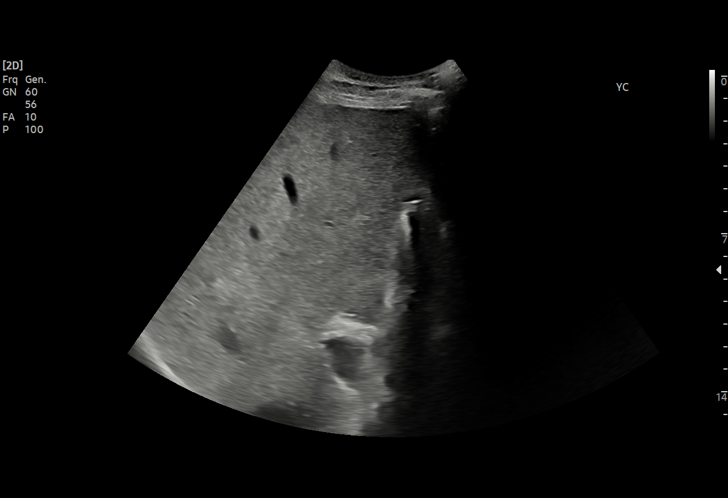
[im 5/30]
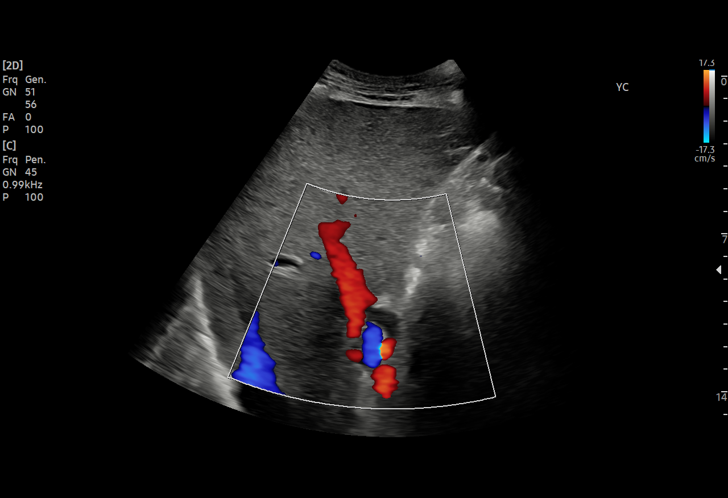
[im 7/30]
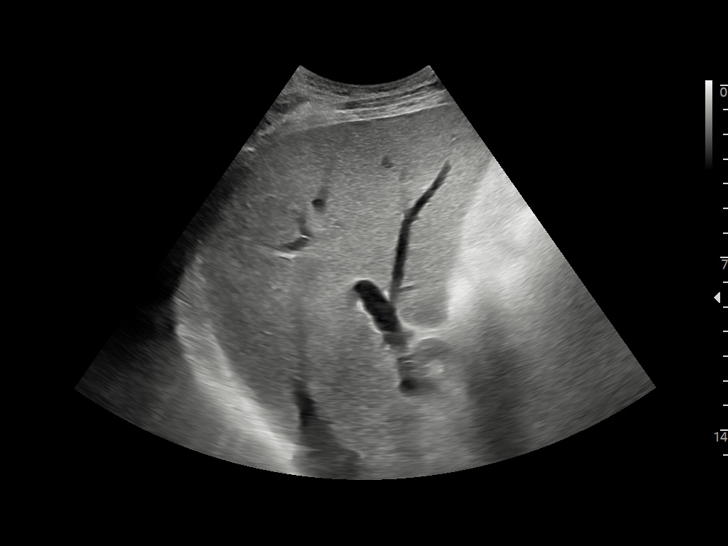
[im 9/30]
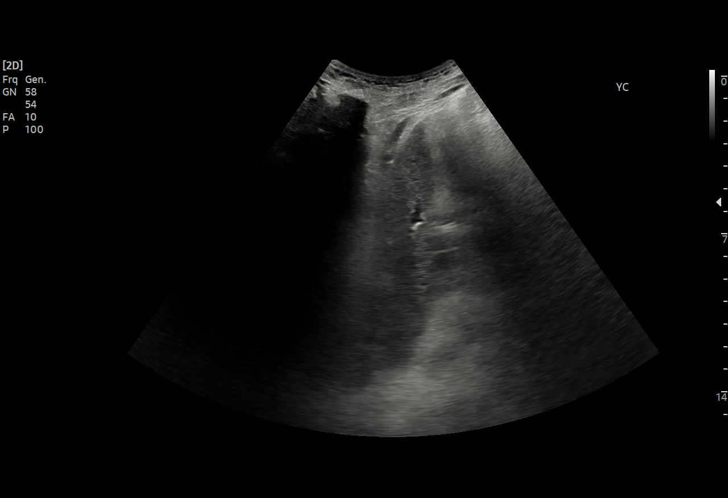
[im 11/30]
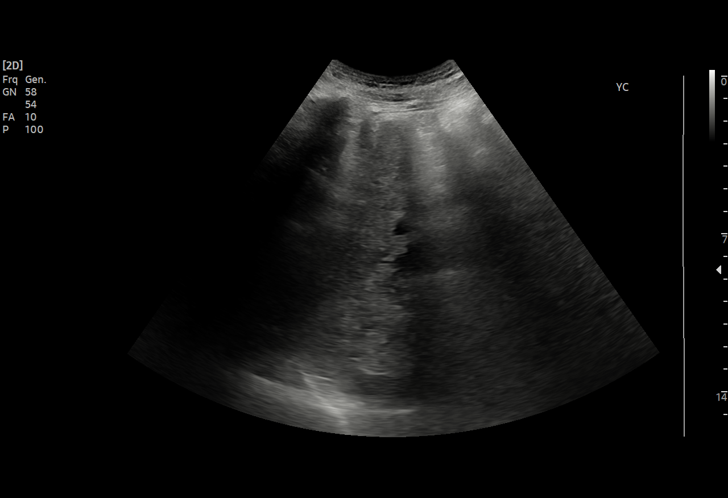
[im 13/30]
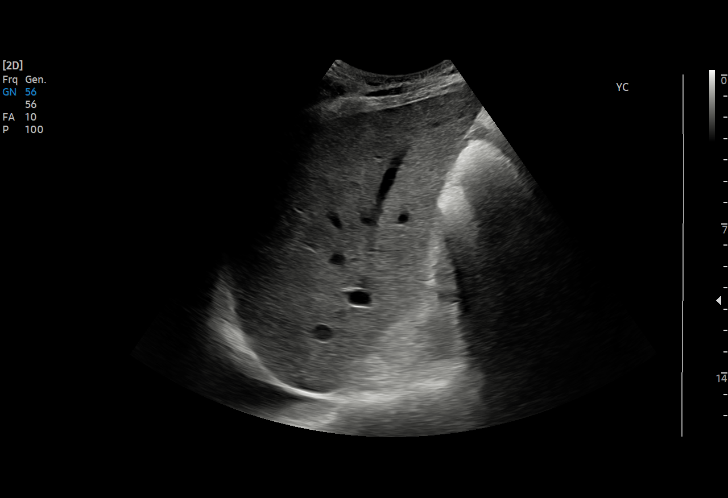
[im 15/30]
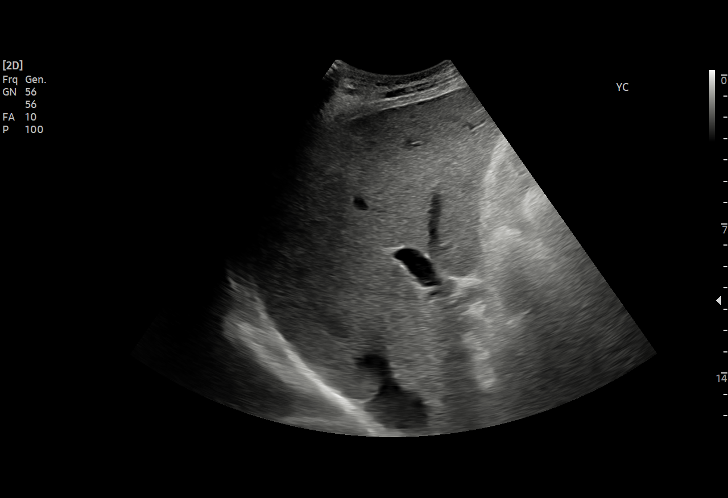
[im 17/30]
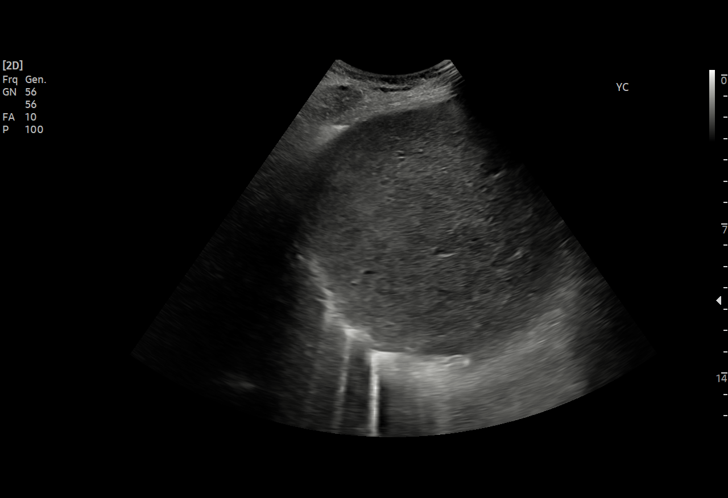
[im 19/30]
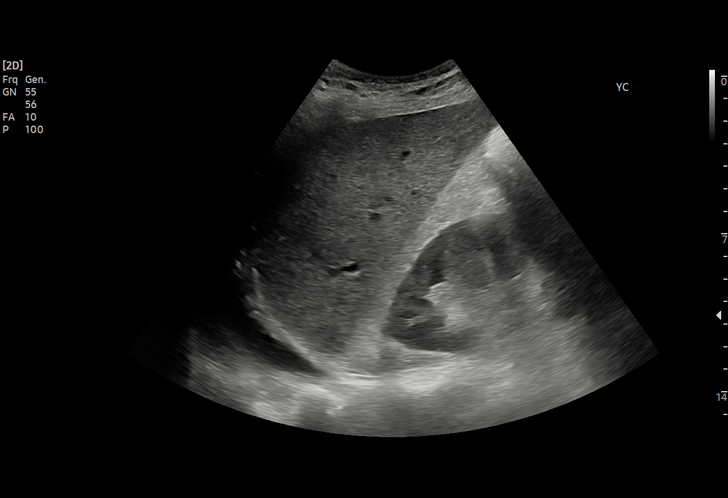
[im 21/30]
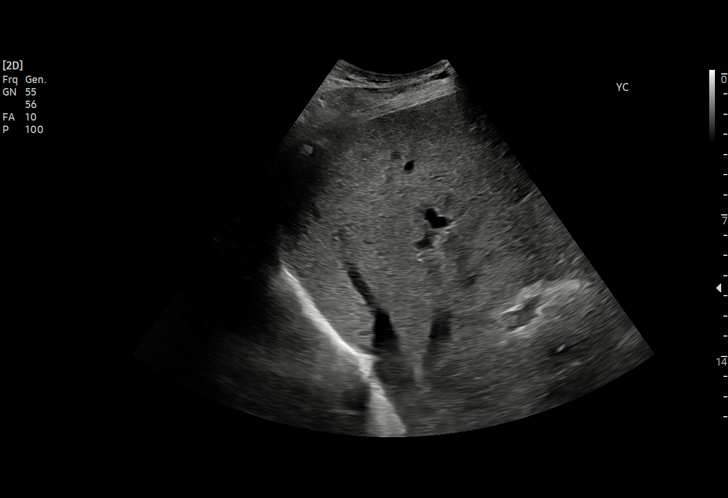
[im 23/30]
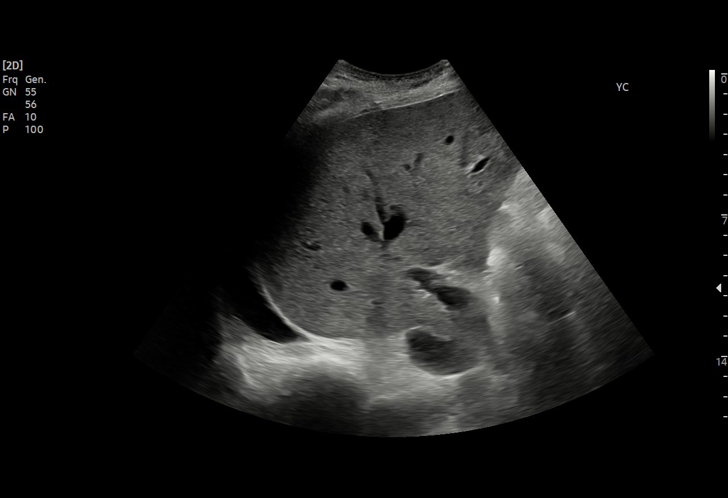
[im 25/30]
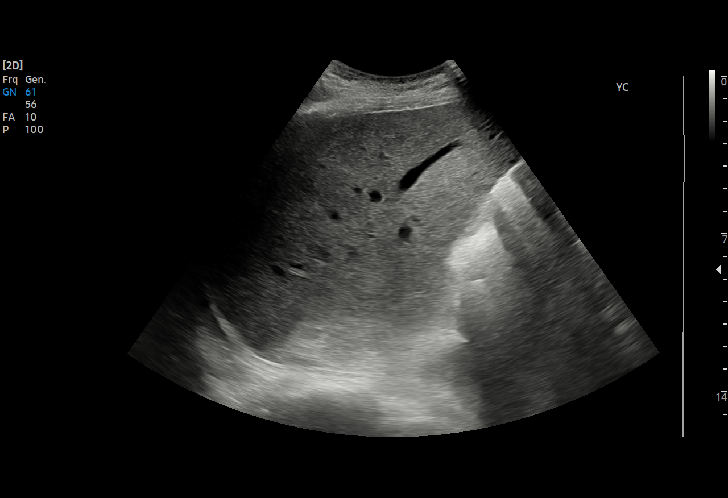
[im 27/30]
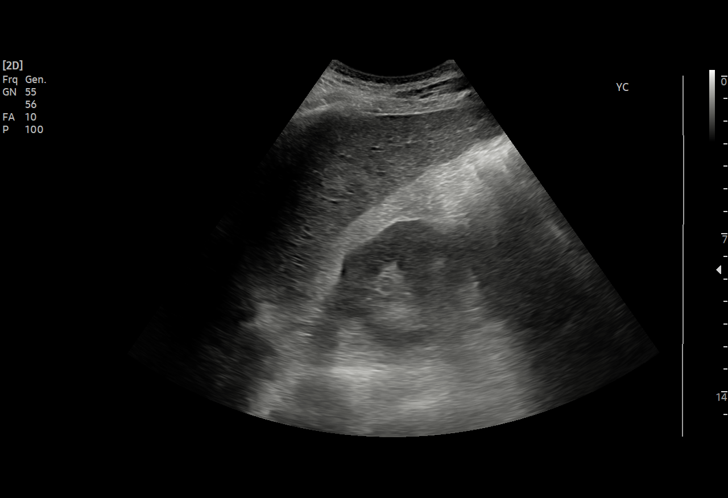
[im 30/30]
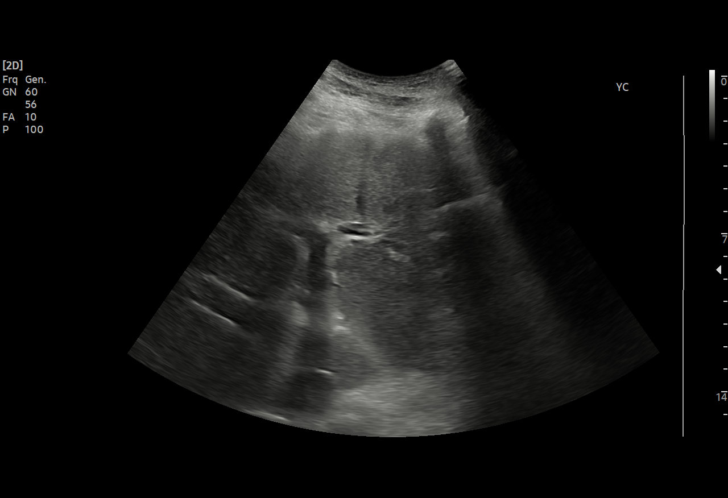

[15 of 25 positions shown; findings below may reference images not displayed]

FINDINGS: Gallbladder:

Surgically absent.

Common bile duct:

Diameter: 8 mm

Liver:

Liver demonstrates normal echotexture. No focal abnormalities.
Specifically, the punctate hypodensities seen on recent CT are too
small to characterize by ultrasound, though are likely benign cysts
based on long-term stability. No intrahepatic duct dilation. Portal
vein is patent on color Doppler imaging with normal direction of
blood flow towards the liver.

Other: None.
IMPRESSION: 1. Unremarkable right upper quadrant ultrasound in this patient
status post cholecystectomy.

## 2023-09-17 ENCOUNTER — Emergency Department (HOSPITAL_COMMUNITY)

## 2023-09-17 ENCOUNTER — Inpatient Hospital Stay (HOSPITAL_COMMUNITY)
Admission: EM | Admit: 2023-09-17 | Discharge: 2023-09-24 | DRG: 381 | Disposition: A | Discharge status: Home / Self Care | Attending: Internal Medicine | Admitting: Internal Medicine

## 2023-09-17 DIAGNOSIS — Z8249 Family history of ischemic heart disease and other diseases of the circulatory system: Secondary | ICD-10-CM

## 2023-09-17 DIAGNOSIS — Z66 Do not resuscitate: Secondary | ICD-10-CM | POA: Diagnosis present

## 2023-09-17 DIAGNOSIS — D62 Acute posthemorrhagic anemia: Secondary | ICD-10-CM | POA: Diagnosis present

## 2023-09-17 DIAGNOSIS — Z86718 Personal history of other venous thrombosis and embolism: Secondary | ICD-10-CM

## 2023-09-17 DIAGNOSIS — E1169 Type 2 diabetes mellitus with other specified complication: Secondary | ICD-10-CM | POA: Diagnosis present

## 2023-09-17 DIAGNOSIS — E875 Hyperkalemia: Secondary | ICD-10-CM | POA: Diagnosis present

## 2023-09-17 DIAGNOSIS — K922 Gastrointestinal hemorrhage, unspecified: Secondary | ICD-10-CM

## 2023-09-17 DIAGNOSIS — K8681 Exocrine pancreatic insufficiency: Secondary | ICD-10-CM | POA: Diagnosis present

## 2023-09-17 DIAGNOSIS — R109 Unspecified abdominal pain: Secondary | ICD-10-CM | POA: Diagnosis not present

## 2023-09-17 DIAGNOSIS — Z800 Family history of malignant neoplasm of digestive organs: Secondary | ICD-10-CM

## 2023-09-17 DIAGNOSIS — E0865 Diabetes mellitus due to underlying condition with hyperglycemia: Secondary | ICD-10-CM | POA: Diagnosis present

## 2023-09-17 DIAGNOSIS — I129 Hypertensive chronic kidney disease with stage 1 through stage 4 chronic kidney disease, or unspecified chronic kidney disease: Secondary | ICD-10-CM | POA: Diagnosis present

## 2023-09-17 DIAGNOSIS — I9589 Other hypotension: Secondary | ICD-10-CM | POA: Diagnosis present

## 2023-09-17 DIAGNOSIS — I959 Hypotension, unspecified: Secondary | ICD-10-CM | POA: Diagnosis not present

## 2023-09-17 DIAGNOSIS — K921 Melena: Secondary | ICD-10-CM | POA: Diagnosis not present

## 2023-09-17 DIAGNOSIS — K219 Gastro-esophageal reflux disease without esophagitis: Secondary | ICD-10-CM | POA: Diagnosis not present

## 2023-09-17 DIAGNOSIS — R55 Syncope and collapse: Secondary | ICD-10-CM | POA: Diagnosis present

## 2023-09-17 DIAGNOSIS — Z1152 Encounter for screening for COVID-19: Secondary | ICD-10-CM | POA: Diagnosis not present

## 2023-09-17 DIAGNOSIS — Z79899 Other long term (current) drug therapy: Secondary | ICD-10-CM | POA: Diagnosis not present

## 2023-09-17 DIAGNOSIS — Z21 Asymptomatic human immunodeficiency virus [HIV] infection status: Secondary | ICD-10-CM | POA: Diagnosis present

## 2023-09-17 DIAGNOSIS — Z885 Allergy status to narcotic agent status: Secondary | ICD-10-CM

## 2023-09-17 DIAGNOSIS — Z860101 Personal history of adenomatous and serrated colon polyps: Secondary | ICD-10-CM | POA: Diagnosis not present

## 2023-09-17 DIAGNOSIS — E871 Hypo-osmolality and hyponatremia: Secondary | ICD-10-CM | POA: Diagnosis present

## 2023-09-17 DIAGNOSIS — K2100 Gastro-esophageal reflux disease with esophagitis, without bleeding: Secondary | ICD-10-CM

## 2023-09-17 DIAGNOSIS — T380X5A Adverse effect of glucocorticoids and synthetic analogues, initial encounter: Secondary | ICD-10-CM | POA: Diagnosis present

## 2023-09-17 DIAGNOSIS — E089 Diabetes mellitus due to underlying condition without complications: Secondary | ICD-10-CM | POA: Diagnosis present

## 2023-09-17 DIAGNOSIS — E272 Addisonian crisis: Secondary | ICD-10-CM | POA: Diagnosis present

## 2023-09-17 DIAGNOSIS — K2211 Ulcer of esophagus with bleeding: Secondary | ICD-10-CM | POA: Diagnosis present

## 2023-09-17 DIAGNOSIS — K861 Other chronic pancreatitis: Secondary | ICD-10-CM | POA: Diagnosis present

## 2023-09-17 DIAGNOSIS — K208 Other esophagitis without bleeding: Secondary | ICD-10-CM | POA: Diagnosis not present

## 2023-09-17 DIAGNOSIS — Z9049 Acquired absence of other specified parts of digestive tract: Secondary | ICD-10-CM

## 2023-09-17 DIAGNOSIS — Z7984 Long term (current) use of oral hypoglycemic drugs: Secondary | ICD-10-CM

## 2023-09-17 DIAGNOSIS — R61 Generalized hyperhidrosis: Secondary | ICD-10-CM | POA: Diagnosis present

## 2023-09-17 DIAGNOSIS — E119 Type 2 diabetes mellitus without complications: Secondary | ICD-10-CM | POA: Diagnosis not present

## 2023-09-17 DIAGNOSIS — Z860100 Personal history of colon polyps, unspecified: Secondary | ICD-10-CM | POA: Diagnosis not present

## 2023-09-17 DIAGNOSIS — N1831 Chronic kidney disease, stage 3a: Secondary | ICD-10-CM | POA: Diagnosis present

## 2023-09-17 DIAGNOSIS — N189 Chronic kidney disease, unspecified: Secondary | ICD-10-CM | POA: Diagnosis present

## 2023-09-17 DIAGNOSIS — Z794 Long term (current) use of insulin: Secondary | ICD-10-CM

## 2023-09-17 DIAGNOSIS — D649 Anemia, unspecified: Secondary | ICD-10-CM | POA: Diagnosis not present

## 2023-09-17 DIAGNOSIS — I864 Gastric varices: Secondary | ICD-10-CM

## 2023-09-17 DIAGNOSIS — I8500 Esophageal varices without bleeding: Secondary | ICD-10-CM | POA: Diagnosis not present

## 2023-09-17 DIAGNOSIS — K209 Esophagitis, unspecified without bleeding: Secondary | ICD-10-CM | POA: Diagnosis not present

## 2023-09-17 DIAGNOSIS — E876 Hypokalemia: Secondary | ICD-10-CM | POA: Diagnosis not present

## 2023-09-17 DIAGNOSIS — K2081 Other esophagitis with bleeding: Secondary | ICD-10-CM | POA: Diagnosis not present

## 2023-09-17 DIAGNOSIS — Z5941 Food insecurity: Secondary | ICD-10-CM

## 2023-09-17 DIAGNOSIS — N179 Acute kidney failure, unspecified: Secondary | ICD-10-CM | POA: Diagnosis present

## 2023-09-17 DIAGNOSIS — R7989 Other specified abnormal findings of blood chemistry: Secondary | ICD-10-CM | POA: Diagnosis not present

## 2023-09-17 DIAGNOSIS — R110 Nausea: Secondary | ICD-10-CM | POA: Diagnosis not present

## 2023-09-17 HISTORY — DX: Chronic kidney disease, unspecified: N18.9

## 2023-09-17 LAB — COMPREHENSIVE METABOLIC PANEL
ALT: 34 U/L (ref 0–44)
AST: 31 U/L (ref 15–41)
Albumin: 3 g/dL — ABNORMAL LOW (ref 3.5–5.0)
Alkaline Phosphatase: 45 U/L (ref 38–126)
Anion gap: 11 (ref 5–15)
BUN: 67 mg/dL — ABNORMAL HIGH (ref 6–20)
CO2: 21 mmol/L — ABNORMAL LOW (ref 22–32)
Calcium: 8.4 mg/dL — ABNORMAL LOW (ref 8.9–10.3)
Chloride: 101 mmol/L (ref 98–111)
Creatinine, Ser: 1.92 mg/dL — ABNORMAL HIGH (ref 0.61–1.24)
GFR, Estimated: 39 mL/min — ABNORMAL LOW (ref >60)
Glucose, Bld: 219 mg/dL — ABNORMAL HIGH (ref 70–99)
Potassium: 5.8 mmol/L — ABNORMAL HIGH (ref 3.5–5.1)
Sodium: 133 mmol/L — ABNORMAL LOW (ref 135–145)
Total Bilirubin: 0.5 mg/dL (ref 0.0–1.2)
Total Protein: 5.1 g/dL — ABNORMAL LOW (ref 6.5–8.1)

## 2023-09-17 LAB — URINALYSIS, W/ REFLEX TO CULTURE (INFECTION SUSPECTED)
Bacteria, UA: NONE SEEN
Bilirubin Urine: NEGATIVE
Glucose, UA: 50 mg/dL — AB
Hgb urine dipstick: NEGATIVE
Ketones, ur: NEGATIVE mg/dL
Leukocytes,Ua: NEGATIVE
Nitrite: NEGATIVE
Protein, ur: NEGATIVE mg/dL
Specific Gravity, Urine: 1.019 (ref 1.005–1.030)
pH: 5 (ref 5.0–8.0)

## 2023-09-17 LAB — CBC WITH DIFFERENTIAL/PLATELET
Abs Immature Granulocytes: 0.1 10*3/uL — ABNORMAL HIGH (ref 0.00–0.07)
Basophils Absolute: 0 10*3/uL (ref 0.0–0.1)
Basophils Relative: 0 %
Eosinophils Absolute: 0 10*3/uL (ref 0.0–0.5)
Eosinophils Relative: 0 %
HCT: 25.1 % — ABNORMAL LOW (ref 39.0–52.0)
Hemoglobin: 8.7 g/dL — ABNORMAL LOW (ref 13.0–17.0)
Immature Granulocytes: 1 %
Lymphocytes Relative: 8 %
Lymphs Abs: 0.9 10*3/uL (ref 0.7–4.0)
MCH: 31 pg (ref 26.0–34.0)
MCHC: 34.7 g/dL (ref 30.0–36.0)
MCV: 89.3 fL (ref 80.0–100.0)
Monocytes Absolute: 0.8 10*3/uL (ref 0.1–1.0)
Monocytes Relative: 7 %
Neutro Abs: 8.6 10*3/uL — ABNORMAL HIGH (ref 1.7–7.7)
Neutrophils Relative %: 84 %
Platelets: 218 10*3/uL (ref 150–400)
RBC: 2.81 MIL/uL — ABNORMAL LOW (ref 4.22–5.81)
RDW: 13.3 % (ref 11.5–15.5)
WBC: 10.4 10*3/uL (ref 4.0–10.5)
nRBC: 0 % (ref 0.0–0.2)

## 2023-09-17 LAB — TSH: TSH: 1.809 u[IU]/mL (ref 0.350–4.500)

## 2023-09-17 LAB — RESP PANEL BY RT-PCR (RSV, FLU A&B, COVID)  RVPGX2
Influenza A by PCR: NEGATIVE
Influenza B by PCR: NEGATIVE
Resp Syncytial Virus by PCR: NEGATIVE
SARS Coronavirus 2 by RT PCR: NEGATIVE

## 2023-09-17 LAB — LIPASE, BLOOD: Lipase: 19 U/L (ref 11–51)

## 2023-09-17 LAB — I-STAT CG4 LACTIC ACID, ED
Lactic Acid, Venous: 2.4 mmol/L (ref 0.5–1.9)
Lactic Acid, Venous: 1.5 mmol/L (ref 0.5–1.9)

## 2023-09-17 LAB — CBG MONITORING, ED: Glucose-Capillary: 187 mg/dL — ABNORMAL HIGH (ref 70–99)

## 2023-09-17 MED ORDER — LACTATED RINGERS IV BOLUS
1000.0000 mL | Freq: Once | INTRAVENOUS | Status: AC
  Administered 2023-09-17: 1000 mL via INTRAVENOUS

## 2023-09-17 MED ORDER — SODIUM ZIRCONIUM CYCLOSILICATE 10 G PO PACK
10.0000 g | PACK | Freq: Once | ORAL | Status: AC
  Administered 2023-09-17: 10 g via ORAL
  Filled 2023-09-17: qty 1

## 2023-09-17 MED ORDER — HEPARIN SODIUM (PORCINE) 5000 UNIT/ML IJ SOLN
5000.0000 [IU] | Freq: Three times a day (TID) | INTRAMUSCULAR | Status: DC
  Administered 2023-09-17 – 2023-09-18 (×2): 5000 [IU] via SUBCUTANEOUS
  Filled 2023-09-17 (×2): qty 1

## 2023-09-17 MED ORDER — ACETAMINOPHEN 650 MG RE SUPP: 650.0000 mg | Freq: Four times a day (QID) | RECTAL | Status: DC | PRN

## 2023-09-17 MED ORDER — ONDANSETRON HCL 4 MG PO TABS
4.0000 mg | ORAL_TABLET | Freq: Four times a day (QID) | ORAL | Status: DC | PRN
  Administered 2023-09-20: 4 mg via ORAL
  Filled 2023-09-17: qty 1

## 2023-09-17 MED ORDER — SENNOSIDES-DOCUSATE SODIUM 8.6-50 MG PO TABS: 1.0000 | ORAL_TABLET | Freq: Every evening | ORAL | Status: DC | PRN

## 2023-09-17 MED ORDER — VANCOMYCIN HCL 2000 MG/400ML IV SOLN
2000.0000 mg | Freq: Once | INTRAVENOUS | Status: AC
  Administered 2023-09-17: 2000 mg via INTRAVENOUS
  Filled 2023-09-17: qty 400

## 2023-09-17 MED ORDER — SODIUM CHLORIDE 0.9 % IV BOLUS
500.0000 mL | Freq: Once | INTRAVENOUS | Status: AC
  Administered 2023-09-17: 500 mL via INTRAVENOUS

## 2023-09-17 MED ORDER — HYDROCODONE-ACETAMINOPHEN 5-325 MG PO TABS
1.0000 | ORAL_TABLET | ORAL | Status: DC | PRN
  Administered 2023-09-18 (×2): 1 via ORAL
  Administered 2023-09-19: 2 via ORAL
  Administered 2023-09-20: 1 via ORAL
  Administered 2023-09-20 (×2): 2 via ORAL
  Filled 2023-09-17: qty 1
  Filled 2023-09-17: qty 2
  Filled 2023-09-17 (×2): qty 1
  Filled 2023-09-17 (×2): qty 2

## 2023-09-17 MED ORDER — HYDROCORTISONE SOD SUC (PF) 100 MG IJ SOLR
100.0000 mg | Freq: Every day | INTRAMUSCULAR | Status: DC
  Administered 2023-09-17: 100 mg via INTRAVENOUS
  Filled 2023-09-17: qty 2

## 2023-09-17 MED ORDER — INSULIN ASPART 100 UNIT/ML IJ SOLN
0.0000 [IU] | Freq: Three times a day (TID) | INTRAMUSCULAR | Status: DC
  Administered 2023-09-18: 4 [IU] via SUBCUTANEOUS
  Administered 2023-09-18 (×2): 7 [IU] via SUBCUTANEOUS
  Administered 2023-09-19 (×2): 4 [IU] via SUBCUTANEOUS
  Administered 2023-09-20 (×2): 3 [IU] via SUBCUTANEOUS
  Administered 2023-09-22 – 2023-09-23 (×3): 4 [IU] via SUBCUTANEOUS

## 2023-09-17 MED ORDER — INSULIN ASPART 100 UNIT/ML IJ SOLN
0.0000 [IU] | Freq: Every day | INTRAMUSCULAR | Status: DC
  Administered 2023-09-18 – 2023-09-22 (×2): 2 [IU] via SUBCUTANEOUS

## 2023-09-17 MED ORDER — SODIUM CHLORIDE 0.9 % IV SOLN
2.0000 g | Freq: Once | INTRAVENOUS | Status: AC
  Administered 2023-09-17: 2 g via INTRAVENOUS
  Filled 2023-09-17: qty 12.5

## 2023-09-17 MED ORDER — INSULIN GLARGINE 100 UNIT/ML ~~LOC~~ SOLN
12.0000 [IU] | Freq: Every day | SUBCUTANEOUS | Status: DC
  Administered 2023-09-18 – 2023-09-23 (×6): 12 [IU] via SUBCUTANEOUS
  Filled 2023-09-17 (×9): qty 0.12

## 2023-09-17 MED ORDER — ONDANSETRON HCL 4 MG/2ML IJ SOLN
4.0000 mg | Freq: Four times a day (QID) | INTRAMUSCULAR | Status: DC | PRN
  Administered 2023-09-21 – 2023-09-23 (×3): 4 mg via INTRAVENOUS
  Filled 2023-09-17 (×3): qty 2

## 2023-09-17 MED ORDER — PANCRELIPASE (LIP-PROT-AMYL) 36000-114000 UNITS PO CPEP
72000.0000 [IU] | ORAL_CAPSULE | Freq: Three times a day (TID) | ORAL | Status: DC
  Administered 2023-09-18 – 2023-09-24 (×15): 72000 [IU] via ORAL
  Filled 2023-09-17 (×17): qty 2

## 2023-09-17 MED ORDER — SODIUM CHLORIDE 0.9% FLUSH
3.0000 mL | Freq: Two times a day (BID) | INTRAVENOUS | Status: DC
  Administered 2023-09-17 – 2023-09-24 (×13): 3 mL via INTRAVENOUS

## 2023-09-17 MED ORDER — PANCRELIPASE (LIP-PROT-AMYL) 36000-114000 UNITS PO CPEP: 72000.0000 [IU] | ORAL_CAPSULE | Freq: Three times a day (TID) | ORAL | Status: DC

## 2023-09-17 MED ORDER — SODIUM CHLORIDE 0.9 % IV SOLN: INTRAVENOUS | Status: AC

## 2023-09-17 MED ORDER — HYDROCORTISONE SOD SUC (PF) 100 MG IJ SOLR
50.0000 mg | Freq: Three times a day (TID) | INTRAMUSCULAR | Status: DC
  Administered 2023-09-18 (×2): 50 mg via INTRAVENOUS
  Filled 2023-09-17 (×2): qty 2

## 2023-09-17 MED ORDER — ACETAMINOPHEN 325 MG PO TABS
650.0000 mg | ORAL_TABLET | Freq: Four times a day (QID) | ORAL | Status: DC | PRN
  Administered 2023-09-21 – 2023-09-23 (×3): 650 mg via ORAL
  Filled 2023-09-17 (×3): qty 2

## 2023-09-17 NOTE — H&P (Signed)
 History and Physical    Allen Russell BJY:[id number] DOB: 31-Dec-1960 DOA: 09/17/2023  PCP: [name], PA-C  Patient coming from: Home  I have personally briefly reviewed patient's old medical records in [facility]  Chief Complaint: Low blood pressure, syncope  HPI: Allen Russell is a 63 y.o. 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  Patient states that last night he began to feel like he was having low blood pressure.  He has been lightheaded and fatigued.  He says that since yesterday he has had a few syncopal episodes when getting up and walking.  He has not suffered any obvious injury during his episodes.  He has been diaphoretic but denies subjective fevers or chills.  He has noticed intermittent palpitations but no chest pain.  He denies shortness of breath, nausea, vomiting.  He has had some lower abdominal pain and noticed some trouble starting urination.  He has not had any burning or pain when urinating.  He denies any diarrhea.  Patient reports he hurt his lower back a couple weeks ago and has not been as active as usual.  He denies any recent medication changes.  Patient had a prior admission from September-October 2022 for undifferentiated shock felt secondary to adrenal insufficiency with low basal cortisol.  He required pressor support.  Hospitalization was complicated by DKA due to treatment with steroids.  He was discharged on a slow steroid taper and has been off steroids for quite a while.  He had normal cortisol and ACTH testing with his endocrinologist in August 2024.  ED Course  Labs/Imaging on admission: I have personally reviewed  following labs and imaging studies.  Initial vitals showed BP 92/55, pulse 80, RR 14, temp 97.7 F, SpO2 100% on room air.  Labs showed WBC 10.4, hemoglobin 8.7, platelets 218,000, sodium 133, potassium 5.8, bicarb 21, BUN 67, creatinine 1.92, serum glucose 219, LFTs within normal limits, lipase 19, TSH 1.809, lactic acid 2.4 > 1.5.  SARS-CoV-2, influenza, RSV PCR negative.  UA negative for UTI.  Blood cultures in process.  Portable chest x-ray negative for focal consolidation, edema, effusion.  Patient was given 1.5 L IV fluid boluses, IV vancomycin and cefepime, IV Solu-Cortef 100 mg.  The hospitalist service was consulted to admit.  Review of Systems: All systems reviewed and are negative except as documented in history of present illness above.   Past Medical History:  Diagnosis Date   'light-for-dates' infant with signs of fetal malnutrition 04/12/2021   Adrenal insufficiency (HCC) 04/12/2021   Diabetes mellitus without complication (HCC)    Ketoacidosis 03/21/2021   Shock (HCC)     Past Surgical History:  Procedure Laterality Date   APPENDECTOMY     BACK SURGERY     CHOLECYSTECTOMY     HERNIA REPAIR      Social History: He denies alcohol or tobacco use.  He denies any illicit drug use.  Allergies  Allergen Reactions   Morphine Other (See Comments)    headache      Family History  Problem Relation Age of Onset   Breast cancer Mother    Thyroid disease Mother    Breast cancer Sister    Pancreatic cancer Maternal Grandmother    Ovarian cancer  Maternal Grandmother    Hypertension Paternal Grandfather      Prior to Admission medications   Medication Sig Start Date End Date Taking? Authorizing Provider  Blood Glucose Monitoring Suppl (TRUE METRIX METER) w/Device KIT Use up to four times daily as directed. 04/07/21   Arrien, York Ram, MD  diphenhydrAMINE (BENADRYL) 25 MG tablet Take 25 mg by mouth every 6 (six) hours as needed for allergies.    [provider]  gabapentin (NEURONTIN) 300 MG capsule Take 1 capsule (300 mg total) by mouth daily. 10/11/21 04/21/22  Steffanie Rainwater, MD  glucose blood (TRUE METRIX BLOOD GLUCOSE TEST) test strip Use as directed to test blood sugar 08/11/21   Steffanie Rainwater, MD  Insulin Pen Needle 32G X 4 MM MISC Use 1 as directed in the morning, at noon, in the evening, and at bedtime (4 times a day) with insulin 05/30/21   Evlyn Kanner, MD  lipase/protease/amylase (CREON) 36000 UNITS CPEP capsule Take 1 capsule (36,000 Units total) by mouth 3 (three) times daily with meals. 09/16/21   Steffanie Rainwater, MD  metFORMIN (GLUCOPHAGE) 1000 MG tablet Take 1 tablet (1,000 mg total) by mouth 2 (two) times daily with a meal. 12/13/21   Steffanie Rainwater, MD  Multiple Vitamin (MULTIVITAMIN) tablet Take 1 tablet by mouth daily.    [provider]  pregabalin (LYRICA) 50 MG capsule Take 1 capsule (50 mg total) by mouth 2 (two) times daily. 10/11/21 02/08/22  Steffanie Rainwater, MD  ranitidine (ZANTAC) 150 MG tablet Take 150 mg by mouth daily as needed for heartburn.    [provider]  TRUEplus Lancets 28G MISC Use as directed to test blood sugar 06/28/21   Dolan Amen, MD    Physical Exam: Vitals:   09/17/23 2200 09/17/23 2215 09/17/23 2230 09/17/23 2245  BP: 102/67 107/64 105/67 113/77  Pulse: 81 86 87 92  Resp: 17 16 (!) 21 17  Temp:      TempSrc:      SpO2: 100% 100% 100% 100%   Constitutional: Resting supine in bed, diaphoretic Eyes: EOMI, lids and conjunctivae normal ENMT: Mucous membranes are dry. Posterior pharynx clear of any exudate or lesions.Normal dentition.  Neck: normal, supple, no masses. Respiratory: clear to auscultation bilaterally, no wheezing, no crackles. Normal respiratory effort. No accessory muscle use.  Cardiovascular: Regular rate and rhythm, no murmurs / rubs / gallops. No extremity edema. 2+ pedal pulses. Abdomen: no tenderness, no masses  palpated. Musculoskeletal: no clubbing / cyanosis. No joint deformity upper and lower extremities. Good ROM, no contractures. Normal muscle tone.  Skin: Diaphoretic.  No rashes, lesions, ulcers. No induration Neurologic: Sensation intact. Strength 5/5 in all 4.  Psychiatric: Normal judgment and insight. Alert and oriented x 3. Normal mood.   EKG: Personally reviewed. Sinus rhythm, rate 84, no acute ischemic changes, low voltage  Assessment/Plan Principal Problem:   Adrenal crisis (HCC) Active Problems:   Acute kidney injury superimposed on chronic kidney disease (HCC)   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)   Hypotension   Hyperkalemia   Hyponatremia   Chronic pancreatitis (HCC)   Virat L Magan is a 63 y.o. 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  Assessment and Plan: Hypotension due to possible adrenal crisis: Patient with prior history of adrenal insufficiency in late 2022 with low basal cortisol.  Has been off steroids for years.  Now presenting with syncopal episodes due to hypotension from suspected adrenal insufficiency.  He has hyperkalemia and mild hyponatremia.  There is no obvious infectious process but he did receive IV antibiotics while in the ED. -S/p IV Solu-Cortef 100 mg once, continue 50 mg every 8 hours -Continue IV fluid hydration overnight -Check cortisol, aldosterone/renin levels (could not be added on labs prior to receiving steroids)  Acute kidney injury superimposed on CKD stage IIIa: Suspect due to hypotensive episodes.  Continue IV fluid hydration.  Monitor UOP and repeat  labs in AM.  Hyperkalemia: Could be secondary to AKI however given clinical picture hypoaldosteronism also on differential.  Give 1 dose of Lokelma now.  Continue IV normal saline.  Hyponatremia: Mild in setting of hypovolemia versus possible hyperaldosteronism.  Continue IV fluids and monitor labs.  Normocytic anemia: Hemoglobin is slightly lower than previous.  No obvious bleeding.  Check anemia panel.  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: Continue Lantus 12 units nightly.  Add resistance scale SSI given steroid use.  Chronic pancreatitis with prior history of necrotizing pancreatitis: Continue Creon.   DVT prophylaxis: heparin injection 5,000 Units Start: 09/17/23 2330 Code Status:   Code Status: Do not attempt resuscitation (DNR) PRE-ARREST INTERVENTIONS DESIRED confirmed with patient on admission. Family Communication: Discussed with patient, he has discussed with family Disposition Plan: From home, dispo pending clinical progress Consults called: None Severity of Illness: The appropriate patient status for this patient is INPATIENT. Inpatient status is judged to be reasonable and necessary in order to provide the required intensity of service to ensure the patient's safety. The patient's presenting symptoms, physical exam findings, and initial radiographic and laboratory data in the context of their chronic comorbidities is felt to place them at high risk for further clinical deterioration. Furthermore, it is not anticipated that the patient will be medically stable for discharge from the hospital within 2 midnights of admission.   * I certify that at the point of admission it is my clinical judgment that the patient will require inpatient hospital care spanning beyond 2 midnights from the point of admission due to high intensity of service, high risk for further deterioration and high frequency of surveillance required.Darreld Mclean MD [facility]  If 7PM-7AM, please contact night-coverage [email or web address]  09/17/2023, 11:46 PM

## 2023-09-17 NOTE — ED Provider Notes (Signed)
[place] EMERGENCY DEPARTMENT AT [facility] Provider Note   CSN: [id number] Arrival date & time: 09/17/23  1639     History Chief Complaint  Patient presents with   Hypotension    HPI Allen Russell is a 63 y.o. male presenting for weakness and fatigue. Ambulatory and tolerating PO intake. Patient's recorded medical, surgical, social, medication list and allergies were reviewed in the Snapshot window as part of the initial history.   Review of Systems   Review of Systems  Constitutional:  Positive for fatigue. Negative for chills and fever.  HENT:  Negative for ear pain and sore throat.   Eyes:  Negative for pain and visual disturbance.  Respiratory:  Negative for cough and shortness of breath.   Cardiovascular:  Negative for chest pain and palpitations.  Gastrointestinal:  Negative for abdominal pain and vomiting.  Genitourinary:  Negative for dysuria and hematuria.  Musculoskeletal:  Positive for myalgias. Negative for arthralgias and back pain.  Skin:  Negative for color change and rash.  Neurological:  Positive for weakness. Negative for seizures and syncope.  All other systems reviewed and are negative.   Physical Exam Updated Vital Signs BP 113/77   Pulse 92   Temp 98.3 F (36.8 C) (Oral)   Resp 17   SpO2 100%  Physical Exam Vitals and nursing note reviewed.  Constitutional:      General: He is not in acute distress.    Appearance: He is well-developed.  HENT:     Head: Normocephalic and atraumatic.  Eyes:     Conjunctiva/sclera: Conjunctivae normal.  Cardiovascular:     Rate and Rhythm: Normal rate and regular rhythm.     Heart sounds: No murmur heard. Pulmonary:     Effort: Pulmonary effort is normal. No respiratory distress.     Breath sounds: Normal breath sounds.  Abdominal:     Palpations: Abdomen is soft.     Tenderness: There is no abdominal tenderness.  Musculoskeletal:        General: No swelling.     Cervical back: Neck supple.   Skin:    General: Skin is warm and dry.     Capillary Refill: Capillary refill takes less than 2 seconds.  Neurological:     Mental Status: He is alert.  Psychiatric:        Mood and Affect: Mood normal.      ED Course/ Medical Decision Making/ A&P    Procedures Procedures   Medications Ordered in ED Medications  hydrocortisone sodium succinate (SOLU-CORTEF) 100 MG injection 100 mg (100 mg Intravenous Given 09/17/23 1852)  sodium chloride 0.9 % bolus 500 mL (0 mLs Intravenous Stopped 09/17/23 1837)  ceFEPIme (MAXIPIME) 2 g in sodium chloride 0.9 % 100 mL IVPB (0 g Intravenous Stopped 09/17/23 1924)  vancomycin (VANCOREADY) IVPB 2000 mg/400 mL (0 mg Intravenous Stopped 09/17/23 2128)  lactated ringers bolus 1,000 mL (0 mLs Intravenous Stopped 09/17/23 2034)   Medical Decision Making:   Allen Russell is a 63 y.o. male who presented to the ED today with multiple symptoms detailed above.    Patient placed on continuous vitals and telemetry monitoring while in ED which was reviewed periodically.  Complete initial physical exam performed, notably the patient  was ill-appearing. During this initial exam, patient met criteria for activation of code sepsis due to presence of the following SIRS criteria as well as suspected infectious etiology: tachypnea,  tachycardia, fever, triage CBC with leukocytosis. Reviewed and confirmed nursing documentation  for past medical history, family history, social history.    Initial Assessment:   With the patient's presentation of signs and symptoms of sepsis, most likely diagnosis is bacteremia secondary to underlying infection.  Considerations for source were initiated including:Urinary tract infections, abdominal infections such as cholecystitis/cholangitis/appendicitis, pulmonary etiology, bacteremia, skin etiology such as cellulitis or fasciitis, neurologic etiology such as meningitis or encephalitis.  This is most consistent with an acute life/limb  threatening illness complicated by underlying chronic conditions.  Initial Plan:  Activated hospital protocol code sepsis including blood cultures, lactic acid screening, and further diagnostic care and management. Therapeutically, resuscitation fluids were considered. Patient has no contraindication to fluid resuscitation and therefore 30 cc of IV fluids per kilogram were administered Therapeutically, antibiotics were administered on a broad-spectrum nature. Undifferentiated source: Vancomycin and cefepime were administered to cover gram-positive and gram-negative high risk infections Screening labs including CBC and Metabolic panel to evaluate for infectious or metabolic etiology of disease.  Urinalysis with reflex culture ordered to evaluate for UTI or relevant urologic/nephrologic pathology.  CXR to evaluate for structural/infectious intrathoracic pathology.  EKG to evaluate for cardiac pathology Objective evaluation as below reviewed   Initial Study Results:   Laboratory  All laboratory results reviewed without evidence of clinically relevant pathology.    EKG EKG was reviewed independently. Rate, rhythm, axis, intervals all examined and without medically relevant abnormality. ST segments without concerns for elevations.    Radiology:  All images reviewed independently. Agree with radiology report at this time.   DG Chest Portable 1 View Result Date: 09/17/2023 CLINICAL DATA:  Altered level of consciousness, hypotension EXAM: PORTABLE CHEST 1 VIEW COMPARISON:  03/24/2021 FINDINGS: The heart size and mediastinal contours are within normal limits. Both lungs are clear. The visualized skeletal structures are unremarkable. IMPRESSION: No active disease. Electronically Signed   By: Sharlet Salina M.D.   On: 09/17/2023 17:50    Final Assessment and Plan:   HPI and PE findings reveal no acute pathology.  However he met sepsis protocol due to his hypertension, positive lactic acid and  tachypnea. Uncertain underlying source. Chest x-ray and urine studies without acute pathology.  He has been broadly treated and has stabilized with Solu-Cortef.  Consulted hospitalist for further care management and stabilization overnight. Disposition:   Based on the above findings, I believe this patient is stable for admission.    Patient/family educated about specific findings on our evaluation and explained exact reasons for admission.  Patient/family educated about clinical situation and time was allowed to answer questions.   Admission team communicated with and agreed with need for admission. Patient admitted. Patient ready to move at this time.     Emergency Department Medication Summary:   Medications  hydrocortisone sodium succinate (SOLU-CORTEF) 100 MG injection 100 mg (100 mg Intravenous Given 09/17/23 1852)  sodium chloride 0.9 % bolus 500 mL (0 mLs Intravenous Stopped 09/17/23 1837)  ceFEPIme (MAXIPIME) 2 g in sodium chloride 0.9 % 100 mL IVPB (0 g Intravenous Stopped 09/17/23 1924)  vancomycin (VANCOREADY) IVPB 2000 mg/400 mL (0 mg Intravenous Stopped 09/17/23 2128)  lactated ringers bolus 1,000 mL (0 mLs Intravenous Stopped 09/17/23 2034)         Clinical Impression:  1. Hypotensive episode      Admit   Final Clinical Impression(s) / ED Diagnoses Final diagnoses:  Hypotensive episode    Rx / DC Orders ED Discharge Orders     None         Glyn Ade, MD  09/17/23 2318  

## 2023-09-17 NOTE — Hospital Course (Signed)
 Allen Russell is a 63 y.o. 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

## 2023-09-17 NOTE — Progress Notes (Signed)
 ED Pharmacy Antibiotic Sign Off An antibiotic consult was received from an ED provider for vancomycin per pharmacy dosing for sepsis. A chart review was completed to assess appropriateness.   The following one time order(s) were placed:  Vancomycin 2000 mg IV   Further antibiotic and/or antibiotic pharmacy consults should be ordered by the admitting provider if indicated.   Thank you for allowing pharmacy to be a part of this patient's care.   Lennie Muckle, PharmD PGY1 Pharmacy Resident 09/17/2023 6:08 PM

## 2023-09-17 NOTE — ED Triage Notes (Addendum)
 Pt BIB [facility] for hypotension. Called out for syncope/diabetic problem. Pt hypotensive upon EMS arrival. Pressures in the 80s Given 600 ML of NS. Pt endorses possible syncope, unsure of headstrike.

## 2023-09-18 ENCOUNTER — Other Ambulatory Visit: Payer: Self-pay

## 2023-09-18 ENCOUNTER — Encounter (HOSPITAL_COMMUNITY): Payer: Self-pay | Admitting: Internal Medicine

## 2023-09-18 DIAGNOSIS — R55 Syncope and collapse: Secondary | ICD-10-CM | POA: Diagnosis present

## 2023-09-18 DIAGNOSIS — I959 Hypotension, unspecified: Secondary | ICD-10-CM

## 2023-09-18 DIAGNOSIS — R110 Nausea: Secondary | ICD-10-CM

## 2023-09-18 DIAGNOSIS — R7989 Other specified abnormal findings of blood chemistry: Secondary | ICD-10-CM | POA: Diagnosis not present

## 2023-09-18 DIAGNOSIS — D62 Acute posthemorrhagic anemia: Secondary | ICD-10-CM | POA: Diagnosis present

## 2023-09-18 DIAGNOSIS — K921 Melena: Secondary | ICD-10-CM | POA: Diagnosis not present

## 2023-09-18 LAB — CBC WITH DIFFERENTIAL/PLATELET
Abs Immature Granulocytes: 0 10*3/uL (ref 0.00–0.07)
Basophils Absolute: 0.2 10*3/uL — ABNORMAL HIGH (ref 0.0–0.1)
Basophils Relative: 2 %
Eosinophils Absolute: 0 10*3/uL (ref 0.0–0.5)
Eosinophils Relative: 0 %
HCT: 18.5 % — ABNORMAL LOW (ref 39.0–52.0)
Hemoglobin: 6.5 g/dL — CL (ref 13.0–17.0)
Lymphocytes Relative: 34 %
Lymphs Abs: 2.9 10*3/uL (ref 0.7–4.0)
MCH: 30.5 pg (ref 26.0–34.0)
MCHC: 35.1 g/dL (ref 30.0–36.0)
MCV: 86.9 fL (ref 80.0–100.0)
Monocytes Absolute: 0.6 10*3/uL (ref 0.1–1.0)
Monocytes Relative: 7 %
Neutro Abs: 4.9 10*3/uL (ref 1.7–7.7)
Neutrophils Relative %: 57 %
Platelets: 174 10*3/uL (ref 150–400)
RBC: 2.13 MIL/uL — ABNORMAL LOW (ref 4.22–5.81)
RDW: 13.9 % (ref 11.5–15.5)
Smear Review: NORMAL
WBC: 8.6 10*3/uL (ref 4.0–10.5)
nRBC: 0 % (ref 0.0–0.2)

## 2023-09-18 LAB — CBC
HCT: 16.2 % — ABNORMAL LOW (ref 39.0–52.0)
Hemoglobin: 5.4 g/dL — CL (ref 13.0–17.0)
MCH: 30.2 pg (ref 26.0–34.0)
MCHC: 33.3 g/dL (ref 30.0–36.0)
MCV: 90.5 fL (ref 80.0–100.0)
Platelets: 153 10*3/uL (ref 150–400)
RBC: 1.79 MIL/uL — ABNORMAL LOW (ref 4.22–5.81)
RDW: 13.6 % (ref 11.5–15.5)
WBC: 5.7 10*3/uL (ref 4.0–10.5)

## 2023-09-18 LAB — IRON AND TIBC
Iron: 48 ug/dL (ref 45–182)
Saturation Ratios: 20 % (ref 17.9–39.5)
TIBC: 246 ug/dL — ABNORMAL LOW (ref 250–450)
UIBC: 198 ug/dL

## 2023-09-18 LAB — FERRITIN: Ferritin: 14 ng/mL — ABNORMAL LOW (ref 24–336)

## 2023-09-18 LAB — BASIC METABOLIC PANEL
Anion gap: 6 (ref 5–15)
BUN: 65 mg/dL — ABNORMAL HIGH (ref 6–20)
CO2: 20 mmol/L — ABNORMAL LOW (ref 22–32)
Calcium: 7.5 mg/dL — ABNORMAL LOW (ref 8.9–10.3)
Chloride: 109 mmol/L (ref 98–111)
Creatinine, Ser: 1.79 mg/dL — ABNORMAL HIGH (ref 0.61–1.24)
GFR, Estimated: 43 mL/min — ABNORMAL LOW (ref >60)
Glucose, Bld: 256 mg/dL — ABNORMAL HIGH (ref 70–99)
Potassium: 4.2 mmol/L (ref 3.5–5.1)
Sodium: 135 mmol/L (ref 135–145)

## 2023-09-18 LAB — HIV ANTIBODY (ROUTINE TESTING W REFLEX): HIV Screen 4th Generation wRfx: NONREACTIVE

## 2023-09-18 LAB — HEMOGLOBIN A1C
Hgb A1c MFr Bld: 7.2 % — ABNORMAL HIGH (ref 4.8–5.6)
Mean Plasma Glucose: 159.94 mg/dL

## 2023-09-18 LAB — CBG MONITORING, ED
Glucose-Capillary: 159 mg/dL — ABNORMAL HIGH (ref 70–99)
Glucose-Capillary: 201 mg/dL — ABNORMAL HIGH (ref 70–99)
Glucose-Capillary: 235 mg/dL — ABNORMAL HIGH (ref 70–99)
Glucose-Capillary: 247 mg/dL — ABNORMAL HIGH (ref 70–99)

## 2023-09-18 LAB — GLUCOSE, CAPILLARY
Glucose-Capillary: 238 mg/dL — ABNORMAL HIGH (ref 70–99)
Glucose-Capillary: 156 mg/dL — ABNORMAL HIGH (ref 70–99)

## 2023-09-18 LAB — VITAMIN B12: Vitamin B-12: 471 pg/mL (ref 180–914)

## 2023-09-18 LAB — ABO/RH: ABO/RH(D): A NEG

## 2023-09-18 LAB — FOLATE: Folate: 21.8 ng/mL (ref >5.9)

## 2023-09-18 LAB — CORTISOL: Cortisol, Plasma: 26.4 ug/dL

## 2023-09-18 LAB — PREPARE RBC (CROSSMATCH)

## 2023-09-18 LAB — CORTISOL-AM, BLOOD: Cortisol - AM: 23.4 ug/dL — ABNORMAL HIGH (ref 6.7–22.6)

## 2023-09-18 MED ORDER — LACTATED RINGERS IV SOLN
INTRAVENOUS | Status: AC
Start: 2023-09-18 — End: 2023-09-19

## 2023-09-18 MED ORDER — PANTOPRAZOLE SODIUM 40 MG IV SOLR
40.0000 mg | Freq: Two times a day (BID) | INTRAVENOUS | Status: DC
  Administered 2023-09-18 – 2023-09-24 (×12): 40 mg via INTRAVENOUS
  Filled 2023-09-18 (×12): qty 10

## 2023-09-18 MED ORDER — PANTOPRAZOLE SODIUM 40 MG IV SOLR
40.0000 mg | INTRAVENOUS | Status: DC
  Filled 2023-09-18: qty 10

## 2023-09-18 MED ORDER — SODIUM CHLORIDE 0.9% IV SOLUTION: Freq: Once | INTRAVENOUS | Status: AC

## 2023-09-18 MED ORDER — PNEUMOCOCCAL 20-VAL CONJ VACC 0.5 ML IM SUSY
0.5000 mL | PREFILLED_SYRINGE | INTRAMUSCULAR | Status: DC
  Filled 2023-09-18: qty 0.5

## 2023-09-18 NOTE — Consult Note (Signed)
 Consultation Note   Referring Provider:  [facility] PCP: [name], PA-C Primary Gastroenterologist:  [facility]- Dr. Octaviano Glow   Reason for Consultation:  Melena, anemia DOA: 09/17/2023         Hospital Day: 2   ASSESSMENT    63 yo male with GI bleed, presumed upper with hypotension / syncope and melena  Acute blood loss anemia. Hgb 13.4 in December, down to 8.7 this admission with further decline to 5.4 ( after IVF)  History of colon polyps.  4 subcentimeter polyps including a TVA in Aug 2023.   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 Recurrent pancreatitis with pseudocysts in 2019 /  chronic calcific pancreatitis with pancreatic insufficiency.    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 --Will change diet from solids to clear liquids . NPO after MN --Monitor H/H.  --Will need EGD following resuscitation / blood transfusion. Most likely will be done tomorrow.The risks and benefits of EGD with possible biopsies were discussed with the patient who agrees to proceed.  -- If EGD nondiagnostic then will likely proceed with inpatient colonoscopy.   HPI   63 yo male admitted yesterday for evaluation of syncope / hypotenion. Initial concern was for adrenal insufficiency but cortisol levels were reassuring. Subsequently he had a significant drop  in hgb and started having melena.  Baseline hemoglobin on labs in Care Everywhere from December 2024 remarkable for hemoglobin in the 13 range.  He presented to the ED yesterday at 8.7 with further decline to 5.4 after IV fluids.  Patient does not think he has been having any overt GI bleeding at home but today had to melenic bowel movements.  No associated abdominal pain just some lower abdominal bloating. No nausea.  No prior history of GI bleeding.  About once a week patient takes an Excedrin for headaches.  No other NSAIDs.  He takes an acid reducer and or Tums as needed for GERD.   Patient has a history of necrotizing pancreatitis requiring surgical necrosectomy in 2012. He has a history of pancreatic pseudocyst requiring cystogastrostomy in 2019 at [facility].  He has chronic pancreatitis with pancreatic insufficiency for which he takes Creon.  He has not had any alcohol since 2020.    Previous GI Evaluations   Aug 2023 screening colonoscopy  The total BBPS score was 6- adequate. Findings  --6 mm semi-pedunculated polyp in the cecum; completely removed .  --8 mm pedunculated polyp in the transverse colon; completely removed --2 mm sessile polyp in the transverse colon; performed complete removal  --5 mm sessile polyp in the sigmoid colon; completely removed   --Medium Internal  hemorrhoids were seen on rectal retroflection.   A.  TRANSVERSE COLON POLYPS X 2, BIOPSY:   TUBULAR ADENOMA.    FRAGMENT OF COLONIC MUCOSA WITH NO SIGNIFICANT HISTOPATHOLOGIC ABNORMALITIES.   B.  CECAL POLYP, BIOPSY:   TUBULAR ADENOMA.   C.  SIGMOID COLON POLYP, BIOPSY:   TUBULOVILLOUS ADENOMA.    Recent Labs    09/17/23 1715 09/18/23 0617  WBC 10.4 5.7  HGB 8.7* 5.4*  HCT 25.1* 16.2*  PLT 218 153   Recent Labs    09/17/23 1715 09/18/23 0507  NA 133* 135  K 5.8* 4.2  CL 101 109  CO2 21* 20*  GLUCOSE 219* 256*  BUN 67* 65*  CREATININE 1.92* 1.79*  CALCIUM 8.4* 7.5*   Recent Labs     09/17/23 1715  PROT 5.1*  ALBUMIN 3.0*  AST 31  ALT 34  ALKPHOS 45  BILITOT 0.5   No results for input(s): "LABPROT", "INR" in the last 72 hours.    Past Medical History:  Diagnosis Date   'light-for-dates' infant with signs of fetal malnutrition 04/12/2021   Adrenal insufficiency (HCC) 04/12/2021   Diabetes mellitus without complication (HCC)    Ketoacidosis 03/21/2021   Shock (HCC)     Past Surgical History:  Procedure Laterality Date   APPENDECTOMY     BACK SURGERY     CHOLECYSTECTOMY     HERNIA REPAIR      Family History  Problem Relation Age of Onset   Breast cancer Mother    Thyroid disease Mother    Breast cancer Sister    Pancreatic cancer Maternal Grandmother    Ovarian cancer Maternal Grandmother    Hypertension Paternal Grandfather     Prior to Admission medications   Medication Sig Start Date End Date Taking? Authorizing Provider  Cholecalciferol (VITAMIN D3) 75 MCG (3000 UT) TABS Take 3,000 Units by mouth daily.   Yes [provider]  cyclobenzaprine (FLEXERIL) 10 MG tablet Take 10 mg by mouth at bedtime as needed for muscle spasms. 09/10/23  Yes [provider]  DESCOVY 200-25 MG tablet Take 1 tablet by mouth daily. 06/12/22  Yes [provider]  diazepam (VALIUM) 2 MG tablet Take 2 mg by mouth at bedtime as needed (for leg cramps). 08/25/22  Yes [provider]  insulin aspart (NOVOLOG) 100 UNIT/ML injection Inject 0-5 Units into the skin 2 (two) times daily as needed (for BG greater than 180). 09/12/23 09/11/24 Yes [provider]  lipase/protease/amylase (CREON) 36000 UNITS CPEP capsule Take 1 capsule (36,000 Units total) by mouth 3 (three) times daily with meals. Patient taking differently: Take 72,000 Units by mouth 3 (three) times daily with meals. Take 2 capsules by mouth three times a day with meals and 1 capsule with snack 09/16/21  Yes Amponsah, Flossie Buffy, MD  Multiple Vitamin (MULTIVITAMIN) tablet Take 1  tablet by mouth daily.   Yes [provider]  tadalafil (CIALIS) 20 MG tablet Take 20 mg by mouth as needed for erectile dysfunction.   Yes [provider]  traZODone (DESYREL) 50 MG tablet Take 1 tablet by mouth daily.   Yes [provider]  TRESIBA FLEXTOUCH 100 UNIT/ML FlexTouch Pen INJECT 12 AT BEDTIME 09/12/23  Yes [provider]  BAQSIMI ONE PACK 3 MG/DOSE POWD Place 1 spray into the nose once. Patient not taking: Reported on 09/17/2023 06/29/23   [provider]  Blood Glucose Monitoring Suppl (TRUE METRIX METER) w/Device KIT Use up to four times daily as  directed. 04/07/21   Arrien, York Ram, MD  glucose blood (TRUE METRIX BLOOD GLUCOSE TEST) test strip Use as directed to test blood sugar 08/11/21   Steffanie Rainwater, MD  Insulin Pen Needle 32G X 4 MM MISC Use 1 as directed in the morning, at noon, in the evening, and at bedtime (4 times a day) with insulin 05/30/21   Evlyn Kanner, MD  TRUEplus Lancets 28G MISC Use as directed to test blood sugar 06/28/21   Dolan Amen, MD    Current Facility-Administered Medications  Medication Dose Route Frequency Provider Last Rate Last Admin   0.9 %  sodium chloride infusion (Manually program via Guardrails IV Fluids)   Intravenous Once Jonah Blue, MD       acetaminophen (TYLENOL) tablet 650 mg  650 mg Oral Q6H PRN Charlsie Quest, MD       Or   acetaminophen (TYLENOL) suppository 650 mg  650 mg Rectal Q6H PRN Charlsie Quest, MD       HYDROcodone-acetaminophen (NORCO/VICODIN) 5-325 MG per tablet 1-2 tablet  1-2 tablet Oral Q4H PRN Charlsie Quest, MD   1 tablet at 09/18/23 0516   insulin aspart (novoLOG) injection 0-20 Units  0-20 Units Subcutaneous TID WC Charlsie Quest, MD   7 Units at 09/18/23 0725   insulin aspart (novoLOG) injection 0-5 Units  0-5 Units Subcutaneous QHS Charlsie Quest, MD   2 Units at 09/18/23 0019   insulin glargine (LANTUS) injection 12 Units  12 Units  Subcutaneous Q2200 Charlsie Quest, MD   12 Units at 09/18/23 0007   lactated ringers infusion   Intravenous Continuous Jonah Blue, MD       lipase/protease/amylase (CREON) capsule 72,000 Units  72,000 Units Oral TID WC Charlsie Quest, MD   72,000 Units at 09/18/23 0808   ondansetron (ZOFRAN) tablet 4 mg  4 mg Oral Q6H PRN Charlsie Quest, MD       Or   ondansetron (ZOFRAN) injection 4 mg  4 mg Intravenous Q6H PRN Charlsie Quest, MD       pantoprazole (PROTONIX) injection 40 mg  40 mg Intravenous Q24H Jonah Blue, MD       senna-docusate (Senokot-S) tablet 1 tablet  1 tablet Oral QHS PRN Darreld Mclean R, MD       sodium chloride flush (NS) 0.9 % injection 3 mL  3 mL Intravenous Q12H Darreld Mclean R, MD   3 mL at 09/18/23 0913   Current Outpatient Medications  Medication Sig Dispense Refill   Cholecalciferol (VITAMIN D3) 75 MCG (3000 UT) TABS Take 3,000 Units by mouth daily.     cyclobenzaprine (FLEXERIL) 10 MG tablet Take 10 mg by mouth at bedtime as needed for muscle spasms.     DESCOVY 200-25 MG tablet Take 1 tablet by mouth daily.     diazepam (VALIUM) 2 MG tablet Take 2 mg by mouth at bedtime as needed (for leg cramps).     insulin aspart (NOVOLOG) 100 UNIT/ML injection Inject 0-5 Units into the skin 2 (two) times daily as needed (for BG greater than 180).     lipase/protease/amylase (CREON) 36000 UNITS CPEP capsule Take 1 capsule (36,000 Units total) by mouth 3 (three) times daily with meals. (Patient taking differently: Take 72,000 Units by mouth 3 (three) times daily with meals. Take 2 capsules by mouth three times a day with meals and 1 capsule with snack) 100 capsule 5   Multiple Vitamin (MULTIVITAMIN) tablet Take 1 tablet  by mouth daily.     tadalafil (CIALIS) 20 MG tablet Take 20 mg by mouth as needed for erectile dysfunction.     traZODone (DESYREL) 50 MG tablet Take 1 tablet by mouth daily.     TRESIBA FLEXTOUCH 100 UNIT/ML FlexTouch Pen INJECT 12 AT BEDTIME     BAQSIMI  ONE PACK 3 MG/DOSE POWD Place 1 spray into the nose once. (Patient not taking: Reported on 09/17/2023)     Blood Glucose Monitoring Suppl (TRUE METRIX METER) w/Device KIT Use up to four times daily as directed. 1 kit 0   glucose blood (TRUE METRIX BLOOD GLUCOSE TEST) test strip Use as directed to test blood sugar 200 each 2   Insulin Pen Needle 32G X 4 MM MISC Use 1 as directed in the morning, at noon, in the evening, and at bedtime (4 times a day) with insulin 100 each 3   TRUEplus Lancets 28G MISC Use as directed to test blood sugar 100 each 0    Allergies as of 09/17/2023 - Review Complete 09/17/2023  Allergen Reaction Noted   Morphine Other (See Comments) 05/16/2011    Social History   Socioeconomic History   Marital status: Single    Spouse name: Not on file   Number of children: Not on file   Years of education: Not on file   Highest education level: Not on file  Occupational History   Not on file  Tobacco Use   Smoking status: Never   Smokeless tobacco: Never  Substance and Sexual Activity   Alcohol use: Not on file   Drug use: Not on file   Sexual activity: Not on file  Other Topics Concern   Not on file  Social History Narrative   Not on file   Social Drivers of Health   Financial Resource Strain: Not on file  Food Insecurity: Low Risk  (09/12/2023)   Received from [facility]   Hunger Vital Sign    Worried About Running Out of Food in the Last Year: Never true    Ran Out of Food in the Last Year: Never true  Transportation Needs: No Transportation Needs (09/12/2023)   Received from [facility]    In the past 12 months, has lack of reliable transportation kept you from medical appointments, meetings, work or from getting things needed for daily living? : No  Physical Activity: Not on file  Stress: Not on file  Social Connections: Not on file  Intimate Partner Violence: Not At Risk (07/14/2021)   Humiliation, Afraid, Rape, and Kick  questionnaire    Fear of Current or Ex-Partner: No    Emotionally Abused: No    Physically Abused: No    Sexually Abused: No     Code Status   Code Status: Do not attempt resuscitation (DNR) PRE-ARREST INTERVENTIONS DESIRED  Review of Systems: All systems reviewed and negative except where noted in HPI.  Physical Exam: Vital signs in last 24 hours: Temp:  [97.7 F (36.5 C)-99.4 F (37.4 C)] 98.5 F (36.9 C) (03/18 0719) Pulse Rate:  [81-102] 87 (03/18 0930) Resp:  [12-23] 17 (03/18 0930) BP: (85-123)/(54-81) 117/62 (03/18 0930) SpO2:  [99 %-100 %] 100 % (03/18 0930) Last BM Date : 09/18/23  General:  Pleasant male in NAD Psych:  Cooperative. Normal mood and affect Eyes: Pupils equal Ears:  Normal auditory acuity Nose: No deformity, discharge or lesions Neck:  Supple, no masses felt Lungs:  Clear to auscultation.  Heart:  Regular rate, regular rhythm.  Abdomen:  Soft, nondistended, nontender, active bowel sounds, no masses felt Rectal :  Deferred Msk: Symmetrical without gross deformities.  Neurologic:  Alert, oriented, grossly normal neurologically Extremities : No edema Skin:  Intact without significant lesions.    Intake/Output from previous day: 03/17 0701 - 03/18 0700 In: 2504.8 [I.V.:600; IV Piggyback:1904.8] Out: 400 [Urine:400] Intake/Output this shift:  Total I/O In: 1315.4 [I.V.:1315.4] Out: -    Willette Cluster, NP-C   09/18/2023, 9:53 AM

## 2023-09-18 NOTE — Progress Notes (Signed)
 Progress Note   Patient: Allen Russell KGM:[id number] DOB: 06/13/1961 DOA: 09/17/2023     1 DOS: the patient was seen and examined on 09/18/2023   Brief hospital course: TRIGG DELAROCHA is a 63 y.o. 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  Assessment and Plan:  ABLA Patient has had recurrent syncope and was hypotensive in the ER Initial concern was for adrenal insufficiency but cortisol levels are reassuring In the meantime, he has had significant Hgb drop concerning for ABLA as the cause Patient has history of pancreatic insufficiency Last colonoscopy was in 01/2022 with 4 subcentimeter polyps removed; tubular adenoma x 2, tubovillous adenoma x 1 He reports some early satiety recently but currently with mild BLQ abdominal pain Some darker stools yesterday without frank blood or tar Admitted to progressive bed  GI consulted this AM LR at 100 mL/hr Start IV pantoprazole 40 mg daily for now Zofran IV for nausea Avoid NSAIDs and SQ heparin; will place SCDs Maintain IV access (2 large bore IVs if possible). His Hgb decreased from 8.7 on presentation to 5.4 this AM (confirmed), 13.4 in 06/2023 Type and screen were done in ED Two units of blood are ordered  Monitor closely and follow cbc, transfuse as necessary for Hbg <7   Hypotension, syncope Patient with prior history of adrenal insufficiency in late 2022 with low basal cortisol, off steroids for years Now presenting with syncopal episodes due to hypotension Concern for adrenal insufficiency with associated hyperkalemia and mild hyponatremia S/p IV Solu-Cortef 100 mg  once, will stop at this time in the setting of unremarkable cortisol tests Continue IV fluid hydration    Acute kidney injury superimposed on CKD stage IIIa Hyperkalemia and hyponatremia are resolved this AM (given Lokelma and IVF) and renal function is improving Suspect due to hypotensive episodes in the setting of hypovolemia associated with ABLA Continue IV fluid hydration  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 A1c is 7.2, reasonable control Continue Lantus 12 units nightly Add resistance scale SSI given steroid use Current hyperglycemia is likely related to steroids and should improve now that steroids are stopped   Chronic pancreatitis with prior history of necrotizing pancreatitis Continue Creon    Consultants: GI  Procedures: Blood transfusion x 2 units 3/18  Antibiotics: Cefepime x 1 Vancomycin x 1  30 Day Unplanned Readmission Risk Score    Flowsheet Row ED to Hosp-Admission (Current) from 09/17/2023 in [facility] Emergency Department at [facility]  30 Day Unplanned Readmission Risk Score (%) 17.59 Filed at 09/18/2023 0801       This score is the patient's risk of an unplanned readmission within 30 days of being discharged (0 -100%). The score is based on dignosis, age, lab data, medications, orders, and past utilization.   Low:  0-14.9   Medium: 15-21.9   High: 22-29.9   Extreme: 30 and above           Subjective: Feels very weak and tired.  Some mild lower abdominal pain.  Has had recent early satiety.  Physical Exam: Vitals:   09/18/23 0719 09/18/23 0800 09/18/23 0900 09/18/23 0930  BP:  (!) 106/58 (!) 108/59 117/62  Pulse:  86 84 87  Resp:  19 15  17  Temp: 98.5 F (36.9 C)     TempSrc: Oral     SpO2:  100% 100% 100%     Intake/Output Summary (Last 24 hours) at 09/18/2023 0943 Last data filed at 09/18/2023 9629 Gross per 24 hour  Intake 3820.2 ml  Output 400 ml  Net 3420.2 ml   There were no vitals filed for  this visit.  Exam:  General:  Appears calm and comfortable and is in NAD, pale Eyes:  EOMI, normal lids, iris, marked conjunctival pallor ENT:  grossly normal hearing, lips & tongue, mmm Neck:  no LAD, masses or thyromegaly Cardiovascular:  RRR, no m/r/g. No LE edema.  Respiratory:   CTA bilaterally with no wheezes/rales/rhonchi.  Normal respiratory effort. Abdomen:  soft, diffusely TTP but more in B LQ, ND Skin:  no rash or induration seen on limited exam Musculoskeletal:  grossly normal tone BUE/BLE, good ROM, no bony abnormality Psychiatric:  blunted mood and affect, speech fluent and appropriate, AOx3 Neurologic:  CN 2-12 grossly intact, moves all extremities in coordinated fashion  Data Reviewed: I have reviewed the patient's lab results since admission.  Pertinent labs for today include:  CO 20 Glucose 256 BUN 65/Creatinine 1.79/GFR 43, 67/1.92/39 on 3/17 Lactate 2.4, 1.5 Hgb 5.4, down from 8.7 Cortisol random 26.4, fasting 23.4 A1c 7.2 UA: 50 ketones    Family Communication: None present; I left a message for his sister by telephone  Disposition: Status is: Inpatient Remains inpatient appropriate because: ongoing management  Planned Discharge Destination: Home    Time spent: 50 minutes  Author: Jonah Blue, MD 09/18/2023 9:43 AM  For on call review [email or web address].

## 2023-09-19 ENCOUNTER — Encounter (HOSPITAL_COMMUNITY)
Admission: EM | Disposition: A | Discharge status: Home / Self Care | Payer: Self-pay | Source: Home / Self Care | Attending: Internal Medicine

## 2023-09-19 ENCOUNTER — Inpatient Hospital Stay (HOSPITAL_COMMUNITY): Admitting: Anesthesiology

## 2023-09-19 ENCOUNTER — Encounter (HOSPITAL_COMMUNITY): Payer: Self-pay | Admitting: Internal Medicine

## 2023-09-19 DIAGNOSIS — K922 Gastrointestinal hemorrhage, unspecified: Secondary | ICD-10-CM

## 2023-09-19 DIAGNOSIS — K208 Other esophagitis without bleeding: Secondary | ICD-10-CM

## 2023-09-19 DIAGNOSIS — K921 Melena: Secondary | ICD-10-CM | POA: Diagnosis not present

## 2023-09-19 DIAGNOSIS — D62 Acute posthemorrhagic anemia: Secondary | ICD-10-CM

## 2023-09-19 DIAGNOSIS — E119 Type 2 diabetes mellitus without complications: Secondary | ICD-10-CM | POA: Diagnosis not present

## 2023-09-19 DIAGNOSIS — K2081 Other esophagitis with bleeding: Secondary | ICD-10-CM

## 2023-09-19 HISTORY — PX: ESOPHAGOGASTRODUODENOSCOPY: SHX5428

## 2023-09-19 LAB — HEMOGLOBIN AND HEMATOCRIT, BLOOD
HCT: 16.8 % — ABNORMAL LOW (ref 39.0–52.0)
Hemoglobin: 5.8 g/dL — CL (ref 13.0–17.0)

## 2023-09-19 LAB — BASIC METABOLIC PANEL
Anion gap: 6 (ref 5–15)
BUN: 56 mg/dL — ABNORMAL HIGH (ref 6–20)
CO2: 22 mmol/L (ref 22–32)
Calcium: 7.3 mg/dL — ABNORMAL LOW (ref 8.9–10.3)
Chloride: 109 mmol/L (ref 98–111)
Creatinine, Ser: 1.74 mg/dL — ABNORMAL HIGH (ref 0.61–1.24)
GFR, Estimated: 44 mL/min — ABNORMAL LOW (ref >60)
Glucose, Bld: 186 mg/dL — ABNORMAL HIGH (ref 70–99)
Potassium: 3.8 mmol/L (ref 3.5–5.1)
Sodium: 137 mmol/L (ref 135–145)

## 2023-09-19 LAB — MRSA NEXT GEN BY PCR, NASAL: MRSA by PCR Next Gen: NOT DETECTED

## 2023-09-19 LAB — PREPARE RBC (CROSSMATCH)

## 2023-09-19 LAB — GLUCOSE, CAPILLARY
Glucose-Capillary: 147 mg/dL — ABNORMAL HIGH (ref 70–99)
Glucose-Capillary: 157 mg/dL — ABNORMAL HIGH (ref 70–99)
Glucose-Capillary: 173 mg/dL — ABNORMAL HIGH (ref 70–99)
Glucose-Capillary: 184 mg/dL — ABNORMAL HIGH (ref 70–99)

## 2023-09-19 LAB — MAGNESIUM: Magnesium: 1.6 mg/dL — ABNORMAL LOW (ref 1.7–2.4)

## 2023-09-19 SURGERY — EGD (ESOPHAGOGASTRODUODENOSCOPY)
Anesthesia: Monitor Anesthesia Care

## 2023-09-19 MED ORDER — SODIUM CHLORIDE 0.9% IV SOLUTION: Freq: Once | INTRAVENOUS | Status: AC

## 2023-09-19 MED ORDER — IPRATROPIUM-ALBUTEROL 0.5-2.5 (3) MG/3ML IN SOLN
3.0000 mL | RESPIRATORY_TRACT | Status: DC | PRN
  Filled 2023-09-19: qty 3

## 2023-09-19 MED ORDER — PROPOFOL 500 MG/50ML IV EMUL
INTRAVENOUS | Status: DC | PRN
  Administered 2023-09-19: 150 ug/kg/min via INTRAVENOUS

## 2023-09-19 MED ORDER — METOCLOPRAMIDE HCL 5 MG/ML IJ SOLN: 5.0000 mg | Freq: Once | INTRAMUSCULAR | Status: AC

## 2023-09-19 MED ORDER — METOCLOPRAMIDE HCL 5 MG/ML IJ SOLN
INTRAMUSCULAR | Status: AC
  Administered 2023-09-19: 5 mg via INTRAVENOUS
  Filled 2023-09-19: qty 2

## 2023-09-19 MED ORDER — ALBUMIN HUMAN 5 % IV SOLN: INTRAVENOUS | Status: DC | PRN

## 2023-09-19 MED ORDER — TRAZODONE HCL 50 MG PO TABS
50.0000 mg | ORAL_TABLET | Freq: Every evening | ORAL | Status: DC | PRN
  Administered 2023-09-19 – 2023-09-23 (×5): 50 mg via ORAL
  Filled 2023-09-19 (×5): qty 1

## 2023-09-19 MED ORDER — PHENYLEPHRINE HCL (PRESSORS) 10 MG/ML IV SOLN
INTRAVENOUS | Status: DC | PRN
  Administered 2023-09-19: 320 ug via INTRAVENOUS
  Administered 2023-09-19: 160 ug via INTRAVENOUS

## 2023-09-19 MED ORDER — PROPOFOL 10 MG/ML IV BOLUS
INTRAVENOUS | Status: DC | PRN
  Administered 2023-09-19: 100 mg via INTRAVENOUS

## 2023-09-19 MED ORDER — BUTAMBEN-TETRACAINE-BENZOCAINE 2-2-14 % EX AERO
INHALATION_SPRAY | CUTANEOUS | Status: DC | PRN
Start: 2023-09-19 — End: 2023-09-19
  Administered 2023-09-19: 1 via TOPICAL

## 2023-09-19 MED ORDER — METOPROLOL TARTRATE 5 MG/5ML IV SOLN: 5.0000 mg | INTRAVENOUS | Status: DC | PRN

## 2023-09-19 MED ORDER — BUTAMBEN-TETRACAINE-BENZOCAINE 2-2-14 % EX AERO
INHALATION_SPRAY | CUTANEOUS | Status: AC
  Filled 2023-09-19: qty 20

## 2023-09-19 MED ORDER — GUAIFENESIN 100 MG/5ML PO LIQD: 5.0000 mL | ORAL | Status: DC | PRN

## 2023-09-19 MED ORDER — HYDRALAZINE HCL 20 MG/ML IJ SOLN: 10.0000 mg | INTRAMUSCULAR | Status: DC | PRN

## 2023-09-19 MED ORDER — PHENOL 1.4 % MT LIQD
1.0000 | OROMUCOSAL | Status: DC | PRN
Start: 2023-09-19 — End: 2023-09-24
  Administered 2023-09-19: 1 via OROMUCOSAL
  Filled 2023-09-19: qty 177

## 2023-09-19 MED ORDER — EPHEDRINE SULFATE (PRESSORS) 50 MG/ML IJ SOLN
INTRAMUSCULAR | Status: DC | PRN
  Administered 2023-09-19: 15 mg via INTRAVENOUS
  Administered 2023-09-19: 10 mg via INTRAVENOUS

## 2023-09-19 NOTE — H&P (Signed)
 [facility] History and Physical   Primary Care Physician:  [name], PA-C   Reason for Procedure:  Anemia, melena, hypotension  Plan:    EGD   HPI: Allen Russell is a 63 y.o. male undergoing EGD for evaluation of anemia, melena and hypotension.  Patient presented to the hospital on 09/19/2023 with symptoms of weakness and fatigue.  Hemoglobin decreased from 8.7-5.4.  He was given 2 units of packed red blood cells with an appropriate rise in hemoglobin to 6.5.  Currently receiving another unit of blood.  Denies further melena overnight.  Blood pressures this morning were soft prior to receiving blood with systolics in the high 90s.  Blood pressure now improved to 108/62.  Patient reports some throat discomfort and abdominal tightness.   Past Medical History:  Diagnosis Date   'Light-for-dates' infant with signs of fetal malnutrition 04/12/2021   Adrenal insufficiency (HCC) 04/12/2021   Chronic kidney disease    Per pt, fluctuates between Stage IIIB and IV from April 2023.   Diabetes mellitus without complication (HCC)    Ketoacidosis 03/21/2021   Shock (HCC)     Past Surgical History:  Procedure Laterality Date   APPENDECTOMY     BACK SURGERY     CHOLECYSTECTOMY     HERNIA REPAIR      Prior to Admission medications   Medication Sig Start Date End Date Taking? Authorizing Provider  Cholecalciferol (VITAMIN D3) 75 MCG (3000 UT) TABS Take 3,000 Units by mouth daily.   Yes [provider]  cyclobenzaprine (FLEXERIL) 10 MG tablet Take 10 mg by mouth at bedtime as needed for muscle spasms. 09/10/23  Yes [provider]  DESCOVY 200-25 MG tablet Take 1 tablet by mouth daily. 06/12/22  Yes [provider]  diazepam (VALIUM) 2 MG tablet Take 2 mg by mouth at bedtime as needed (for leg cramps). 08/25/22  Yes [provider]  insulin aspart (NOVOLOG) 100 UNIT/ML injection Inject 0-5 Units into the skin 2 (two) times daily as needed (for  BG greater than 180). 09/12/23 09/11/24 Yes [provider]  lipase/protease/amylase (CREON) 36000 UNITS CPEP capsule Take 1 capsule (36,000 Units total) by mouth 3 (three) times daily with meals. Patient taking differently: Take 72,000 Units by mouth 3 (three) times daily with meals. Take 2 capsules by mouth three times a day with meals and 1 capsule with snack 09/16/21  Yes Amponsah, Flossie Buffy, MD  Multiple Vitamin (MULTIVITAMIN) tablet Take 1 tablet by mouth daily.   Yes [provider]  tadalafil (CIALIS) 20 MG tablet Take 20 mg by mouth as needed for erectile dysfunction.   Yes [provider]  traZODone (DESYREL) 50 MG tablet Take 1 tablet by mouth daily.   Yes [provider]  TRESIBA FLEXTOUCH 100 UNIT/ML FlexTouch Pen INJECT 12 AT BEDTIME 09/12/23  Yes [provider]  BAQSIMI ONE PACK 3 MG/DOSE POWD Place 1 spray into the nose once. Patient not taking: Reported on 09/17/2023 06/29/23   [provider]  Blood Glucose Monitoring Suppl (TRUE METRIX METER) w/Device KIT Use up to four times daily as directed. 04/07/21   Arrien, York Ram, MD  glucose blood (TRUE METRIX BLOOD GLUCOSE TEST) test strip Use as directed to test blood sugar 08/11/21   Steffanie Rainwater, MD  Insulin Pen Needle 32G X 4 MM MISC Use 1 as directed in the morning, at noon, in the evening, and at bedtime (4 times a day) with insulin 05/30/21   Braswell,  Aneta Mins, MD  TRUEplus Lancets 28G MISC Use as directed to test blood sugar 06/28/21   Dolan Amen, MD    Current Facility-Administered Medications  Medication Dose Route Frequency Provider Last Rate Last Admin   [facility] Hold] 0.9 %  sodium chloride infusion (Manually program via Guardrails IV Fluids)   Intravenous Once Amin, Doreen Salvage, MD       [MAR Hold] acetaminophen (TYLENOL) tablet 650 mg  650 mg Oral Q6H PRN Charlsie Quest, MD       Or   Mitzi Hansen Hold] acetaminophen (TYLENOL) suppository 650 mg  650 mg Rectal Q6H PRN  Charlsie Quest, MD       [MAR Hold] HYDROcodone-acetaminophen (NORCO/VICODIN) 5-325 MG per tablet 1-2 tablet  1-2 tablet Oral Q4H PRN Charlsie Quest, MD   1 tablet at 09/18/23 2125   [facility] Hold] insulin aspart (novoLOG) injection 0-20 Units  0-20 Units Subcutaneous TID WC Charlsie Quest, MD   7 Units at 09/18/23 1756   [MAR Hold] insulin aspart (novoLOG) injection 0-5 Units  0-5 Units Subcutaneous QHS Charlsie Quest, MD   2 Units at 09/18/23 0019   [MAR Hold] insulin glargine (LANTUS) injection 12 Units  12 Units Subcutaneous Q2200 Charlsie Quest, MD   12 Units at 09/18/23 2211   lactated ringers infusion   Intravenous Continuous Jonah Blue, MD   Stopped at 09/19/23 0835   [MAR Hold] lipase/protease/amylase (CREON) capsule 72,000 Units  72,000 Units Oral TID WC Charlsie Quest, MD   72,000 Units at 09/18/23 1756   [MAR Hold] ondansetron (ZOFRAN) tablet 4 mg  4 mg Oral Q6H PRN Charlsie Quest, MD       Or   Mitzi Hansen Hold] ondansetron (ZOFRAN) injection 4 mg  4 mg Intravenous Q6H PRN Charlsie Quest, MD       [MAR Hold] pantoprazole (PROTONIX) injection 40 mg  40 mg Intravenous Q12H Meredith Pel, NP   40 mg at 09/18/23 2124   [MAR Hold] phenol (CHLORASEPTIC) mouth spray 1 spray  1 spray Mouth/Throat PRN Jonah Blue, MD   1 spray at 09/19/23 0528   [MAR Hold] pneumococcal 20-valent conjugate vaccine (PREVNAR 20) injection 0.5 mL  0.5 mL Intramuscular Tomorrow-1000 Jonah Blue, MD       [facility] Hold] senna-docusate (Senokot-S) tablet 1 tablet  1 tablet Oral QHS PRN Charlsie Quest, MD       [facility] Hold] sodium chloride flush (NS) 0.9 % injection 3 mL  3 mL Intravenous Q12H Darreld Mclean R, MD   3 mL at 09/18/23 2124    Allergies as of 09/17/2023 - Review Complete 09/17/2023  Allergen Reaction Noted   Morphine Other (See Comments) 05/16/2011    Family History  Problem Relation Age of Onset   Breast cancer Mother    Thyroid disease Mother    Breast cancer Sister    Pancreatic cancer  Maternal Grandmother    Ovarian cancer Maternal Grandmother    Hypertension Paternal Grandfather     Social History   Socioeconomic History   Marital status: Single    Spouse name: Not on file   Number of children: Not on file   Years of education: Not on file   Highest education level: Bachelor's degree (e.g., BA, AB, BS)  Occupational History   Not on file  Tobacco Use   Smoking status: Never   Smokeless tobacco: Never  Vaping Use   Vaping status: Never Used  Substance and Sexual Activity  Alcohol use: Not Currently    Comment: last drink in 2020   Drug use: Not Currently    Comment: 1998 ectasy   Sexual activity: Yes    Partners: Male    Birth control/protection: None  Other Topics Concern   Not on file  Social History Narrative   Not on file   Social Drivers of Health   Financial Resource Strain: Not on file  Food Insecurity: Food Insecurity Present (09/18/2023)   Hunger Vital Sign    Worried About Running Out of Food in the Last Year: Sometimes true    Ran Out of Food in the Last Year: Sometimes true  Transportation Needs: No Transportation Needs (09/18/2023)   PRAPARE - Administrator, Civil Service (Medical): No    Lack of Transportation (Non-Medical): No  Physical Activity: Not on file  Stress: Not on file  Social Connections: Not on file  Intimate Partner Violence: Not At Risk (09/18/2023)   Humiliation, Afraid, Rape, and Kick questionnaire    Fear of Current or Ex-Partner: No    Emotionally Abused: No    Physically Abused: No    Sexually Abused: No    Review of Systems:  All other review of systems negative except as mentioned in the HPI.  Physical Exam: Vital signs BP 108/62   Pulse 83   Temp 97.9 F (36.6 C) (Temporal)   Resp 12   Wt 96.6 kg   SpO2 98%   BMI 25.92 kg/m   General:   Alert,  Well-developed, well-nourished, pleasant and cooperative in NAD Lungs:  Clear throughout to auscultation.   Heart:  Regular rate and  rhythm; no murmurs, clicks, rubs,  or gallops. Abdomen:  Soft, nontender and nondistended. Normal bowel sounds.   Neuro/Psych:  Normal mood and affect. A and O x 3  Maren Beach, MD [facility]

## 2023-09-19 NOTE — TOC Initial Note (Addendum)
 Transition of Care [facility]) - Initial/Assessment Note    Patient Details  Name: Allen Russell MRN: [id number] Date of Birth: 04/14/1961  Transition of [facility]) CM/SW Contact:    Lamonte Sakai, Student-Social Work Phone Number: 09/19/2023, 2:56 PM  Clinical Narrative:                  Pt admitted from home due to HTN. No current TOC needs please consult as needs arise.       Patient Goals and CMS Choice            Expected Discharge Plan and Services       Living arrangements for the past 2 months: Single Family Home                                      Prior Living Arrangements/Services Living arrangements for the past 2 months: Single Family Home                     Activities of Daily Living   ADL Screening (condition at time of admission) Independently performs ADLs?: Yes (appropriate for developmental age) Is the patient deaf or have difficulty hearing?: No Does the patient have difficulty seeing, even when wearing glasses/contacts?: No Does the patient have difficulty concentrating, remembering, or making decisions?: No  Permission Sought/Granted                  Emotional Assessment       Orientation: : Oriented to Self, Oriented to Place, Oriented to  Time, Oriented to Situation      Admission diagnosis:  Adrenal crisis (HCC) [E27.2] Hypotensive episode [I95.9] Patient Active Problem List   Diagnosis Date Noted   ABLA (acute blood loss anemia) 09/18/2023   Syncope and collapse 09/18/2023   Melena 09/18/2023   Adrenal crisis (HCC) 09/17/2023   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) 09/17/2023   Hypotension 09/17/2023   Hyperkalemia 09/17/2023   Hyponatremia 09/17/2023   Chronic pancreatitis (HCC) 09/17/2023   Acute kidney injury superimposed on chronic kidney disease (HCC) 10/15/2021   Leg cramps 10/11/2021   Pancreatic insufficiency 06/28/2021   Type 2 diabetes mellitus with ketoacidosis (HCC)  04/12/2021   Dysautonomia (HCC)    Postlaminectomy syndrome, lumbar region 09/11/2017   Spondylosis without myelopathy or radiculopathy, lumbar region 09/11/2017   PCP:  [name], PA-C Pharmacy:   [id number] - [place] ([place]), [place] - [address] [place] ([place]) [place] [address] Phone: [phone] Fax: [phone]     Social Drivers of Health (SDOH) Social History: SDOH Screenings   Food Insecurity: Food Insecurity Present (09/18/2023)  Housing: Low Risk  (09/18/2023)  Transportation Needs: No Transportation Needs (09/18/2023)  Utilities: Not At Risk (09/18/2023)  Depression (PHQ2-9): Low Risk  (10/19/2021)  Tobacco Use: Low Risk  (09/19/2023)   SDOH Interventions:     Readmission Risk Interventions     No data to display

## 2023-09-19 NOTE — Anesthesia Preprocedure Evaluation (Addendum)
 Anesthesia Evaluation  Patient identified by MRN, date of birth, ID band Patient awake    Reviewed: Allergy & Precautions, NPO status , Patient's Chart, lab work & pertinent test results  Airway Mallampati: II  TM Distance: >3 FB Neck ROM: Full    Dental  (+) Dental Advisory Given, Missing, Partial Upper   Pulmonary neg pulmonary ROS   Pulmonary exam normal breath sounds clear to auscultation       Cardiovascular Normal cardiovascular exam Rhythm:Regular Rate:Normal  Echo 03/2021  1. Left ventricular ejection fraction, by estimation, is 50 to 55%. The left ventricle has low normal function. The left ventricle has no regional wall motion abnormalities. Left ventricular diastolic parameters were normal.   2. Right ventricular systolic function is mildly reduced. The right ventricular size is moderately enlarged.   3. The mitral valve is normal in structure. Trivial mitral valve regurgitation. No evidence of mitral stenosis.   4. The aortic valve is grossly normal. There is mild calcification of the aortic valve. Aortic valve regurgitation is not visualized. No aortic stenosis is present.   5. Aortic dilatation noted. There is mild dilatation of the aortic root, measuring 42 mm.      Neuro/Psych negative neurological ROS     GI/Hepatic negative GI ROS, Neg liver ROS,,,  Endo/Other  diabetes    Renal/GU Renal InsufficiencyRenal disease     Musculoskeletal  (+) Arthritis ,    Abdominal  (+) + obese  Peds  Hematology  (+) Blood dyscrasia, anemia   Anesthesia Other Findings   Reproductive/Obstetrics                             Anesthesia Physical Anesthesia Plan  ASA: 4  Anesthesia Plan: MAC   Post-op Pain Management: Minimal or no pain anticipated   Induction: Intravenous  PONV Risk Score and Plan: 1 and Propofol infusion, TIVA and Treatment may vary due to age or medical  condition  Airway Management Planned: Natural Airway  Additional Equipment:   Intra-op Plan:   Post-operative Plan:   Informed Consent: I have reviewed the patients History and Physical, chart, labs and discussed the procedure including the risks, benefits and alternatives for the proposed anesthesia with the patient or authorized representative who has indicated his/her understanding and acceptance.   Patient has DNR.  Discussed DNR with patient and Suspend DNR.   Dental advisory given  Plan Discussed with: CRNA  Anesthesia Plan Comments:         Anesthesia Quick Evaluation

## 2023-09-19 NOTE — Progress Notes (Signed)
 PROGRESS NOTE    Allen Russell  WUJ:[id number] DOB: Jul 15, 1960 DOA: 09/17/2023 PCP: [name], PA-C    Brief Narrative:    63 y.o. 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  Hemoglobin 5.4.  Aggressive transfusion if necessary.  Endoscopy showing erosive esophagitis with no active bleeding at this time.  IV PPI twice daily, serial H&H.   CKD stage IIIa In the past 2 years creatinine has been around 2.2.  Patient does not have AKI  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 A1c is 7.2, reasonable control Continue long-acting and sliding scale.  Adjust as necessary   Chronic pancreatitis with prior history of necrotizing pancreatitis Continue Creon     DVT prophylaxis: SCDs    Code Status: Do not attempt resuscitation (DNR) PRE-ARREST INTERVENTIONS DESIRED Family Communication:   Status is: Inpatient Remains inpatient appropriate because: Ongoing GI evaluation    Subjective: Seen after endoscopy.  No complaints.  Overall feels weak with dry mouth   Examination:  General exam: Appears calm and comfortable, appears pale Respiratory system: Clear to auscultation. Respiratory effort  normal. Cardiovascular system: S1 & S2 heard, RRR. No JVD, murmurs, rubs, gallops or clicks. No pedal edema. Gastrointestinal system: Abdomen is nondistended, soft and nontender. No organomegaly or masses felt. Normal bowel sounds heard. Central nervous system: Alert and oriented. No focal neurological deficits. Extremities: Symmetric 5 x 5 power. Skin: No rashes, lesions or ulcers Psychiatry: Judgement and insight appear normal. Mood & affect appropriate.                Diet Orders (From admission, onward)     Start     Ordered   09/19/23 1046  Diet clear liquid Room service appropriate? Yes; Fluid consistency: Thin  Diet effective now       Question Answer Comment  Room service appropriate? Yes   Fluid consistency: Thin      09/19/23 1045            Objective: Vitals:   09/19/23 1030 09/19/23 1040 09/19/23 1151 09/19/23 1337  BP: (!) 106/51 (!) 107/56 105/62 (!) 108/49  Pulse: 76 78  87  Resp: 17 15 13 20   Temp:   97.7 F (36.5 C) 98 F (36.7 C)  TempSrc:   Oral Oral  SpO2: 100% 100% 98% 100%  Weight:        Intake/Output Summary (Last 24 hours) at 09/19/2023 1359 Last data filed at 09/19/2023 1003 Gross per 24 hour  Intake 2857.5 ml  Output 2250 ml  Net 607.5 ml   Filed Weights   09/19/23 0500  Weight: 96.6 kg    Scheduled Meds:  sodium chloride   Intravenous Once   sodium chloride   Intravenous Once   insulin aspart  0-20 Units Subcutaneous TID WC   insulin aspart  0-5 Units Subcutaneous QHS   insulin glargine  12 Units Subcutaneous Q2200   lipase/protease/amylase  72,000 Units Oral TID WC   pantoprazole (PROTONIX) IV  40 mg Intravenous Q12H   pneumococcal 20-valent conjugate vaccine  0.5 mL Intramuscular Tomorrow-1000   sodium chloride flush  3 mL Intravenous Q12H   Continuous Infusions:  Nutritional status     Body mass index is 25.92 kg/m.  Data Reviewed:   CBC: Recent Labs  Lab 09/17/23 1715 09/18/23 0617 09/18/23 1943  09/19/23 1142  WBC 10.4 5.7 8.6  --   NEUTROABS 8.6*  --  4.9  --   HGB 8.7* 5.4* 6.5* 5.8*  HCT 25.1* 16.2* 18.5* 16.8*  MCV 89.3 90.5 86.9  --   PLT 218 153 174  --    Basic Metabolic Panel: Recent Labs  Lab 09/17/23 1715 09/18/23 0507  NA 133* 135  K 5.8* 4.2  CL 101 109  CO2 21* 20*  GLUCOSE 219* 256*  BUN 67* 65*  CREATININE 1.92* 1.79*  CALCIUM 8.4* 7.5*   GFR: CrCl cannot be calculated (Unknown ideal weight.). Liver Function Tests: Recent Labs  Lab 09/17/23 1715  AST 31  ALT 34  ALKPHOS 45  BILITOT 0.5  PROT 5.1*  ALBUMIN 3.0*   Recent Labs  Lab 09/17/23 1715  LIPASE 19   No results for input(s): "AMMONIA" in the last 168 hours. Coagulation Profile: No results for input(s): "INR", "PROTIME" in the last 168 hours. Cardiac Enzymes: No results for input(s): "CKTOTAL", "CKMB", "CKMBINDEX", "TROPONINI" in the last 168 hours. BNP (last 3 results) No results for input(s): "PROBNP" in the last 8760 hours. HbA1C: Recent Labs    09/18/23 0507  HGBA1C 7.2*   CBG: Recent Labs  Lab 09/18/23 1151 09/18/23 1714 09/18/23 2110 09/19/23 0843 09/19/23 1144  GLUCAP 159* 238* 156* 147* 157*   Lipid Profile: No results for input(s): "CHOL", "HDL", "LDLCALC", "TRIG", "CHOLHDL", "LDLDIRECT" in the last 72 hours. Thyroid Function Tests: Recent Labs    09/17/23 1715  TSH 1.809   Anemia Panel: Recent Labs    09/18/23 0507  VITAMINB12 471  FOLATE 21.8  FERRITIN 14*  TIBC 246*  IRON 48   Sepsis Labs: Recent Labs  Lab 09/17/23 1722 09/17/23 2051  LATICACIDVEN 2.4* 1.5    Recent Results (from the past 240 hours)  Resp panel by RT-PCR (RSV, Flu A&B, Covid)     Status: None   Collection Time: 09/17/23  4:52 PM   Specimen: Nasal Swab  Result Value Ref Range Status   SARS Coronavirus 2 by RT PCR NEGATIVE NEGATIVE Final   Influenza A by PCR NEGATIVE NEGATIVE Final   Influenza B by PCR NEGATIVE NEGATIVE Final    Comment: (NOTE) The Xpert Xpress  SARS-CoV-2/FLU/RSV plus assay is intended as an aid in the diagnosis of influenza from Nasopharyngeal swab specimens and should not be used as a sole basis for treatment. Nasal washings and aspirates are unacceptable for Xpert Xpress SARS-CoV-2/FLU/RSV testing.  Fact Sheet for Patients: BloggerCourse.com  Fact Sheet for Healthcare Providers: SeriousBroker.it  This test is not yet approved or cleared by the [place] FDA and has been authorized for detection and/or diagnosis of SARS-CoV-2 by FDA under an Emergency Use Authorization (EUA). This EUA will remain in effect (meaning this test can be used) for the duration of the COVID-19 declaration under Section 564(b)(1) of the Act, 21 U.S.C. section 360bbb-3(b)(1), unless the authorization is terminated or revoked.     Resp Syncytial Virus  by PCR NEGATIVE NEGATIVE Final    Comment: (NOTE) Fact Sheet for Patients: BloggerCourse.com  Fact Sheet for Healthcare Providers: SeriousBroker.it  This test is not yet approved or cleared by the [place] FDA and has been authorized for detection and/or diagnosis of SARS-CoV-2 by FDA under an Emergency Use Authorization (EUA). This EUA will remain in effect (meaning this test can be used) for the duration of the COVID-19 declaration under Section 564(b)(1) of the Act, 21 U.S.C. section 360bbb-3(b)(1), unless the authorization is terminated or revoked.  Performed at [facility], [address] [place], [place] [address]   Blood culture (routine x 2)     Status: None (Preliminary result)   Collection Time: 09/17/23  5:00 PM   Specimen: BLOOD  Result Value Ref Range Status   Specimen Description BLOOD SITE NOT SPECIFIED  Final   Special Requests   Final    BOTTLES DRAWN AEROBIC AND ANAEROBIC Blood Culture results may not be optimal due to an inadequate volume of blood received  in culture bottles   Culture   Final    NO GROWTH 2 DAYS Performed at [facility], [address], [place], [place] [address]    Report Status PENDING  Incomplete  Blood culture (routine x 2)     Status: None (Preliminary result)   Collection Time: 09/17/23  6:45 PM   Specimen: BLOOD  Result Value Ref Range Status   Specimen Description BLOOD SITE NOT SPECIFIED  Final   Special Requests   Final    BOTTLES DRAWN AEROBIC ONLY Blood Culture results may not be optimal due to an inadequate volume of blood received in culture bottles   Culture   Final    NO GROWTH 2 DAYS Performed at [facility], [address] [place], [place] [address]    Report Status PENDING  Incomplete  MRSA Next Gen by PCR, Nasal     Status: None   Collection Time: 09/19/23  1:51 AM   Specimen: Nasal Mucosa; Nasal Swab  Result Value Ref Range Status   MRSA by PCR Next Gen NOT DETECTED NOT DETECTED Final    Comment: (NOTE) The GeneXpert MRSA Assay (FDA approved for NASAL specimens only), is one component of a comprehensive MRSA colonization surveillance program. It is not intended to diagnose MRSA infection nor to guide or monitor treatment for MRSA infections. Test performance is not FDA approved in patients less than 98 years old. Performed at [facility], [address] [place], [place] [address]          Radiology Studies: DG Chest Portable 1 View Result Date: 09/17/2023 CLINICAL DATA:  Altered level of consciousness, hypotension EXAM: PORTABLE CHEST 1 VIEW COMPARISON:  03/24/2021 FINDINGS: The heart size and mediastinal contours are within normal limits. Both lungs are clear. The visualized skeletal structures are unremarkable. IMPRESSION: No active disease. Electronically Signed   By: Sharlet Salina M.D.   On: 09/17/2023 17:50           LOS: 2 days   Time spent= 35 mins    Miguel Rota, MD [facility]  If 7PM-7AM, please contact night-coverage  09/19/2023,  1:59 PM

## 2023-09-19 NOTE — Anesthesia Postprocedure Evaluation (Signed)
 Anesthesia Post Note  Patient: Allen Russell  Procedure(s) Performed: EGD (ESOPHAGOGASTRODUODENOSCOPY)     Patient location during evaluation: PACU Anesthesia Type: MAC Level of consciousness: awake and alert Pain management: pain level controlled Vital Signs Assessment: post-procedure vital signs reviewed and stable Respiratory status: spontaneous breathing Cardiovascular status: stable Anesthetic complications: no   No notable events documented.  Last Vitals:  Vitals:   09/19/23 1040 09/19/23 1151  BP: (!) 107/56 105/62  Pulse: 78   Resp: 15 13  Temp:  36.5 C  SpO2: 100% 98%    Last Pain:  Vitals:   09/19/23 1151  TempSrc: Oral  PainSc:                  Lewie Loron

## 2023-09-19 NOTE — Op Note (Signed)
 [facility] Patient Name: Allen Russell Procedure Date : 09/19/2023 MRN: [id number] Attending MD: Maren Beach , MD, [id number] Date of Birth: 1961-06-24 CSN: [id number] Age: 63 Admit Type: Inpatient Procedure:                Upper GI endoscopy Indications:              Acute post hemorrhagic anemia, Melena Providers:                Maren Beach, MD, Jacquelyn "Jaci" Clelia Croft, RN, Alan Ripper, Technician Referring MD:              Medicines:                Monitored Anesthesia Care Complications:            No immediate complications. Estimated blood loss:                            None. Estimated Blood Loss:     Estimated blood loss: none. Procedure:                Pre-Anesthesia Assessment:                           - Prior to the procedure, a History and Physical                            was performed, and patient medications and                            allergies were reviewed. The patient's tolerance of                            previous anesthesia was also reviewed. The risks                            and benefits of the procedure and the sedation                            options and risks were discussed with the patient.                            All questions were answered, and informed consent                            was obtained. Prior Anticoagulants: The patient has                            taken no anticoagulant or antiplatelet agents. ASA                            Grade Assessment: IV - A patient with severe  systemic disease that is a constant threat to life.                            After reviewing the risks and benefits, the patient                            was deemed in satisfactory condition to undergo the                            procedure.                           After obtaining informed consent, the endoscope was                            passed under direct vision. Throughout  the                            procedure, the patient's blood pressure, pulse, and                            oxygen saturations were monitored continuously. The                            [id number]-[id number] ([id number]) Olympus endoscope was introduced                            through the mouth, and advanced to the second part                            of duodenum. The upper GI endoscopy was                            accomplished without difficulty. The patient                            tolerated the procedure well. Scope In: Scope Out: Findings:      The upper third of the esophagus and middle third of the esophagus were       normal.      LA Grade C (one or more mucosal breaks continuous between tops of 2 or       more mucosal folds, less than 75% circumference) esophagitis with no       bleeding was found in the lower esophagus      Hematin (altered blood/coffee-ground-like material) was found in the       entire examined stomach. There was no red blood or active bleeding seen       in the stomach during the time of exam. Lavage of the stomach was       performed using a large amount of sterile water, resulting in clearance       with good visualization. The underlying mucosa overall appeared normal.       There was one area of slightly congested heaped up tissue in the gastric       cardia without other abnormality - unclear if this  may have been related       to prior cyst gastrostomy and stenting in the past.      The stomach was normal on retroflexion      The anatomic configuration of the stomach was slightly abnormal with       acute angulation of the prepyloric region to access the duodenum. Views       of this area appeared normal without pathology or bleeding.      The duodenal bulb and second portion of the duodenum were normal. No       active bleeding or stigmata of recent bleeding in the duodenum Impression:               - Normal upper third of esophagus and middle third                             of esophagus.                           - LA Grade C erosive esophagitis with no bleeding.                           - Hematin (altered blood/coffee-ground-like                            material) in the entire stomach.                           - Normal duodenal bulb and second portion of the                            duodenum.                           - No specimens collected.                           - EGD shows evidence of previous bleeding in the                            upper GI tract based upon scattered hematin                            throughout the stomach. The only pathologic finding                            identified was grade C esophagitis which could have                            contributed to bleeding although would be less                            likely to cause hypotension. A Dieulafoy lesion is                            another consideration which could cause  intermittent bleeding leading to an absence of                            significant findings on EGD today. Recommendation:           - Return patient to hospital ward for ongoing care                           - Continue to monitor serial H&H every 8 hours                           - Transfuse to maintain hemoglobin greater than 7                           - Continue IV PPI twice daily for now                           - In the absence of active bleeding at present,                            patient can initiate a clear liquid diet                           - If there is evidence of ongoing active bleeding                            will consider relook endoscopy if patient is                            hemodynamically stable. If patient becomes                            hemodynamically unstable would recommend                            consideration of CT angio.                           - Patient will need a follow-up outpt EGD 8 to 12                             weeks after hospitalization to confirm healing of                            esophagitis and assessment of Barrett's esophagus Procedure Code(s):        --- Professional ---                           [phone], Esophagogastroduodenoscopy, flexible,                            transoral; diagnostic, including collection of                            specimen(s)  by brushing or washing, when performed                            (separate procedure) Diagnosis Code(s):        --- Professional ---                           K20.80, Other esophagitis without bleeding                           K92.2, Gastrointestinal hemorrhage, unspecified                           D62, Acute posthemorrhagic anemia                           K92.1, Melena (includes Hematochezia) CPT copyright 2022 American Medical Association. All rights reserved. The codes documented in this report are preliminary and upon coder review may  be revised to meet current compliance requirements. Maren Beach, MD 09/19/2023 10:21:00 AM This report has been signed electronically. Number of Addenda: 0

## 2023-09-19 NOTE — Plan of Care (Signed)
  Problem: Education: Goal: Ability to describe self-care measures that may prevent or decrease complications (Diabetes Survival Skills Education) will improve Outcome: Progressing   Problem: Fluid Volume: Goal: Ability to maintain a balanced intake and output will improve Outcome: Progressing   Problem: Health Behavior/Discharge Planning: Goal: Ability to identify and utilize available resources and services will improve Outcome: Progressing   Problem: Tissue Perfusion: Goal: Adequacy of tissue perfusion will improve Outcome: Progressing   Problem: Nutrition: Goal: Adequate nutrition will be maintained Outcome: Progressing

## 2023-09-19 NOTE — Progress Notes (Signed)
 Brief GI note  I visited Allen Russell this afternoon postprocedure to follow-up on his clinical status.  Reports that he continues to feel weak particularly when he stood up to use a bedside commode.  Developed an episode of sharp abdominal pain prior to defecation.  His nurse reported that he passed melena.  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  Continue to follow serial H&H every 6-8 hours and continue transfusions with careful monitoring of volume status.  Maren Beach, MD [facility]

## 2023-09-19 NOTE — Transfer of Care (Signed)
 Immediate Anesthesia Transfer of Care Note  Patient: Allen Russell  Procedure(s) Performed: EGD (ESOPHAGOGASTRODUODENOSCOPY)  Patient Location: PACU  Anesthesia Type:General  Level of Consciousness: awake, alert , and oriented  Airway & Oxygen Therapy: Patient Spontanous Breathing  Post-op Assessment: Report given to RN and Post -op Vital signs reviewed and stable  Post vital signs: Reviewed and stable  Last Vitals:  Vitals Value Taken Time  BP 89/37 09/19/23 1006  Temp 36.6 C 09/19/23 1006  Pulse 78 09/19/23 1006  Resp 16 09/19/23 1009  SpO2 100 % 09/19/23 1006  Vitals shown include unfiled device data.  Last Pain:  Vitals:   09/19/23 1006  TempSrc: Temporal  PainSc: 0-No pain      Patients Stated Pain Goal: 0 (09/19/23 0907)  Complications: No notable events documented.

## 2023-09-19 NOTE — Plan of Care (Signed)
   Problem: Education: Goal: Ability to describe self-care measures that may prevent or decrease complications (Diabetes Survival Skills Education) will improve Outcome: Progressing

## 2023-09-20 ENCOUNTER — Inpatient Hospital Stay (HOSPITAL_COMMUNITY)

## 2023-09-20 DIAGNOSIS — D62 Acute posthemorrhagic anemia: Secondary | ICD-10-CM | POA: Diagnosis not present

## 2023-09-20 DIAGNOSIS — K921 Melena: Secondary | ICD-10-CM | POA: Diagnosis not present

## 2023-09-20 DIAGNOSIS — R7989 Other specified abnormal findings of blood chemistry: Secondary | ICD-10-CM | POA: Diagnosis not present

## 2023-09-20 DIAGNOSIS — I959 Hypotension, unspecified: Secondary | ICD-10-CM | POA: Diagnosis not present

## 2023-09-20 DIAGNOSIS — R109 Unspecified abdominal pain: Secondary | ICD-10-CM

## 2023-09-20 LAB — GLUCOSE, CAPILLARY
Glucose-Capillary: 131 mg/dL — ABNORMAL HIGH (ref 70–99)
Glucose-Capillary: 187 mg/dL — ABNORMAL HIGH (ref 70–99)
Glucose-Capillary: 59 mg/dL — ABNORMAL LOW (ref 70–99)
Glucose-Capillary: 79 mg/dL (ref 70–99)
Glucose-Capillary: 119 mg/dL — ABNORMAL HIGH (ref 70–99)

## 2023-09-20 LAB — PREPARE RBC (CROSSMATCH)

## 2023-09-20 LAB — HEMOGLOBIN AND HEMATOCRIT, BLOOD
HCT: 19.1 % — ABNORMAL LOW (ref 39.0–52.0)
HCT: 20.1 % — ABNORMAL LOW (ref 39.0–52.0)
HCT: 21.2 % — ABNORMAL LOW (ref 39.0–52.0)
Hemoglobin: 6.9 g/dL — CL (ref 13.0–17.0)
Hemoglobin: 7.2 g/dL — ABNORMAL LOW (ref 13.0–17.0)
Hemoglobin: 7.6 g/dL — ABNORMAL LOW (ref 13.0–17.0)

## 2023-09-20 MED ORDER — ACETAMINOPHEN 500 MG PO TABS
1000.0000 mg | ORAL_TABLET | Freq: Once | ORAL | Status: AC
  Administered 2023-09-20: 1000 mg via ORAL
  Filled 2023-09-20: qty 2

## 2023-09-20 MED ORDER — IRON SUCROSE 200 MG IVPB - SIMPLE MED
200.0000 mg | Status: DC
  Administered 2023-09-20: 200 mg via INTRAVENOUS
  Filled 2023-09-20: qty 110
  Filled 2023-09-20: qty 200
  Filled 2023-09-20: qty 110

## 2023-09-20 MED ORDER — IOHEXOL 350 MG/ML SOLN
100.0000 mL | Freq: Once | INTRAVENOUS | Status: AC | PRN
  Administered 2023-09-20: 100 mL via INTRAVENOUS

## 2023-09-20 MED ORDER — SODIUM CHLORIDE 0.9% IV SOLUTION: Freq: Once | INTRAVENOUS | Status: DC

## 2023-09-20 NOTE — Progress Notes (Signed)
 PROGRESS NOTE    Allen Russell  ZOX:[id number] DOB: 19-Feb-1961 DOA: 09/17/2023 PCP: [name], PA-C    Brief Narrative:    63 y.o. 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  Hemoglobin 5.4. Endoscopy showing erosive esophagitis with no active bleeding at this time.  IV PPI twice daily, serial H&H.  Continue aggressive transfusion as necessary.  Severely low iron, will give IV iron as well.  Waiting on morning labs, we may have to proceed with CTA for RBC tagged scan   CKD stage IIIa In the past 2 years creatinine has been around 2.2.  Patient does not have AKI  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 A1c is 7.2, reasonable control Continue long-acting and sliding scale.  Adjust as necessary   Chronic pancreatitis with prior history of necrotizing pancreatitis Continue Creon     DVT prophylaxis: SCDs    Code Status: Do not attempt resuscitation (DNR) PRE-ARREST INTERVENTIONS DESIRED Family Communication:   Status is: Inpatient Remains inpatient appropriate because: Ongoing GI evaluation    Subjective: Still having dark tarry stool.   Examination:  General exam: Appears calm  and comfortable, appears pale Respiratory system: Clear to auscultation. Respiratory effort normal. Cardiovascular system: S1 & S2 heard, RRR. No JVD, murmurs, rubs, gallops or clicks. No pedal edema. Gastrointestinal system: Abdomen is nondistended, soft and nontender. No organomegaly or masses felt. Normal bowel sounds heard. Central nervous system: Alert and oriented. No focal neurological deficits. Extremities: Symmetric 5 x 5 power. Skin: No rashes, lesions or ulcers Psychiatry: Judgement and insight appear normal. Mood & affect appropriate.                Diet Orders (From admission, onward)     Start     Ordered   09/19/23 1046  Diet clear liquid Room service appropriate? Yes; Fluid consistency: Thin  Diet effective now       Question Answer Comment  Room service appropriate? Yes   Fluid consistency: Thin      09/19/23 1045            Objective: Vitals:   09/20/23 0800 09/20/23 0901 09/20/23 0916 09/20/23 1142  BP: (!) 105/58 (!) 106/54 (!) 103/53 (!) 103/58  Pulse: 69 80 74 67  Resp: 12 15 16 15   Temp: 97.7 F (36.5 C) 98.9 F (37.2 C) 97.6 F (36.4 C) 97.8 F (36.6 C)  TempSrc: Oral Oral  Oral  SpO2: 97%   100%  Weight:        Intake/Output Summary (Last 24 hours) at 09/20/2023 1155 Last data filed at 09/20/2023 1140 Gross per 24 hour  Intake 1666.25 ml  Output 2675 ml  Net -1008.75 ml   Filed Weights   09/19/23 0500 09/20/23 0500  Weight: 96.6 kg 98.9 kg    Scheduled Meds:  sodium chloride   Intravenous Once   insulin aspart  0-20 Units Subcutaneous TID WC   insulin aspart  0-5 Units Subcutaneous QHS   insulin glargine  12 Units Subcutaneous Q2200   lipase/protease/amylase  72,000 Units Oral TID WC   pantoprazole (PROTONIX) IV  40 mg Intravenous Q12H   pneumococcal 20-valent conjugate vaccine  0.5 mL Intramuscular Tomorrow-1000   sodium chloride flush  3 mL Intravenous Q12H   Continuous Infusions:  iron sucrose      Nutritional  status     Body mass index is 26.54 kg/m.  Data Reviewed:   CBC: Recent Labs  Lab 09/17/23 1715 09/18/23 0617 09/18/23 1943 09/19/23 1142 09/19/23 2319 09/20/23 0816  WBC 10.4 5.7 8.6  --   --   --   NEUTROABS 8.6*  --  4.9  --   --   --   HGB 8.7* 5.4* 6.5* 5.8* 6.9* 7.2*  HCT 25.1* 16.2* 18.5* 16.8* 19.1* 20.1*  MCV 89.3 90.5 86.9  --   --   --   PLT 218 153 174  --   --   --    Basic Metabolic Panel: Recent Labs  Lab 09/17/23 1715 09/18/23 0507 09/19/23 2319  NA 133* 135 137  K 5.8* 4.2 3.8  CL 101 109 109  CO2 21* 20* 22  GLUCOSE 219* 256* 186*  BUN 67* 65* 56*  CREATININE 1.92* 1.79* 1.74*  CALCIUM 8.4* 7.5* 7.3*  MG  --   --  1.6*   GFR: CrCl cannot be calculated (Unknown ideal weight.). Liver Function Tests: Recent Labs  Lab 09/17/23 1715  AST 31  ALT 34  ALKPHOS 45  BILITOT 0.5  PROT 5.1*  ALBUMIN 3.0*   Recent Labs  Lab 09/17/23 1715  LIPASE 19   No results for input(s): "AMMONIA" in the last 168 hours. Coagulation Profile: No results for input(s): "INR", "PROTIME" in the last 168 hours. Cardiac Enzymes: No results for input(s): "CKTOTAL", "CKMB", "CKMBINDEX", "TROPONINI" in the last 168 hours. BNP (last 3 results) No results for input(s): "PROBNP" in the last 8760 hours. HbA1C: Recent Labs    09/18/23 0507  HGBA1C 7.2*   CBG: Recent Labs  Lab 09/19/23 0843 09/19/23 1144 09/19/23 1721 09/19/23 2116 09/20/23 0810  GLUCAP 147* 157* 173* 184* 131*   Lipid Profile: No results for input(s): "CHOL", "HDL", "LDLCALC", "TRIG", "CHOLHDL", "LDLDIRECT" in the last 72 hours. Thyroid Function Tests: Recent Labs    09/17/23 1715  TSH 1.809   Anemia Panel: Recent Labs    09/18/23 0507  VITAMINB12 471  FOLATE 21.8  FERRITIN 14*  TIBC 246*  IRON 48   Sepsis Labs: Recent Labs  Lab 09/17/23 1722 09/17/23 2051  LATICACIDVEN 2.4* 1.5    Recent Results (from the past 240 hours)  Resp panel by RT-PCR (RSV, Flu A&B, Covid)      Status: None   Collection Time: 09/17/23  4:52 PM   Specimen: Nasal Swab  Result Value Ref Range Status   SARS Coronavirus 2 by RT PCR NEGATIVE NEGATIVE Final   Influenza A by PCR NEGATIVE NEGATIVE Final   Influenza B by PCR NEGATIVE NEGATIVE Final    Comment: (NOTE) The Xpert Xpress SARS-CoV-2/FLU/RSV plus assay is intended as an aid in the diagnosis of influenza from Nasopharyngeal swab specimens and should not be used as a sole basis for treatment. Nasal washings and aspirates are unacceptable for Xpert Xpress SARS-CoV-2/FLU/RSV testing.  Fact Sheet for Patients: BloggerCourse.com  Fact Sheet  for Healthcare Providers: SeriousBroker.it  This test is not yet approved or cleared by the [place] and has been authorized for detection and/or diagnosis of SARS-CoV-2 by FDA under an Emergency Use Authorization (EUA). This EUA will remain in effect (meaning this test can be used) for the duration of the COVID-19 declaration under Section 564(b)(1) of the Act, 21 U.S.C. section 360bbb-3(b)(1), unless the authorization is terminated or revoked.     Resp Syncytial Virus by PCR NEGATIVE NEGATIVE Final    Comment: (NOTE) Fact Sheet for Patients: BloggerCourse.com  Fact Sheet for Healthcare Providers: SeriousBroker.it  This test is not yet approved or cleared by the [place] FDA and has been authorized for detection and/or diagnosis of SARS-CoV-2 by FDA under an Emergency Use Authorization (EUA). This EUA will remain in effect (meaning this test can be used) for the duration of the COVID-19 declaration under Section 564(b)(1) of the Act, 21 U.S.C. section 360bbb-3(b)(1), unless the authorization is terminated or revoked.  Performed at [facility], [address], [place], [place] [address]   Blood culture (routine x 2)     Status: None (Preliminary result)    Collection Time: 09/17/23  5:00 PM   Specimen: BLOOD  Result Value Ref Range Status   Specimen Description BLOOD SITE NOT SPECIFIED  Final   Special Requests   Final    BOTTLES DRAWN AEROBIC AND ANAEROBIC Blood Culture results may not be optimal due to an inadequate volume of blood received in culture bottles   Culture   Final    NO GROWTH 3 DAYS Performed at [facility], [address] [place], [place] [address]    Report Status PENDING  Incomplete  Blood culture (routine x 2)     Status: None (Preliminary result)   Collection Time: 09/17/23  6:45 PM   Specimen: BLOOD  Result Value Ref Range Status   Specimen Description BLOOD SITE NOT SPECIFIED  Final   Special Requests   Final    BOTTLES DRAWN AEROBIC ONLY Blood Culture results may not be optimal due to an inadequate volume of blood received in culture bottles   Culture   Final    NO GROWTH 3 DAYS Performed at [facility], [address], [place], [place] [address]    Report Status PENDING  Incomplete  MRSA Next Gen by PCR, Nasal     Status: None   Collection Time: 09/19/23  1:51 AM   Specimen: Nasal Mucosa; Nasal Swab  Result Value Ref Range Status   MRSA by PCR Next Gen NOT DETECTED NOT DETECTED Final    Comment: (NOTE) The GeneXpert MRSA Assay (FDA approved for NASAL specimens only), is one component of a comprehensive MRSA colonization surveillance program. It is not intended to diagnose MRSA infection nor to guide or monitor treatment for MRSA infections. Test performance is not FDA approved in patients less than 65 years old. Performed at [facility], [address], [place], [place] [address]          Radiology Studies: No results found.         LOS: 3 days   Time spent= 35 mins    Miguel Rota, MD [facility]  If 7PM-7AM, please contact night-coverage  09/20/2023, 11:55 AM

## 2023-09-20 NOTE — Progress Notes (Signed)
 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    Interval History   -- EGD 09/19/2023: LA Grade C esophagitis, scattered old hematin but no active bleeding -- Has received 6 units of PRBCs thus far -- Hemoglobin has been slow to respond to transfusion 5.8 --> 6.9 --> 7.2 --> 7.6 -- Continues to have melena and intermittent abdominal pain    Objective   Vital signs in last 24 hours: Temp:  [97.6 F (36.4 C)-98.9 F (37.2 C)] 97.9 F (36.6 C) (03/20 1600) Pulse Rate:  [67-90] 67 (03/20 1142) Resp:  [11-18] 18 (03/20 1600) BP: (98-111)/(53-59) 103/58 (03/20 1142) SpO2:  [97 %-100 %] 100 % (03/20 1600) Weight:  [98.9 kg] 98.9 kg (03/20 0500) Last BM Date : 09/19/23 General:    Pale, resting in bed, fatigued but no distress Heart:  Regular rate and rhythm; no murmurs Lungs: Respirations even and unlabored, lungs CTA bilaterally Abdomen:  Soft, tender in mid abdomen without rebound or guarding and nondistended. Normal bowel sounds. Extremities:  Without edema. Neurologic:  Alert and oriented,  grossly normal neurologically. Psych:  Cooperative. Normal mood and affect.  Intake/Output from previous day: 03/19 0701 - 03/20 0700 In: 1896 [P.O.:480; I.V.:25; Blood:1141; IV Piggyback:250] Out: 3275 [Urine:3275] Intake/Output this shift: Total I/O In: 331.3 [Blood:331.3] Out: -   Lab Results: Recent Labs    09/18/23 0617 09/18/23 1943 09/19/23 1142 09/19/23 2319 09/20/23 0816 09/20/23 1703  WBC 5.7 8.6  --   --   --   --   HGB 5.4* 6.5*   < > 6.9* 7.2* 7.6*  HCT 16.2* 18.5*   < > 19.1* 20.1* 21.2*  PLT 153 174  --   --   --   --     < > = values in this interval not displayed.   BMET Recent Labs    09/18/23 0507 09/19/23 2319  NA 135 137  K 4.2 3.8  CL 109 109  CO2 20* 22  GLUCOSE 256* 186*  BUN 65* 56*  CREATININE 1.79* 1.74*  CALCIUM 7.5* 7.3*   LFT No results for input(s): "PROT", "ALBUMIN", "AST", "ALT", "ALKPHOS", "BILITOT", "BILIDIR", "IBILI" in the last 72 hours. PT/INR No results for input(s): "LABPROT", "INR" in the last 72 hours.  Studies/Results: No results found.  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   EGD 09/19/2023 showed LA grade C esophagitis and scattered old hematin throughout the stomach but no active bleeding.   Patient has received 6 units of PRBCs this admission with an appropriate increase in hemoglobin.  He is reporting symptoms of abdominal pain with ongoing melena.  EGD findings and elevated BUN to creatinine ratio suggest upper GI source of bleeding. Given his history of complicated pancreatic adequate biliary disease with previous pancreatic necrosis s/p necrosectomy/ cystgastrostomy and splenic vein thrombosis a pancreatic pseudoaneurysm causing abdominal pain and bleeding is in the differential diagnosis.  Discussed with patient  and hospital team proceeding with CT angiography today.  Patient has baseline renal dysfunction and reviewed potential risks of contrast.  In the setting of ongoing bleeding advised that I feel the benefits outweigh the risks at this juncture and patient is in agreement.   Plan  CT angiography ordered for today Continue to monitor serial H&H every 6-8 hours Transfuse to maintain hemoglobin greater than  7 Monitor hemoglobin and hematocrit Continue Protonix 40 mg IV twice daily Monitor hemodynamics Continue clear liquid diet    LOS: 3 days   Ottie Glazier  09/20/2023, 5:53 PM  Maren Beach, MD  GI

## 2023-09-20 NOTE — Plan of Care (Signed)

## 2023-09-21 ENCOUNTER — Inpatient Hospital Stay (HOSPITAL_COMMUNITY): Admitting: Anesthesiology

## 2023-09-21 ENCOUNTER — Encounter (HOSPITAL_COMMUNITY)
Admission: EM | Disposition: A | Discharge status: Home / Self Care | Payer: Self-pay | Source: Home / Self Care | Attending: Internal Medicine

## 2023-09-21 ENCOUNTER — Encounter (HOSPITAL_COMMUNITY): Payer: Self-pay | Admitting: Pediatrics

## 2023-09-21 DIAGNOSIS — E119 Type 2 diabetes mellitus without complications: Secondary | ICD-10-CM

## 2023-09-21 DIAGNOSIS — I8500 Esophageal varices without bleeding: Secondary | ICD-10-CM

## 2023-09-21 DIAGNOSIS — D62 Acute posthemorrhagic anemia: Secondary | ICD-10-CM | POA: Diagnosis not present

## 2023-09-21 DIAGNOSIS — K209 Esophagitis, unspecified without bleeding: Secondary | ICD-10-CM

## 2023-09-21 DIAGNOSIS — Z794 Long term (current) use of insulin: Secondary | ICD-10-CM

## 2023-09-21 HISTORY — PX: ENTEROSCOPY: SHX5533

## 2023-09-21 HISTORY — PX: GIVENS CAPSULE STUDY: SHX5432

## 2023-09-21 LAB — BASIC METABOLIC PANEL
Anion gap: 5 (ref 5–15)
BUN: 29 mg/dL — ABNORMAL HIGH (ref 6–20)
CO2: 24 mmol/L (ref 22–32)
Calcium: 7.2 mg/dL — ABNORMAL LOW (ref 8.9–10.3)
Chloride: 110 mmol/L (ref 98–111)
Creatinine, Ser: 1.7 mg/dL — ABNORMAL HIGH (ref 0.61–1.24)
GFR, Estimated: 46 mL/min — ABNORMAL LOW (ref >60)
Glucose, Bld: 87 mg/dL (ref 70–99)
Potassium: 3.2 mmol/L — ABNORMAL LOW (ref 3.5–5.1)
Sodium: 139 mmol/L (ref 135–145)

## 2023-09-21 LAB — HEMOGLOBIN AND HEMATOCRIT, BLOOD
HCT: 18.4 % — ABNORMAL LOW (ref 39.0–52.0)
HCT: 19.6 % — ABNORMAL LOW (ref 39.0–52.0)
HCT: 27.9 % — ABNORMAL LOW (ref 39.0–52.0)
Hemoglobin: 6.6 g/dL — CL (ref 13.0–17.0)
Hemoglobin: 6.9 g/dL — CL (ref 13.0–17.0)
Hemoglobin: 9.7 g/dL — ABNORMAL LOW (ref 13.0–17.0)

## 2023-09-21 LAB — CBC
HCT: 18.6 % — ABNORMAL LOW (ref 39.0–52.0)
Hemoglobin: 6.6 g/dL — CL (ref 13.0–17.0)
MCH: 30.4 pg (ref 26.0–34.0)
MCHC: 35.5 g/dL (ref 30.0–36.0)
MCV: 85.7 fL (ref 80.0–100.0)
Platelets: 112 10*3/uL — ABNORMAL LOW (ref 150–400)
RBC: 2.17 MIL/uL — ABNORMAL LOW (ref 4.22–5.81)
RDW: 15.9 % — ABNORMAL HIGH (ref 11.5–15.5)
WBC: 3.8 10*3/uL — ABNORMAL LOW (ref 4.0–10.5)
nRBC: 0.5 % — ABNORMAL HIGH (ref 0.0–0.2)

## 2023-09-21 LAB — PREPARE RBC (CROSSMATCH)

## 2023-09-21 LAB — GLUCOSE, CAPILLARY
Glucose-Capillary: 75 mg/dL (ref 70–99)
Glucose-Capillary: 92 mg/dL (ref 70–99)
Glucose-Capillary: 87 mg/dL (ref 70–99)
Glucose-Capillary: 100 mg/dL — ABNORMAL HIGH (ref 70–99)

## 2023-09-21 LAB — MAGNESIUM: Magnesium: 1.9 mg/dL (ref 1.7–2.4)

## 2023-09-21 SURGERY — IMAGING PROCEDURE, GI TRACT, INTRALUMINAL, VIA CAPSULE
Anesthesia: LOCAL

## 2023-09-21 SURGERY — ENTEROSCOPY
Anesthesia: General

## 2023-09-21 MED ORDER — POTASSIUM CHLORIDE 10 MEQ/100ML IV SOLN
10.0000 meq | INTRAVENOUS | Status: AC
  Administered 2023-09-21 (×4): 10 meq via INTRAVENOUS
  Filled 2023-09-21 (×4): qty 100

## 2023-09-21 MED ORDER — SODIUM CHLORIDE 0.9 % IV SOLN: INTRAVENOUS | Status: DC | PRN

## 2023-09-21 MED ORDER — SCOPOLAMINE 1 MG/3DAYS TD PT72
1.0000 | MEDICATED_PATCH | TRANSDERMAL | Status: DC
  Administered 2023-09-21: 1.5 mg via TRANSDERMAL
  Filled 2023-09-21 (×2): qty 1

## 2023-09-21 MED ORDER — SODIUM CHLORIDE 0.9% IV SOLUTION: Freq: Once | INTRAVENOUS | Status: AC

## 2023-09-21 MED ORDER — HYDROMORPHONE HCL 1 MG/ML IJ SOLN
0.5000 mg | INTRAMUSCULAR | Status: DC | PRN
  Administered 2023-09-21 – 2023-09-22 (×4): 0.5 mg via INTRAVENOUS
  Filled 2023-09-21 (×4): qty 0.5

## 2023-09-21 MED ORDER — PHENYLEPHRINE HCL-NACL 20-0.9 MG/250ML-% IV SOLN
INTRAVENOUS | Status: DC | PRN
  Administered 2023-09-21: 30 ug/min via INTRAVENOUS

## 2023-09-21 MED ORDER — SODIUM CHLORIDE 0.9% IV SOLUTION: Freq: Once | INTRAVENOUS | Status: DC

## 2023-09-21 MED ORDER — LIDOCAINE 2% (20 MG/ML) 5 ML SYRINGE
INTRAMUSCULAR | Status: DC | PRN
  Administered 2023-09-21: 80 mg via INTRAVENOUS

## 2023-09-21 MED ORDER — PROPOFOL 10 MG/ML IV BOLUS
INTRAVENOUS | Status: DC | PRN
  Administered 2023-09-21: 80 mg via INTRAVENOUS
  Administered 2023-09-21: 40 mg via INTRAVENOUS
  Administered 2023-09-21: 20 mg via INTRAVENOUS

## 2023-09-21 MED ORDER — PROPOFOL 500 MG/50ML IV EMUL
INTRAVENOUS | Status: DC | PRN
  Administered 2023-09-21: 100 ug/kg/min via INTRAVENOUS

## 2023-09-21 MED ORDER — SPOT INK MARKER SYRINGE KIT
PACK | SUBMUCOSAL | Status: DC | PRN
  Administered 2023-09-21: 2 mL via SUBMUCOSAL

## 2023-09-21 MED ORDER — DEXTROSE-SODIUM CHLORIDE 5-0.45 % IV SOLN: INTRAVENOUS | Status: DC

## 2023-09-21 MED ORDER — SPOT INK MARKER SYRINGE KIT
PACK | SUBMUCOSAL | Status: AC
  Filled 2023-09-21: qty 5

## 2023-09-21 MED ORDER — SODIUM CHLORIDE 0.9 % IV SOLN
200.0000 mg | INTRAVENOUS | Status: AC
  Administered 2023-09-21 – 2023-09-22 (×2): 200 mg via INTRAVENOUS
  Filled 2023-09-21 (×2): qty 10

## 2023-09-21 SURGICAL SUPPLY — 1 items: TOWEL COTTON PACK 4EA (MISCELLANEOUS) ×6 IMPLANT

## 2023-09-21 NOTE — Progress Notes (Signed)
 [facility] night cross cover note:     I was notified by RN of the patient's updated hemoglobin level this morning is 6.6, which appears to be unchanged from Hgb level of 6.6 at midnight, and down slightly from Hgb of 7.6 at 1700 on 3/20.    Most recent vital signs notable for heart rates in the 70's, while SBP's remain in the low 100's, consistent with BP's over at least the last 30 hours.   Patient noted to have an active type and screen.    I subsequently ordered  transfusion of 1 unit PRBC over 3 hours followed by repeat H&H to be checked following completion of this transfusion.      Newton Pigg, DO Hospitalist

## 2023-09-21 NOTE — Anesthesia Preprocedure Evaluation (Addendum)
 Anesthesia Evaluation  Patient identified by MRN, date of birth, ID band Patient awake    Reviewed: Allergy & Precautions, NPO status , Patient's Chart, lab work & pertinent test results, reviewed documented beta blocker date and time   History of Anesthesia Complications Negative for: history of anesthetic complications  Airway Mallampati: II  TM Distance: >3 FB     Dental  (+) Partial Upper   Pulmonary neg sleep apnea, neg COPD, neg PE   breath sounds clear to auscultation       Cardiovascular (-) hypertension(-) angina (-) CAD and (-) Past MI  Rhythm:Regular Rate:Normal  Last TTE low normal LV, mildly reduced RV function   Neuro/Psych  Headaches, neg Seizures    GI/Hepatic PUD,GERD  Medicated and Controlled,,Chronic pancreatitis   Endo/Other  diabetes, Type 2, Insulin Dependent    Renal/GU CRFRenal disease     Musculoskeletal  (+) Arthritis ,    Abdominal   Peds  Hematology  (+) Blood dyscrasia, anemia S/p multiple PRBCs this admission   Anesthesia Other Findings Adrenal insufficiency  Reproductive/Obstetrics                             Anesthesia Physical Anesthesia Plan  ASA: 3  Anesthesia Plan: General   Post-op Pain Management:    Induction: Intravenous  PONV Risk Score and Plan: 2 and Ondansetron and Propofol infusion  Airway Management Planned: Nasal Cannula and Natural Airway  Additional Equipment:   Intra-op Plan:   Post-operative Plan:   Informed Consent: I have reviewed the patients History and Physical, chart, labs and discussed the procedure including the risks, benefits and alternatives for the proposed anesthesia with the patient or authorized representative who has indicated his/her understanding and acceptance.     Dental advisory given  Plan Discussed with: CRNA  Anesthesia Plan Comments: (Hgb 6.9 despite continued resuscitation. Will transfuse 1u  PRBC preop/intra-op.  )       Anesthesia Quick Evaluation

## 2023-09-21 NOTE — Plan of Care (Signed)

## 2023-09-21 NOTE — Plan of Care (Signed)

## 2023-09-21 NOTE — Progress Notes (Signed)
 Hgb is 6.6.  On call provider notified.  Will continue to monitor.

## 2023-09-21 NOTE — Progress Notes (Signed)
 [employer] Pill cam delivered during the upper endoscopic procedure today at 3:14 pm.   Patient is to remain NPO for 2 hours, then: Can have clear liquids at 5:14pm,  Can have a light snack at 7:14pm,  Resume previously ordered diet at 11:14 pm.   Leads and device can be removed at 3:14 am and placed in patient belonging bag.

## 2023-09-21 NOTE — Progress Notes (Signed)
 PROGRESS NOTE    Allen Russell  ZOX:[id number] DOB: Oct 18, 1960 DOA: 09/17/2023 PCP: [name], PA-C    Brief Narrative:    63 y.o. 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   Upon admission endoscopy showed erosive esophagitis but no active bleeding.  Continued to have melanotic stool with drop in hemoglobin requiring multiple transfusion therefore obtain CTA which was negative.  GI planning on EGD with enteroscopy today.  Assessment & Plan:  Principal Problem:   ABLA (acute blood loss anemia) Active Problems:   Acute kidney injury superimposed on chronic kidney disease (HCC)   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)   Hypotension   Chronic pancreatitis (HCC)   Syncope and collapse   Melena    Acute blood loss anemia secondary erosive esophagitis Hypertension/syncope - Concerns for upper GI bleed with hemoglobin of 5.4.  Endoscopy showed erosive esophagitis but no active bleeding.  Continues to drop his hemoglobin requiring multiple units of PRBC.  CTA negative for any active bleeding.  GI will plan on EGD with enteroscopy today.  Possibly may need VCE and/or colonoscopy.  Defer further management to GI. -Made him n.p.o. for procedure later today -IV iron ordered to help with rapid increase in hemoglobin  Hypokalemia - Aggressive repletion   CKD stage IIIa In the past 2 years creatinine has been around 2.2.  Patient does not have AKI  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 A1c is 7.2, reasonable control Continue long-acting and sliding scale.  Adjust as necessary    Chronic pancreatitis with prior history of necrotizing pancreatitis Continue Creon     DVT prophylaxis: SCDs    Code Status: Do not attempt resuscitation (DNR) PRE-ARREST INTERVENTIONS DESIRED Family Communication:   Status is: Inpatient Remains inpatient appropriate because: Ongoing GI evaluation.  Likely will be in the hospital for next 3-4 days    Subjective: Tells me his dark stools are improving.  No other complaints besides some nausea this morning   Examination:  General exam: Appears calm and comfortable, appears pale Respiratory system: Clear to auscultation. Respiratory effort normal. Cardiovascular system: S1 & S2 heard, RRR. No JVD, murmurs, rubs, gallops or clicks. No pedal edema. Gastrointestinal system: Abdomen is nondistended, soft and nontender. No organomegaly or masses felt. Normal bowel sounds heard. Central nervous system: Alert and oriented. No focal neurological deficits. Extremities: Symmetric 5 x 5 power. Skin: No rashes, lesions or ulcers Psychiatry: Judgement and insight appear normal. Mood & affect appropriate.                Diet Orders (From admission, onward)     Start     Ordered   09/21/23 0806  Diet NPO time specified  Diet effective now        09/21/23 0805            Objective: Vitals:   09/21/23 0442 09/21/23 0500 09/21/23 0809 09/21/23 0829  BP: (!) 108/53  114/65 115/62  Pulse: 71  80 71  Resp: 16  18 18   Temp: 98.3 F (36.8 C)  97.6 F (36.4 C) 99.2 F (37.3 C)  TempSrc: Oral  Axillary Axillary  SpO2: 95%  98%   Weight:  97.8 kg  Intake/Output Summary (Last 24 hours) at 09/21/2023 1125 Last data filed at 09/21/2023 0754 Gross per 24 hour  Intake 442.75 ml  Output --  Net 442.75 ml   Filed Weights   09/19/23 0500 09/20/23 0500 09/21/23 0500  Weight: 96.6 kg 98.9 kg 97.8 kg    Scheduled Meds:  sodium chloride   Intravenous Once   insulin aspart  0-20 Units Subcutaneous TID WC   insulin aspart   0-5 Units Subcutaneous QHS   insulin glargine  12 Units Subcutaneous Q2200   lipase/protease/amylase  72,000 Units Oral TID WC   pantoprazole (PROTONIX) IV  40 mg Intravenous Q12H   pneumococcal 20-valent conjugate vaccine  0.5 mL Intramuscular Tomorrow-1000   scopolamine  1 patch Transdermal Q72H   sodium chloride flush  3 mL Intravenous Q12H   Continuous Infusions:  dextrose 5 % and 0.45 % NaCl     iron sucrose     potassium chloride 10 mEq (09/21/23 0942)    Nutritional status     Body mass index is 26.24 kg/m.  Data Reviewed:   CBC: Recent Labs  Lab 09/17/23 1715 09/18/23 0617 09/18/23 1943 09/19/23 1142 09/19/23 2319 09/20/23 0816 09/20/23 1703 09/20/23 2359 09/21/23 0445  WBC 10.4 5.7 8.6  --   --   --   --   --  3.8*  NEUTROABS 8.6*  --  4.9  --   --   --   --   --   --   HGB 8.7* 5.4* 6.5*   < > 6.9* 7.2* 7.6* 6.6* 6.6*  HCT 25.1* 16.2* 18.5*   < > 19.1* 20.1* 21.2* 18.4* 18.6*  MCV 89.3 90.5 86.9  --   --   --   --   --  85.7  PLT 218 153 174  --   --   --   --   --  112*   < > = values in this interval not displayed.   Basic Metabolic Panel: Recent Labs  Lab 09/17/23 1715 09/18/23 0507 09/19/23 2319 09/21/23 0445  NA 133* 135 137 139  K 5.8* 4.2 3.8 3.2*  CL 101 109 109 110  CO2 21* 20* 22 24  GLUCOSE 219* 256* 186* 87  BUN 67* 65* 56* 29*  CREATININE 1.92* 1.79* 1.74* 1.70*  CALCIUM 8.4* 7.5* 7.3* 7.2*  MG  --   --  1.6* 1.9   GFR: CrCl cannot be calculated (Unknown ideal weight.). Liver Function Tests: Recent Labs  Lab 09/17/23 1715  AST 31  ALT 34  ALKPHOS 45  BILITOT 0.5  PROT 5.1*  ALBUMIN 3.0*   Recent Labs  Lab 09/17/23 1715  LIPASE 19   No results for input(s): "AMMONIA" in the last 168 hours. Coagulation Profile: No results for input(s): "INR", "PROTIME" in the last 168 hours. Cardiac Enzymes: No results for input(s): "CKTOTAL", "CKMB", "CKMBINDEX", "TROPONINI" in the last 168 hours. BNP (last 3 results) No results  for input(s): "PROBNP" in the last 8760 hours. HbA1C: No results for input(s): "HGBA1C" in the last 72 hours. CBG: Recent Labs  Lab 09/20/23 1212 09/20/23 1608 09/20/23 1645 09/20/23 2116 09/21/23 0841  GLUCAP 187* 59* 79 119* 75   Lipid Profile: No results for input(s): "CHOL", "HDL", "LDLCALC", "TRIG", "CHOLHDL", "LDLDIRECT" in the last 72 hours. Thyroid Function Tests: No results for input(s): "TSH", "T4TOTAL", "FREET4", "T3FREE", "THYROIDAB" in the last 72 hours. Anemia Panel: No results for input(s): "VITAMINB12", "FOLATE", "FERRITIN", "TIBC", "IRON", "RETICCTPCT" in the last 72 hours.  Sepsis Labs: Recent Labs  Lab 09/17/23 1722 09/17/23 2051  LATICACIDVEN 2.4* 1.5    Recent Results (from the past 240 hours)  Resp panel by RT-PCR (RSV, Flu A&B, Covid)     Status: None   Collection Time: 09/17/23  4:52 PM   Specimen: Nasal Swab  Result Value Ref Range Status   SARS Coronavirus 2 by RT PCR NEGATIVE NEGATIVE Final   Influenza A by PCR NEGATIVE NEGATIVE Final   Influenza B by PCR NEGATIVE NEGATIVE Final    Comment: (NOTE) The Xpert Xpress SARS-CoV-2/FLU/RSV plus assay is intended as an aid in the diagnosis of influenza from Nasopharyngeal swab specimens and should not be used as a sole basis for treatment. Nasal washings and aspirates are unacceptable for Xpert Xpress SARS-CoV-2/FLU/RSV testing.  Fact Sheet for Patients: BloggerCourse.com  Fact Sheet for Healthcare Providers: SeriousBroker.it  This test is not yet approved or cleared by the [place] FDA and has been authorized for detection and/or diagnosis of SARS-CoV-2 by FDA under an Emergency Use Authorization (EUA). This EUA will remain in effect (meaning this test can be used) for the duration of the COVID-19 declaration under Section 564(b)(1) of the Act, 21 U.S.C. section 360bbb-3(b)(1), unless the authorization is terminated or revoked.     Resp  Syncytial Virus by PCR NEGATIVE NEGATIVE Final    Comment: (NOTE) Fact Sheet for Patients: BloggerCourse.com  Fact Sheet for Healthcare Providers: SeriousBroker.it  This test is not yet approved or cleared by the [place] FDA and has been authorized for detection and/or diagnosis of SARS-CoV-2 by FDA under an Emergency Use Authorization (EUA). This EUA will remain in effect (meaning this test can be used) for the duration of the COVID-19 declaration under Section 564(b)(1) of the Act, 21 U.S.C. section 360bbb-3(b)(1), unless the authorization is terminated or revoked.  Performed at [facility], [address], [place], [place] [address]   Blood culture (routine x 2)     Status: None (Preliminary result)   Collection Time: 09/17/23  5:00 PM   Specimen: BLOOD  Result Value Ref Range Status   Specimen Description BLOOD SITE NOT SPECIFIED  Final   Special Requests   Final    BOTTLES DRAWN AEROBIC AND ANAEROBIC Blood Culture results may not be optimal due to an inadequate volume of blood received in culture bottles   Culture   Final    NO GROWTH 4 DAYS Performed at [facility], [address], [place], [place] [address]    Report Status PENDING  Incomplete  Blood culture (routine x 2)     Status: None (Preliminary result)   Collection Time: 09/17/23  6:45 PM   Specimen: BLOOD  Result Value Ref Range Status   Specimen Description BLOOD SITE NOT SPECIFIED  Final   Special Requests   Final    BOTTLES DRAWN AEROBIC ONLY Blood Culture results may not be optimal due to an inadequate volume of blood received in culture bottles   Culture   Final    NO GROWTH 4 DAYS Performed at [facility], [address] [place], [place] [address]    Report Status PENDING  Incomplete  MRSA Next Gen by PCR, Nasal     Status: None   Collection Time: 09/19/23  1:51 AM   Specimen: Nasal Mucosa; Nasal Swab  Result Value Ref Range  Status   MRSA by PCR Next Gen NOT DETECTED NOT DETECTED Final    Comment: (NOTE) The GeneXpert MRSA  Assay (FDA approved for NASAL specimens only), is one component of a comprehensive MRSA colonization surveillance program. It is not intended to diagnose MRSA infection nor to guide or monitor treatment for MRSA infections. Test performance is not FDA approved in patients less than 74 years old. Performed at [facility], [address] [place], [place] [address]          Radiology Studies: CT ANGIO GI BLEED Result Date: 09/20/2023 CLINICAL DATA:  GI bleed EXAM: CTA ABDOMEN AND PELVIS WITHOUT AND WITH CONTRAST TECHNIQUE: Multidetector CT imaging of the abdomen and pelvis was performed using the standard protocol during bolus administration of intravenous contrast. Multiplanar reconstructed images and MIPs were obtained and reviewed to evaluate the vascular anatomy. RADIATION DOSE REDUCTION: This exam was performed according to the departmental dose-optimization program which includes automated exposure control, adjustment of the mA and/or kV according to patient size and/or use of iterative reconstruction technique. CONTRAST:  OMNIPAQUE IOHEXOL 350 MG/ML SOLN COMPARISON:  CT chest abdomen pelvis, 12/07/2002 FINDINGS: VASCULAR Normal contour and caliber of the abdominal aorta. No evidence of aneurysm, dissection, or other acute aortic pathology. Standard branching pattern of the abdominal aorta with solitary bilateral renal arteries. Mild aortic atherosclerosis. Bili calcinosis. Review of the MIP images confirms the above findings. NON-VASCULAR Lower Chest: No acute findings. Hepatobiliary: No focal liver abnormality is seen. Status post cholecystectomy. Unchanged postoperative biliary ductal dilatation. Pancreas: Unchanged, severe stigmata of chronic pancreatitis, including coarse calcifications throughout the atrophic pancreatic parenchyma as well as a very scarred and tethered  appearance of tissue about the pancreatic body and tail, encases the celiac vessels and effaces the splenic vein. Extensive collateralization about the left upper quadrant, including large gastroesophageal and mesenteric varices Spleen: Normal in size without significant abnormality. Adrenals/Urinary Tract: Adrenal glands are unremarkable. Kidneys are normal, without renal calculi, solid lesion, or hydronephrosis. Bladder is unremarkable. Stomach/Bowel: Stomach is within normal limits. Appendix appears normal. No evidence of bowel wall thickening, distention, or inflammatory changes. No intraluminal contrast extravasation or other findings to specifically localize reported GI bleeding. Lymphatic: No enlarged abdominal or pelvic lymph nodes. Reproductive: No mass or other significant abnormality. Other: No abdominal wall hernia or abnormality. No ascites. Musculoskeletal: No acute osseous findings. IMPRESSION: 1. No evidence of intraluminal contrast extravasation or other findings to specifically localize reported GI bleeding. 2. Unchanged, severe stigmata of chronic pancreatitis, including effacement of the splenic vein with large gastroesophageal and mesenteric varices. Aortic Atherosclerosis (ICD10-I70.0). Electronically Signed   By: Jearld Lesch M.D.   On: 09/20/2023 19:03           LOS: 4 days   Time spent= 35 mins    Miguel Rota, MD [facility]  If 7PM-7AM, please contact night-coverage  09/21/2023, 11:25 AM

## 2023-09-21 NOTE — Inpatient Diabetes Management (Signed)
 Inpatient Diabetes Program Recommendations  AACE/ADA: New Consensus Statement on Inpatient Glycemic Control (2015)  Target Ranges:  Prepandial:   less than 140 mg/dL      Peak postprandial:   less than 180 mg/dL (1-2 hours)      Critically ill patients:  140 - 180 mg/dL   Lab Results  Component Value Date   GLUCAP 92 09/21/2023   HGBA1C 7.2 (H) 09/18/2023    Review of Glycemic Control  Latest Reference Range & Units 09/20/23 08:10 09/20/23 12:12 09/20/23 16:08 09/20/23 16:45 09/20/23 21:16 09/21/23 08:41 09/21/23 12:04  Glucose-Capillary 70 - 99 mg/dL 829 (H)  Novolog 3 units  187 (H)  Novolog 3 units  59 (L) 79 119 (H) 75 92  (H): Data is abnormally high (L): Data is abnormally low  Diabetes history: DM2 Outpatient Diabetes medications: Tresiba 12 units every day, Novolog 0-5 units TID Current orders for Inpatient glycemic control: Lantus 12 units every day, Novolog 0-20 units TID  Inpatient Diabetes Program Recommendations:    Please consider decreasing correction scale:  Novolog 0-9 units TID  Will continue to follow while inpatient.  Thank you, Dulce Sellar, MSN, CDCES Diabetes Coordinator Inpatient Diabetes Program [phone] (team pager from 8a-5p)

## 2023-09-21 NOTE — H&P (Signed)
 [facility] History and Physical   Primary Care Physician:  [name], PA-C   Reason for Procedure:  Anemia, melena  Plan:    Small bowel enteroscopy and possible video capsule endoscope placement   HPI: Allen Russell is a 63 y.o. male undergoing SBE and possible VCE placement for evaluation of anemia and melena.  Patient was admitted on 09/18/2023 with weakness and acute hemoglobin drop to 5.8.  EGD 09/19/2023 showed LA grade C esophagitis and old hematin throughout the stomach but no active bleeding.  CTA negative.  Patient has continued to have significant GI bleeding and transfusion requirement prompting investigation today.  Had significant nausea earlier today which is abating.  Notes that episodes of abdominal pain are also improving.  Last hemoglobin at 1:00 PM today 6.9 - patient actively receiving 1 unit of PRBCs in the preprocedure area  Past Medical History:  Diagnosis Date   'Light-for-dates' infant with signs of fetal malnutrition 04/12/2021   Adrenal insufficiency (HCC) 04/12/2021   Chronic kidney disease    Per pt, fluctuates between Stage IIIB and IV from April 2023.   Diabetes mellitus without complication (HCC)    Ketoacidosis 03/21/2021   Shock (HCC)     Past Surgical History:  Procedure Laterality Date   APPENDECTOMY     BACK SURGERY     CHOLECYSTECTOMY     ESOPHAGOGASTRODUODENOSCOPY N/A 09/19/2023   Procedure: EGD (ESOPHAGOGASTRODUODENOSCOPY);  Surgeon: Ottie Glazier, MD;  Location: [facility] ENDOSCOPY;  Service: Gastroenterology;  Laterality: N/A;   HERNIA REPAIR      Prior to Admission medications   Medication Sig Start Date End Date Taking? Authorizing Provider  Cholecalciferol (VITAMIN D3) 75 MCG (3000 UT) TABS Take 3,000 Units by mouth daily.   Yes [provider]  cyclobenzaprine (FLEXERIL) 10 MG tablet Take 10 mg by mouth at bedtime as needed for muscle spasms. 09/10/23  Yes [provider]  DESCOVY 200-25 MG tablet Take  1 tablet by mouth daily. 06/12/22  Yes [provider]  diazepam (VALIUM) 2 MG tablet Take 2 mg by mouth at bedtime as needed (for leg cramps). 08/25/22  Yes [provider]  insulin aspart (NOVOLOG) 100 UNIT/ML injection Inject 0-5 Units into the skin 2 (two) times daily as needed (for BG greater than 180). 09/12/23 09/11/24 Yes [provider]  lipase/protease/amylase (CREON) 36000 UNITS CPEP capsule Take 1 capsule (36,000 Units total) by mouth 3 (three) times daily with meals. Patient taking differently: Take 72,000 Units by mouth 3 (three) times daily with meals. Take 2 capsules by mouth three times a day with meals and 1 capsule with snack 09/16/21  Yes Amponsah, Flossie Buffy, MD  Multiple Vitamin (MULTIVITAMIN) tablet Take 1 tablet by mouth daily.   Yes [provider]  tadalafil (CIALIS) 20 MG tablet Take 20 mg by mouth as needed for erectile dysfunction.   Yes [provider]  traZODone (DESYREL) 50 MG tablet Take 1 tablet by mouth daily.   Yes [provider]  TRESIBA FLEXTOUCH 100 UNIT/ML FlexTouch Pen INJECT 12 AT BEDTIME 09/12/23  Yes [provider]  BAQSIMI ONE PACK 3 MG/DOSE POWD Place 1 spray into the nose once. Patient not taking: Reported on 09/17/2023 06/29/23   [provider]  Blood Glucose Monitoring Suppl (TRUE METRIX METER) w/Device KIT Use up to four times daily as directed. 04/07/21   Arrien, York Ram, MD  glucose blood (TRUE METRIX BLOOD GLUCOSE TEST) test strip Use as directed to test blood sugar  08/11/21   Steffanie Rainwater, MD  Insulin Pen Needle 32G X 4 MM MISC Use 1 as directed in the morning, at noon, in the evening, and at bedtime (4 times a day) with insulin 05/30/21   Evlyn Kanner, MD  TRUEplus Lancets 28G MISC Use as directed to test blood sugar 06/28/21   Dolan Amen, MD    Current Facility-Administered Medications  Medication Dose Route Frequency Provider Last Rate Last Admin   [facility]  Hold] 0.9 %  sodium chloride infusion (Manually program via Guardrails IV Fluids)   Intravenous Once Miguel Rota, MD   Held at 09/20/23 0913   0.9 %  sodium chloride infusion (Manually program via Guardrails IV Fluids)   Intravenous Once Mariann Barter, MD       0.9 %  sodium chloride infusion (Manually program via Guardrails IV Fluids)   Intravenous Once Mariann Barter, MD       [facility] Hold] acetaminophen (TYLENOL) tablet 650 mg  650 mg Oral Q6H PRN Charlsie Quest, MD   650 mg at 09/21/23 4010   Or   [MAR Hold] acetaminophen (TYLENOL) suppository 650 mg  650 mg Rectal Q6H PRN Charlsie Quest, MD       dextrose 5 % and 0.45 % NaCl infusion   Intravenous Continuous Amin, Ankit C, MD 75 mL/hr at 09/21/23 1153 New Bag at 09/21/23 1153   [MAR Hold] guaiFENesin (ROBITUSSIN) 100 MG/5ML liquid 5 mL  5 mL Oral Q4H PRN Amin, Ankit C, MD       [MAR Hold] hydrALAZINE (APRESOLINE) injection 10 mg  10 mg Intravenous Q4H PRN Amin, Ankit C, MD       [MAR Hold] HYDROcodone-acetaminophen (NORCO/VICODIN) 5-325 MG per tablet 1-2 tablet  1-2 tablet Oral Q4H PRN Charlsie Quest, MD   2 tablet at 09/20/23 2326   [facility] Hold] HYDROmorphone (DILAUDID) injection 0.5 mg  0.5 mg Intravenous Q3H PRN Amin, Ankit C, MD   0.5 mg at 09/21/23 1037   [MAR Hold] insulin aspart (novoLOG) injection 0-20 Units  0-20 Units Subcutaneous TID WC Charlsie Quest, MD   3 Units at 09/20/23 1253   [MAR Hold] insulin aspart (novoLOG) injection 0-5 Units  0-5 Units Subcutaneous QHS Charlsie Quest, MD   2 Units at 09/18/23 0019   [facility] Hold] insulin glargine (LANTUS) injection 12 Units  12 Units Subcutaneous Q2200 Charlsie Quest, MD   12 Units at 09/20/23 2128   [facility] Hold] ipratropium-albuterol (DUONEB) 0.5-2.5 (3) MG/3ML nebulizer solution 3 mL  3 mL Nebulization Q4H PRN Amin, Ankit C, MD       [MAR Hold] iron sucrose (VENOFER) 200 mg in sodium chloride 0.9 % 100 mL IVPB  200 mg Intravenous Q24H Amin, Ankit C, MD       [MAR Hold]  lipase/protease/amylase (CREON) capsule 72,000 Units  72,000 Units Oral TID WC Darreld Mclean R, MD   72,000 Units at 09/20/23 1640   [MAR Hold] metoprolol tartrate (LOPRESSOR) injection 5 mg  5 mg Intravenous Q4H PRN Amin, Ankit C, MD       [MAR Hold] ondansetron (ZOFRAN) tablet 4 mg  4 mg Oral Q6H PRN Darreld Mclean R, MD   4 mg at 09/20/23 1447   Or   [MAR Hold] ondansetron (ZOFRAN) injection 4 mg  4 mg Intravenous Q6H PRN Darreld Mclean R, MD   4 mg at 09/21/23 0520   [MAR Hold] pantoprazole (PROTONIX) injection 40 mg  40 mg Intravenous Q12H Guenther,  Wilhemina Cash, NP   40 mg at 09/21/23 0925   [MAR Hold] phenol (CHLORASEPTIC) mouth spray 1 spray  1 spray Mouth/Throat PRN Jonah Blue, MD   1 spray at 09/19/23 0528   [MAR Hold] pneumococcal 20-valent conjugate vaccine (PREVNAR 20) injection 0.5 mL  0.5 mL Intramuscular Tomorrow-1000 Jonah Blue, MD       [facility] Hold] potassium chloride 10 mEq in 100 mL IVPB  10 mEq Intravenous Q1 Hr x 5 Amin, Ankit C, MD 100 mL/hr at 09/21/23 1156 10 mEq at 09/21/23 1156   [MAR Hold] scopolamine (TRANSDERM-SCOP) 1 MG/3DAYS 1.5 mg  1 patch Transdermal Q72H Ottie Glazier, MD   1.5 mg at 09/21/23 0925   [MAR Hold] senna-docusate (Senokot-S) tablet 1 tablet  1 tablet Oral QHS PRN Charlsie Quest, MD       [facility] Hold] sodium chloride flush (NS) 0.9 % injection 3 mL  3 mL Intravenous Q12H Darreld Mclean R, MD   3 mL at 09/21/23 0926   [MAR Hold] traZODone (DESYREL) tablet 50 mg  50 mg Oral QHS PRN Amin, Ankit C, MD   50 mg at 09/20/23 2326    Allergies as of 09/17/2023 - Review Complete 09/17/2023  Allergen Reaction Noted   Morphine Other (See Comments) 05/16/2011    Family History  Problem Relation Age of Onset   Breast cancer Mother    Thyroid disease Mother    Breast cancer Sister    Pancreatic cancer Maternal Grandmother    Ovarian cancer Maternal Grandmother    Hypertension Paternal Grandfather     Social History   Socioeconomic History   Marital  status: Single    Spouse name: Not on file   Number of children: Not on file   Years of education: Not on file   Highest education level: Bachelor's degree (e.g., BA, AB, BS)  Occupational History   Not on file  Tobacco Use   Smoking status: Never   Smokeless tobacco: Never  Vaping Use   Vaping status: Never Used  Substance and Sexual Activity   Alcohol use: Not Currently    Comment: last drink in 2020   Drug use: Not Currently    Comment: 1998 ectasy   Sexual activity: Yes    Partners: Male    Birth control/protection: None  Other Topics Concern   Not on file  Social History Narrative   Not on file   Social Drivers of Health   Financial Resource Strain: Not on file  Food Insecurity: Food Insecurity Present (09/18/2023)   Hunger Vital Sign    Worried About Running Out of Food in the Last Year: Sometimes true    Ran Out of Food in the Last Year: Sometimes true  Transportation Needs: No Transportation Needs (09/18/2023)   PRAPARE - Administrator, Civil Service (Medical): No    Lack of Transportation (Non-Medical): No  Physical Activity: Not on file  Stress: Not on file  Social Connections: Not on file  Intimate Partner Violence: Not At Risk (09/18/2023)   Humiliation, Afraid, Rape, and Kick questionnaire    Fear of Current or Ex-Partner: No    Emotionally Abused: No    Physically Abused: No    Sexually Abused: No    Review of Systems:  All other review of systems negative except as mentioned in the HPI.  Physical Exam: Vital signs BP 115/68   Pulse 70   Temp 97.6 F (36.4 C) (Temporal)   Resp 16  Ht 6\' 4"  (1.93 m)   Wt 97.8 kg   SpO2 98%   BMI 26.24 kg/m   General:   Fatigued, pale Lungs:  Clear throughout to auscultation.   Heart:  Regular rate and rhythm; no murmurs, clicks, rubs,  or gallops. Abdomen:  Soft, minimal tenderness to palpation diffusely and nondistended. Normal bowel sounds.   Neuro/Psych:  Normal mood and affect. A and O x  3  Maren Beach, MD [facility]

## 2023-09-21 NOTE — Op Note (Signed)
 [facility] Patient Name: Allen Russell Procedure Date : 09/21/2023 MRN: [id number] Attending MD: Maren Beach , MD, [id number] Date of Birth: 1961-04-26 CSN: [id number] Age: 63 Admit Type: Inpatient Procedure:                Small bowel enteroscopy Indications:              Acute post hemorrhagic anemia, Melena, GI bleeding                            source not documented by previous UGI endoscopy,                            patient has received 7 units PRBCs throughout                            hospitalization with inappropriate rise in                            hemoglobin. CT angiogram did not show a site of                            active bleeding. Given inappropriate rise in                            hemoglobin in the setting of melena, a potential                            small bowel source is considered. Providers:                Maren Beach, MD, Rogue Jury, RN, Kandice Robinsons, Technician Referring MD:              Medicines:                Monitored Anesthesia Care Complications:            No immediate complications. Estimated blood loss:                            None. Estimated Blood Loss:     Estimated blood loss: none. Procedure:                Pre-Anesthesia Assessment:                           - Prior to the procedure, a History and Physical                            was performed, and patient medications and                            allergies were reviewed. The patient's tolerance of                            previous anesthesia  was also reviewed. The risks                            and benefits of the procedure and the sedation                            options and risks were discussed with the patient.                            All questions were answered, and informed consent                            was obtained. Prior Anticoagulants: The patient has                            taken no anticoagulant  or antiplatelet agents. ASA                            Grade Assessment: III - A patient with severe                            systemic disease. After reviewing the risks and                            benefits, the patient was deemed in satisfactory                            condition to undergo the procedure.                           After obtaining informed consent, the endoscope was                            passed under direct vision. Throughout the                            procedure, the patient's blood pressure, pulse, and                            oxygen saturations were monitored continuously. The                            PCF-190TL ([id number] Olympus colonoscope was                            introduced through the mouth and advanced to the                            proximal jejunum. The small bowel enteroscopy was                            accomplished without difficulty. The patient  tolerated the procedure well. Scope In: Scope Out: Findings:      The upper third of the esophagus and middle third of the esophagus were       normal.      LA Grade C (one or more mucosal breaks continuous between tops of 2 or       more mucosal folds, less than 75% circumference) esophagitis with no       bleeding was found.      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 There       were no stigmata of recent bleeding. There was a small       erosion/ulceration adjacent to the gastric varices without any bleeding.      The gastric body and gastric antrum were normal.      The cardia (on retroflexion) was normal.      Acute angulation of the prepyloric region was again noted making       intubation of the duodenal bulb challenging. There was no evidence of       significant pathology in the entire examined duodenum. No active       bleeding or stigmata of recent bleeding in the duodenum.      There was no evidence of significant  pathology in the proximal jejunum.       No active bleeding or stigmata of recent bleeding in the jejunum. The       maximal site of insertion was tattooed with an injection of 2 mL of Spot       (carbon black).      Using the endoscope, the video capsule enteroscope was advanced into the       stomach. Multiple attempts were made to advance the video capsule into       the duodenum, however, the acute angulation of the prepyloric region       precluded the ability to do so. Video capsule was deployed in the       stomach. Impression:               - Normal upper third of esophagus and middle third                            of esophagus.                           - LA Grade C erosive esophagitis with no bleeding.                           - Gastric varices, without bleeding.                           - Normal gastric body and antrum.                           - Normal cardia.                           - Normal examined duodenum.                           - The examined portion of the jejunum was normal.  Tattooed.                           - Successful completion of the Video Capsule                            Enteroscope placement into the stomach. Acute                            angulation of the prepyloric region into the                            duodenal bulb precluded ability to deploy video                            capsule into the small bowel.                           - No specimens collected. Recommendation:           - Return patient to hospital ward for ongoing care                           - Continue to follow serial hemoglobin hematocrit                            every 6 hours                           - Transfuse to maintain hemoglobin greater than 7                           - Follow-up video capsule result Procedure Code(s):        --- Professional ---                           [phone], Small intestinal endoscopy, enteroscopy                             beyond second portion of duodenum, not including                            ileum; diagnostic, including collection of                            specimen(s) by brushing or washing, when performed                            (separate procedure)                           44799, Unlisted procedure, small intestine Diagnosis Code(s):        --- Professional ---                           K20.80, Other esophagitis without bleeding  I86.4, Gastric varices                           D62, Acute posthemorrhagic anemia                           K92.1, Melena (includes Hematochezia)                           K92.2, Gastrointestinal hemorrhage, unspecified CPT copyright 2022 [employer]. All rights reserved. The codes documented in this report are preliminary and upon coder review may  be revised to meet current compliance requirements. Maren Beach, MD 09/21/2023 3:34:54 PM This report has been signed electronically. Number of Addenda: 0

## 2023-09-21 NOTE — Transfer of Care (Signed)
 Immediate Anesthesia Transfer of Care Note  Patient: Allen Russell  Procedure(s) Performed: ENTEROSCOPY IMAGING PROCEDURE, GI TRACT, INTRALUMINAL, VIA CAPSULE TATTOOING, USING INTRADERMAL PIGMENT  Patient Location: PACU  Anesthesia Type:MAC  Level of Consciousness: awake and sedated  Airway & Oxygen Therapy: Patient Spontanous Breathing and Patient connected to face mask oxygen  Post-op Assessment: Report given to RN and Post -op Vital signs reviewed and stable  Post vital signs: Reviewed and stable  Last Vitals:  Vitals Value Taken Time  BP 97/55 09/21/23 1531  Temp    Pulse 67 09/21/23 1533  Resp 17 09/21/23 1533  SpO2 98 % 09/21/23 1533  Vitals shown include unfiled device data.  Last Pain:  Vitals:   09/21/23 1431  TempSrc: Temporal  PainSc:       Patients Stated Pain Goal: 0 (09/19/23 0907)  Complications: No notable events documented.

## 2023-09-22 DIAGNOSIS — K922 Gastrointestinal hemorrhage, unspecified: Secondary | ICD-10-CM | POA: Diagnosis not present

## 2023-09-22 DIAGNOSIS — K921 Melena: Secondary | ICD-10-CM | POA: Diagnosis not present

## 2023-09-22 DIAGNOSIS — D649 Anemia, unspecified: Secondary | ICD-10-CM

## 2023-09-22 DIAGNOSIS — I959 Hypotension, unspecified: Secondary | ICD-10-CM | POA: Diagnosis not present

## 2023-09-22 DIAGNOSIS — D62 Acute posthemorrhagic anemia: Secondary | ICD-10-CM | POA: Diagnosis not present

## 2023-09-22 LAB — BPAM RBC
Blood Product Expiration Date: 202503252359
Blood Product Expiration Date: 202503252359
Blood Product Expiration Date: 202503292359
Blood Product Expiration Date: 202503292359
Blood Product Expiration Date: 202503292359
Blood Product Expiration Date: 202503292359
Blood Product Expiration Date: 202503292359
Blood Product Expiration Date: 202504192359
ISSUE DATE / TIME: 202503181132
ISSUE DATE / TIME: 202503181448
ISSUE DATE / TIME: 202503190821
ISSUE DATE / TIME: 202503191329
ISSUE DATE / TIME: 202503191649
ISSUE DATE / TIME: 202503200855
ISSUE DATE / TIME: 202503210759
ISSUE DATE / TIME: 202503211408
Unit Type and Rh: 600
Unit Type and Rh: 600
Unit Type and Rh: 600
Unit Type and Rh: 600
Unit Type and Rh: 600
Unit Type and Rh: 600
Unit Type and Rh: 600
Unit Type and Rh: 9500

## 2023-09-22 LAB — TYPE AND SCREEN
ABO/RH(D): A NEG
Antibody Screen: NEGATIVE
Unit division: 0
Unit division: 0
Unit division: 0
Unit division: 0
Unit division: 0
Unit division: 0
Unit division: 0
Unit division: 0

## 2023-09-22 LAB — CBC
HCT: 22.2 % — ABNORMAL LOW (ref 39.0–52.0)
HCT: 25.1 % — ABNORMAL LOW (ref 39.0–52.0)
Hemoglobin: 7.8 g/dL — ABNORMAL LOW (ref 13.0–17.0)
Hemoglobin: 8.6 g/dL — ABNORMAL LOW (ref 13.0–17.0)
MCH: 30.8 pg (ref 26.0–34.0)
MCH: 30.8 pg (ref 26.0–34.0)
MCHC: 34.3 g/dL (ref 30.0–36.0)
MCHC: 35.1 g/dL (ref 30.0–36.0)
MCV: 87.7 fL (ref 80.0–100.0)
MCV: 90 fL (ref 80.0–100.0)
Platelets: 108 10*3/uL — ABNORMAL LOW (ref 150–400)
Platelets: 143 10*3/uL — ABNORMAL LOW (ref 150–400)
RBC: 2.53 MIL/uL — ABNORMAL LOW (ref 4.22–5.81)
RBC: 2.79 MIL/uL — ABNORMAL LOW (ref 4.22–5.81)
RDW: 15.9 % — ABNORMAL HIGH (ref 11.5–15.5)
RDW: 17.2 % — ABNORMAL HIGH (ref 11.5–15.5)
WBC: 3.7 10*3/uL — ABNORMAL LOW (ref 4.0–10.5)
WBC: 3.9 10*3/uL — ABNORMAL LOW (ref 4.0–10.5)
nRBC: 0 % (ref 0.0–0.2)
nRBC: 0.5 % — ABNORMAL HIGH (ref 0.0–0.2)

## 2023-09-22 LAB — CBC WITH DIFFERENTIAL/PLATELET
Abs Immature Granulocytes: 0.02 10*3/uL (ref 0.00–0.07)
Basophils Absolute: 0 10*3/uL (ref 0.0–0.1)
Basophils Relative: 0 %
Eosinophils Absolute: 0.1 10*3/uL (ref 0.0–0.5)
Eosinophils Relative: 3 %
HCT: 24 % — ABNORMAL LOW (ref 39.0–52.0)
Hemoglobin: 8.4 g/dL — ABNORMAL LOW (ref 13.0–17.0)
Immature Granulocytes: 1 %
Lymphocytes Relative: 28 %
Lymphs Abs: 1 10*3/uL (ref 0.7–4.0)
MCH: 31 pg (ref 26.0–34.0)
MCHC: 35 g/dL (ref 30.0–36.0)
MCV: 88.6 fL (ref 80.0–100.0)
Monocytes Absolute: 0.5 10*3/uL (ref 0.1–1.0)
Monocytes Relative: 14 %
Neutro Abs: 1.8 10*3/uL (ref 1.7–7.7)
Neutrophils Relative %: 54 %
Platelets: 137 10*3/uL — ABNORMAL LOW (ref 150–400)
RBC: 2.71 MIL/uL — ABNORMAL LOW (ref 4.22–5.81)
RDW: 16.5 % — ABNORMAL HIGH (ref 11.5–15.5)
WBC: 3.4 10*3/uL — ABNORMAL LOW (ref 4.0–10.5)
nRBC: 0 % (ref 0.0–0.2)

## 2023-09-22 LAB — BASIC METABOLIC PANEL
Anion gap: 6 (ref 5–15)
BUN: 17 mg/dL (ref 6–20)
CO2: 24 mmol/L (ref 22–32)
Calcium: 7.6 mg/dL — ABNORMAL LOW (ref 8.9–10.3)
Chloride: 109 mmol/L (ref 98–111)
Creatinine, Ser: 1.72 mg/dL — ABNORMAL HIGH (ref 0.61–1.24)
GFR, Estimated: 45 mL/min — ABNORMAL LOW (ref >60)
Glucose, Bld: 187 mg/dL — ABNORMAL HIGH (ref 70–99)
Potassium: 3.5 mmol/L (ref 3.5–5.1)
Sodium: 139 mmol/L (ref 135–145)

## 2023-09-22 LAB — HEMOGLOBIN AND HEMATOCRIT, BLOOD
HCT: 26.7 % — ABNORMAL LOW (ref 39.0–52.0)
Hemoglobin: 9.2 g/dL — ABNORMAL LOW (ref 13.0–17.0)

## 2023-09-22 LAB — CULTURE, BLOOD (ROUTINE X 2)
Culture: NO GROWTH
Culture: NO GROWTH

## 2023-09-22 LAB — GLUCOSE, CAPILLARY
Glucose-Capillary: 156 mg/dL — ABNORMAL HIGH (ref 70–99)
Glucose-Capillary: 175 mg/dL — ABNORMAL HIGH (ref 70–99)
Glucose-Capillary: 72 mg/dL (ref 70–99)
Glucose-Capillary: 248 mg/dL — ABNORMAL HIGH (ref 70–99)

## 2023-09-22 LAB — MAGNESIUM: Magnesium: 1.9 mg/dL (ref 1.7–2.4)

## 2023-09-22 MED ORDER — BUTALBITAL-APAP-CAFFEINE 50-325-40 MG PO TABS
1.0000 | ORAL_TABLET | Freq: Four times a day (QID) | ORAL | Status: DC | PRN
  Administered 2023-09-22 – 2023-09-23 (×2): 1 via ORAL
  Filled 2023-09-22 (×2): qty 1

## 2023-09-22 MED ORDER — MIDODRINE HCL 5 MG PO TABS
5.0000 mg | ORAL_TABLET | Freq: Three times a day (TID) | ORAL | Status: DC
  Administered 2023-09-22 – 2023-09-23 (×5): 5 mg via ORAL
  Filled 2023-09-22 (×5): qty 1

## 2023-09-22 MED ORDER — SODIUM CHLORIDE 0.9 % IV SOLN
25.0000 mg | Freq: Four times a day (QID) | INTRAVENOUS | Status: DC | PRN
Start: 2023-09-22 — End: 2023-09-24
  Administered 2023-09-23: 25 mg via INTRAVENOUS
  Filled 2023-09-22: qty 25

## 2023-09-22 NOTE — Plan of Care (Signed)

## 2023-09-22 NOTE — Plan of Care (Signed)
 Pt has rested quietly throughout the night with no distress noted. Alert and oriented. ON room air. SR on the monitor. Up to BR to void. Wore GI camera till 0400. Ate right before midnight. NO N/V. Medicated twice for pain with relief noted. No other complaints voiced.     Problem: Health Behavior/Discharge Planning: Goal: Ability to manage health-related needs will improve Outcome: Progressing   Problem: Tissue Perfusion: Goal: Adequacy of tissue perfusion will improve Outcome: Progressing   Problem: Education: Goal: Knowledge of General Education information will improve Description: Including pain rating scale, medication(s)/side effects and non-pharmacologic comfort measures Outcome: Progressing   Problem: Health Behavior/Discharge Planning: Goal: Ability to manage health-related needs will improve Outcome: Progressing   Problem: Clinical Measurements: Goal: Ability to maintain clinical measurements within normal limits will improve Outcome: Progressing   Problem: Pain Managment: Goal: General experience of comfort will improve and/or be controlled Outcome: Progressing

## 2023-09-22 NOTE — Progress Notes (Signed)
 [facility] ROUNDING NOTE   Subjective: Patient reports headache the past 2 days, not responding to Tylenol.  Passed a bowel movement last night which was normal dark.  Patient's hemoglobin 7.8 at midnight last night, 9.2 at 830 this morning.  Does not appear he had a transfusion.  Video capsule reviewed this afternoon.  There was no evidence of bleeding or recent blood loss, other than a few small flecks of blood in the duodenum.  No AVMs were seen throughout the small intestine.  In the distal ileum, there appeared to be a diverticulum.  There was some stool/food residue within the diverticulum, but no bleeding stigmata.   Objective: Vital signs in last 24 hours: Temp:  [97.6 F (36.4 C)-98.1 F (36.7 C)] 97.8 F (36.6 C) (03/22 0750) Pulse Rate:  [59-70] 65 (03/22 0011) Resp:  [3-18] 16 (03/22 0011) BP: (93-120)/(46-69) 93/54 (03/22 0750) SpO2:  [98 %-100 %] 99 % (03/22 0011) Weight:  [95.9 kg-97.8 kg] 95.9 kg (03/22 0500) Last BM Date : 09/20/23 General: NAD, pleasant Caucasian male lying in bed Lungs:  CTA b/l, no w/r/r Heart:  RRR, no m/r/g Abdomen:  Soft, NT, ND, +BS Ext:  No c/c/e    Intake/Output from previous day: 03/21 0701 - 03/22 0700 In: 1060 [P.O.:480; I.V.:250; Blood:330] Out: -  Intake/Output this shift: No intake/output data recorded.   Lab Results: Recent Labs    09/21/23 0445 09/21/23 1254 09/21/23 1636 09/22/23 0004 09/22/23 0820  WBC 3.8*  --   --  3.7*  --   HGB 6.6*   < > 9.7* 7.8* 9.2*  PLT 112*  --   --  108*  --   MCV 85.7  --   --  87.7  --    < > = values in this interval not displayed.   BMET Recent Labs    09/19/23 2319 09/21/23 0445 09/22/23 0004  NA 137 139 139  K 3.8 3.2* 3.5  CL 109 110 109  CO2 22 24 24   GLUCOSE 186* 87 187*  BUN 56* 29* 17  CREATININE 1.74* 1.70* 1.72*  CALCIUM 7.3* 7.2* 7.6*   LFT No results for input(s): "PROT", "ALBUMIN", "AST", "ALT", "ALKPHOS", "BILITOT", "BILIDIR", "IBILI" in  the last 72 hours. PT/INR No results for input(s): "INR" in the last 72 hours.    Imaging/Other results: CT ANGIO GI BLEED Result Date: 09/20/2023 CLINICAL DATA:  GI bleed EXAM: CTA ABDOMEN AND PELVIS WITHOUT AND WITH CONTRAST TECHNIQUE: Multidetector CT imaging of the abdomen and pelvis was performed using the standard protocol during bolus administration of intravenous contrast. Multiplanar reconstructed images and MIPs were obtained and reviewed to evaluate the vascular anatomy. RADIATION DOSE REDUCTION: This exam was performed according to the departmental dose-optimization program which includes automated exposure control, adjustment of the mA and/or kV according to patient size and/or use of iterative reconstruction technique. CONTRAST:  OMNIPAQUE IOHEXOL 350 MG/ML SOLN COMPARISON:  CT chest abdomen pelvis, 12/07/2002 FINDINGS: VASCULAR Normal contour and caliber of the abdominal aorta. No evidence of aneurysm, dissection, or other acute aortic pathology. Standard branching pattern of the abdominal aorta with solitary bilateral renal arteries. Mild aortic atherosclerosis. Bili calcinosis. Review of the MIP images confirms the above findings. NON-VASCULAR Lower Chest: No acute findings. Hepatobiliary: No focal liver abnormality is seen. Status post cholecystectomy. Unchanged postoperative biliary ductal dilatation. Pancreas: Unchanged, severe stigmata of chronic pancreatitis, including coarse calcifications throughout the atrophic pancreatic parenchyma as well as a very scarred and tethered appearance of tissue  about the pancreatic body and tail, encases the celiac vessels and effaces the splenic vein. Extensive collateralization about the left upper quadrant, including large gastroesophageal and mesenteric varices Spleen: Normal in size without significant abnormality. Adrenals/Urinary Tract: Adrenal glands are unremarkable. Kidneys are normal, without renal calculi, solid lesion, or  hydronephrosis. Bladder is unremarkable. Stomach/Bowel: Stomach is within normal limits. Appendix appears normal. No evidence of bowel wall thickening, distention, or inflammatory changes. No intraluminal contrast extravasation or other findings to specifically localize reported GI bleeding. Lymphatic: No enlarged abdominal or pelvic lymph nodes. Reproductive: No mass or other significant abnormality. Other: No abdominal wall hernia or abnormality. No ascites. Musculoskeletal: No acute osseous findings. IMPRESSION: 1. No evidence of intraluminal contrast extravasation or other findings to specifically localize reported GI bleeding. 2. Unchanged, severe stigmata of chronic pancreatitis, including effacement of the splenic vein with large gastroesophageal and mesenteric varices. Aortic Atherosclerosis (ICD10-I70.0). Electronically Signed   By: Jearld Lesch M.D.   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   Definitive bleeding source not yet identified.  Although EGD showed LA grade C esophagitis and nonbleeding gastric varices, there were no bleeding stigmata on the varices, and it was not felt to be the source of bleeding.  Subsequent CTA as well as push enteroscopy also negative for bleeding source.  Video capsule endoscopy today did not show any evidence of blood or active bleeding in the small intestine, but did show what was suspected to be a diverticulum in the distal ileum.  Unclear if this would represent a Meckel's diverticulum versus a traditional diverticulum, but this could be a potential source of brisk GI bleeding.  I recommended we proceed with a Meckel's scan  to rule out a Meckel's diverticulum as a source of obscure GI bleed.  Obscure GI bleed - Meckel's scan today (if possible) -Continue to trend hemoglobin every 8 hours - Continue Protonix twice daily for now - Continue regular diet  Jenel Lucks, MD  09/22/2023, 1:18 PM [facility]

## 2023-09-22 NOTE — Progress Notes (Signed)
 PROGRESS NOTE    Allen Russell  WUJ:[id number] DOB: 03/11/1961 DOA: 09/17/2023 PCP: [name], PA-C    Brief Narrative:    63 y.o. 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   Upon admission endoscopy showed erosive esophagitis but no active bleeding.  Continued to have melanotic stool with drop in hemoglobin requiring multiple transfusion therefore obtain CTA which was negative.  GI planning on EGD with enteroscopy today.  Assessment & Plan:    Acute blood loss anemia secondary erosive esophagitis, melanotic stools.  With hypotension and syncope   - Concerns for upper GI bleed with hemoglobin of 5.4.  Endoscopy showed erosive esophagitis but no active bleeding.  Continues to drop his hemoglobin requiring multiple units of PRBC.  CTA negative for any active bleeding.  Underwent EGD with enteroscopy showing LA Grade C erosive esophagitis with no bleeding. Gastric varices, without bleeding. He also had completion of the Video Capsule Enteroscope placement into the stomach. Acute angulation of the prepyloric region into the duodenal bulb precluded ability to deploy Video capsule into the small bowel.  Await video capsule enteroscopy results, continue to monitor CBC, defer further management to GI.  Blood pressure improved, advance activity and monitor.    Hypokalemia  - Aggressive repletion   CKD stage IIIa  In the past 2 years creatinine has been around 2.2.  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 No new focal weakness.  Vitals:   09/21/23 2102 09/22/23 0011 09/22/23 0500 09/22/23 0750  BP:  (!) 94/46  (!) 93/54  Pulse:  65    Temp: 97.7 F (36.5 C) 97.9 F (36.6 C)  97.8 F (36.6 C)  Resp:  16    Height:      Weight:   95.9 kg   SpO2:  99%    TempSrc: Oral Oral  Oral  BMI (Calculated):   25.75     Examination:  Awake Alert, No new F.N deficits, Normal affect [place].AT,PERRAL Supple Neck, No JVD,   Symmetrical Chest wall movement, Good air movement bilaterally, CTAB RRR,No Gallops, Rubs or new Murmurs,  +ve B.Sounds, Abd Soft, No tenderness,   No Cyanosis, Clubbing or edema     Diet Orders (From admission, onward)     Start     Ordered   09/21/23 1959  Diet Heart Room service appropriate? Yes; Fluid consistency: Thin  Diet effective now       Question Answer Comment  Room service appropriate? Yes   Fluid consistency: Thin      09/21/23 1958             Data  Review:   Inpatient Medications  Scheduled Meds:  insulin aspart  0-20 Units Subcutaneous TID WC   insulin aspart  0-5 Units Subcutaneous QHS   insulin glargine  12 Units Subcutaneous Q2200   lipase/protease/amylase  72,000 Units Oral TID WC   midodrine  5 mg Oral TID WC   pantoprazole (PROTONIX) IV  40 mg Intravenous Q12H   pneumococcal 20-valent conjugate vaccine  0.5 mL Intramuscular Tomorrow-1000   scopolamine  1 patch Transdermal Q72H   sodium chloride flush  3 mL Intravenous Q12H   Continuous Infusions:  iron  sucrose 200 mg (09/21/23 1634)   PRN Meds:.acetaminophen **OR** acetaminophen, guaiFENesin, hydrALAZINE, ipratropium-albuterol, metoprolol tartrate, [DISCONTINUED] ondansetron **OR** ondansetron (ZOFRAN) IV, phenol, senna-docusate, traZODone  DVT Prophylaxis  Place and maintain sequential compression device Start: 09/22/23 0840 Place and maintain sequential compression device Start: 09/18/23 0932  Recent Labs  Lab 09/17/23 1715 09/18/23 0617 09/18/23 1943 09/19/23 1142 09/20/23 2359 09/21/23 0445 09/21/23 1254 09/21/23 1636 09/22/23 0004  WBC 10.4 5.7 8.6  --   --  3.8*  --   --  3.7*  HGB 8.7* 5.4* 6.5*   < > 6.6* 6.6* 6.9* 9.7* 7.8*  HCT 25.1* 16.2* 18.5*   < > 18.4* 18.6* 19.6* 27.9* 22.2*  PLT 218 153 174  --   --  112*  --   --  108*  MCV 89.3 90.5 86.9  --   --  85.7  --   --  87.7  MCH 31.0 30.2 30.5  --   --  30.4  --   --  30.8  MCHC 34.7 33.3 35.1  --   --  35.5  --   --  35.1  RDW 13.3 13.6 13.9  --   --  15.9*  --   --  15.9*  LYMPHSABS 0.9  --  2.9  --   --   --   --   --   --   MONOABS 0.8  --  0.6  --   --   --   --   --   --   EOSABS 0.0  --  0.0  --   --   --   --   --   --   BASOSABS 0.0  --  0.2*  --   --   --   --   --   --    < > = values in this interval not displayed.    Recent Labs  Lab 09/17/23 1715 09/17/23 1722 09/17/23 2051 09/18/23 0507 09/19/23 2319 09/21/23 0445 09/22/23 0004  NA 133*  --   --  135 137 139 139  K 5.8*  --   --  4.2 3.8 3.2* 3.5  CL 101  --   --  109 109 110 109  CO2 21*  --   --  20* 22 24 24   ANIONGAP 11  --   --  6 6 5 6   GLUCOSE 219*  --   --  256* 186* 87 187*  BUN 67*  --   --  65* 56* 29* 17  CREATININE 1.92*  --   --  1.79* 1.74* 1.70* 1.72*  AST 31  --   --   --   --   --   --   ALT 34  --   --   --   --   --   --   ALKPHOS 45  --   --   --   --   --   --  BILITOT 0.5  --   --   --   --   --   --   ALBUMIN 3.0*  --   --   --   --   --   --   LATICACIDVEN  --  2.4* 1.5  --   --   --   --   TSH 1.809  --    --   --   --   --   --   HGBA1C  --   --   --  7.2*  --   --   --   MG  --   --   --   --  1.6* 1.9 1.9  CALCIUM 8.4*  --   --  7.5* 7.3* 7.2* 7.6*      Recent Labs  Lab 09/17/23 1715 09/17/23 1722 09/17/23 2051 09/18/23 0507 09/19/23 2319 09/21/23 0445 09/22/23 0004  LATICACIDVEN  --  2.4* 1.5  --   --   --   --   TSH 1.809  --   --   --   --   --   --   HGBA1C  --   --   --  7.2*  --   --   --   MG  --   --   --   --  1.6* 1.9 1.9  CALCIUM 8.4*  --   --  7.5* 7.3* 7.2* 7.6*      Radiology Reports  CT ANGIO GI BLEED Result Date: 09/20/2023 CLINICAL DATA:  GI bleed EXAM: CTA ABDOMEN AND PELVIS WITHOUT AND WITH CONTRAST TECHNIQUE: Multidetector CT imaging of the abdomen and pelvis was performed using the standard protocol during bolus administration of intravenous contrast. Multiplanar reconstructed images and MIPs were obtained and reviewed to evaluate the vascular anatomy. RADIATION DOSE REDUCTION: This exam was performed according to the departmental dose-optimization program which includes automated exposure control, adjustment of the mA and/or kV according to patient size and/or use of iterative reconstruction technique. CONTRAST:  OMNIPAQUE IOHEXOL 350 MG/ML SOLN COMPARISON:  CT chest abdomen pelvis, 12/07/2002 FINDINGS: VASCULAR Normal contour and caliber of the abdominal aorta. No evidence of aneurysm, dissection, or other acute aortic pathology. Standard branching pattern of the abdominal aorta with solitary bilateral renal arteries. Mild aortic atherosclerosis. Bili calcinosis. Review of the MIP images confirms the above findings. NON-VASCULAR Lower Chest: No acute findings. Hepatobiliary: No focal liver abnormality is seen. Status post cholecystectomy. Unchanged postoperative biliary ductal dilatation. Pancreas: Unchanged, severe stigmata of chronic pancreatitis, including coarse calcifications throughout the atrophic pancreatic parenchyma as well as a very scarred and  tethered appearance of tissue about the pancreatic body and tail, encases the celiac vessels and effaces the splenic vein. Extensive collateralization about the left upper quadrant, including large gastroesophageal and mesenteric varices Spleen: Normal in size without significant abnormality. Adrenals/Urinary Tract: Adrenal glands are unremarkable. Kidneys are normal, without renal calculi, solid lesion, or hydronephrosis. Bladder is unremarkable. Stomach/Bowel: Stomach is within normal limits. Appendix appears normal. No evidence of bowel wall thickening, distention, or inflammatory changes. No intraluminal contrast extravasation or other findings to specifically localize reported GI bleeding. Lymphatic: No enlarged abdominal or pelvic lymph nodes. Reproductive: No mass or other significant abnormality. Other: No abdominal wall hernia or abnormality. No ascites. Musculoskeletal: No acute osseous findings. IMPRESSION: 1. No evidence of intraluminal contrast extravasation or other findings to specifically localize reported GI bleeding. 2. Unchanged, severe stigmata of chronic pancreatitis, including effacement of the splenic vein with  large gastroesophageal and mesenteric varices. Aortic Atherosclerosis (ICD10-I70.0). Electronically Signed   By: Jearld Lesch M.D.   On: 09/20/2023 19:03      Signature  -   Susa Raring M.D on 09/22/2023 at 8:44 AM   -  To page go to [email or web address]

## 2023-09-23 ENCOUNTER — Encounter (HOSPITAL_COMMUNITY): Payer: Self-pay | Admitting: Pediatrics

## 2023-09-23 DIAGNOSIS — K219 Gastro-esophageal reflux disease without esophagitis: Secondary | ICD-10-CM | POA: Diagnosis not present

## 2023-09-23 DIAGNOSIS — D62 Acute posthemorrhagic anemia: Secondary | ICD-10-CM | POA: Diagnosis not present

## 2023-09-23 DIAGNOSIS — K922 Gastrointestinal hemorrhage, unspecified: Secondary | ICD-10-CM | POA: Diagnosis not present

## 2023-09-23 DIAGNOSIS — K2100 Gastro-esophageal reflux disease with esophagitis, without bleeding: Secondary | ICD-10-CM

## 2023-09-23 DIAGNOSIS — I864 Gastric varices: Secondary | ICD-10-CM

## 2023-09-23 DIAGNOSIS — K921 Melena: Secondary | ICD-10-CM | POA: Diagnosis not present

## 2023-09-23 DIAGNOSIS — Z860101 Personal history of adenomatous and serrated colon polyps: Secondary | ICD-10-CM

## 2023-09-23 LAB — BASIC METABOLIC PANEL
Anion gap: 6 (ref 5–15)
BUN: 16 mg/dL (ref 6–20)
CO2: 26 mmol/L (ref 22–32)
Calcium: 8 mg/dL — ABNORMAL LOW (ref 8.9–10.3)
Chloride: 109 mmol/L (ref 98–111)
Creatinine, Ser: 1.71 mg/dL — ABNORMAL HIGH (ref 0.61–1.24)
GFR, Estimated: 45 mL/min — ABNORMAL LOW (ref >60)
Glucose, Bld: 123 mg/dL — ABNORMAL HIGH (ref 70–99)
Potassium: 3.6 mmol/L (ref 3.5–5.1)
Sodium: 141 mmol/L (ref 135–145)

## 2023-09-23 LAB — GLUCOSE, CAPILLARY
Glucose-Capillary: 167 mg/dL — ABNORMAL HIGH (ref 70–99)
Glucose-Capillary: 170 mg/dL — ABNORMAL HIGH (ref 70–99)
Glucose-Capillary: 114 mg/dL — ABNORMAL HIGH (ref 70–99)
Glucose-Capillary: 142 mg/dL — ABNORMAL HIGH (ref 70–99)

## 2023-09-23 LAB — HEMOGLOBIN AND HEMATOCRIT, BLOOD
HCT: 24.4 % — ABNORMAL LOW (ref 39.0–52.0)
Hemoglobin: 8.4 g/dL — ABNORMAL LOW (ref 13.0–17.0)

## 2023-09-23 LAB — CBC
HCT: 26.5 % — ABNORMAL LOW (ref 39.0–52.0)
Hemoglobin: 9.1 g/dL — ABNORMAL LOW (ref 13.0–17.0)
MCH: 31 pg (ref 26.0–34.0)
MCHC: 34.3 g/dL (ref 30.0–36.0)
MCV: 90.1 fL (ref 80.0–100.0)
Platelets: 162 10*3/uL (ref 150–400)
RBC: 2.94 MIL/uL — ABNORMAL LOW (ref 4.22–5.81)
RDW: 17 % — ABNORMAL HIGH (ref 11.5–15.5)
WBC: 4 10*3/uL (ref 4.0–10.5)
nRBC: 0 % (ref 0.0–0.2)

## 2023-09-23 LAB — MAGNESIUM: Magnesium: 2 mg/dL (ref 1.7–2.4)

## 2023-09-23 NOTE — Progress Notes (Signed)
 Daily Progress Note  DOA: 09/17/2023 Hospital Day: 7   Chief Complaint: GI bleed with melena   ASSESSMENT    63 yo male with obscure GI bleed with melena / hematin in stomach on EGD.  Initially there was concern for erosion of a pancreatic pseudoaneurysm but CT angio was unrevealing. Hematin in stomach on EGD which also showed LA grade C esophagitis and nonbleeding gastric varices but neither which were felt to be the cause of bleeding.  Video capsule study yesterday did not show any evidence of blood or active bleeding in the small intestine, but did show what was suspected to be a diverticulum in the distal ileum. Possibly Meckel's diverticulum which can bleed  Acute blood loss anemia.  Hgb 13.4 in December, down to 8.7 this admission with further decline to 5.4 ( after IVF). Has hasn't had a great response to multiple u RBCs this admission but hgb is stable at 9.1   History of colon polyps.  4 subcentimeter polyps including a TVA in Aug 2023.    Chronic pancreatitis History of necrotizing pancreatitis requiring surgical necrosectomy and cystogastrostomy in 2020. Recurrent pancreatitis with pseudocysts in 2019 /  chronic calcific pancreatitis with pancreatic insufficiency.     GERD, takes Tums or acid reducer as needed   History of secondary adrenal insufficiency, not on steroids   CKD 3 a / Type 2 diabetes. See PMH for any additional medical history    PLAN   --Would like to get a Meckel's scan but unable to reach Nuclear Medicine to discuss safety of any study contrast in setting of CKD.  --Continue BID Protonix for now.  --Monitor H/H  Interim History / Subjective   Had a small dark stool with red blood yesterday and another small dark stool with blood this am. Ambulating in hall   Objective   EGD 09/17/23 - Normal upper third of esophagus and middle third of esophagus.  - LA Grade C erosive esophagitis with no bleeding. --Hematin (altered  blood/coffee-ground-like material) in the entire stomach.  - Normal duodenal bulb and second portion of the duodenum.  - No specimens collected.  - EGD shows evidence of previous bleeding in the upper GI tract based upon scattered hematin throughout the stomach. The only pathologic finding identified was grade C esophagitis which could have contributed to bleeding although would be less likely to cause hypotension. A Dieulafoy lesion is another consideration which could cause intermittent bleeding leading to an absence of significant findings on EGD today.   Recent Labs    09/22/23 1539 09/22/23 2106 09/22/23 2347 09/23/23 0555  WBC 3.4* 3.9*  --  4.0  HGB 8.4* 8.6* 8.4* 9.1*  HCT 24.0* 25.1* 24.4* 26.5*  MCV 88.6 90.0  --  90.1  PLT 137* 143*  --  162   No results for input(s): "FOLATE", "VITAMINB12", "FERRITIN", "TIBC", "IRONPCTSAT" in the last 72 hours. Recent Labs    09/21/23 0445 09/22/23 0004 09/23/23 0555  NA 139 139 141  K 3.2* 3.5 3.6  CL 110 109 109  CO2 24 24 26   GLUCOSE 87 187* 123*  BUN 29* 17 16  CREATININE 1.70* 1.72* 1.71*  CALCIUM 7.2* 7.6* 8.0*   No results for input(s): "PROT", "ALBUMIN", "AST", "ALT", "ALKPHOS", "BILITOT", "BILIDIR", "IBILI" in the last 72 hours. No results for input(s): "INR" in the last 72 hours. No results for input(s): "AFPTUMOR" in the last 72 hours.   No results for input(s): "ANA" in the last 72  hours.  Imaging:  CT ANGIO GI BLEED CLINICAL DATA:  GI bleed  EXAM: CTA ABDOMEN AND PELVIS WITHOUT AND WITH CONTRAST  TECHNIQUE: Multidetector CT imaging of the abdomen and pelvis was performed using the standard protocol during bolus administration of intravenous contrast. Multiplanar reconstructed images and MIPs were obtained and reviewed to evaluate the vascular anatomy.  RADIATION DOSE REDUCTION: This exam was performed according to the departmental dose-optimization program which includes automated exposure control,  adjustment of the mA and/or kV according to patient size and/or use of iterative reconstruction technique.  CONTRAST:  OMNIPAQUE IOHEXOL 350 MG/ML SOLN  COMPARISON:  CT chest abdomen pelvis, 12/07/2002  FINDINGS: VASCULAR  Normal contour and caliber of the abdominal aorta. No evidence of aneurysm, dissection, or other acute aortic pathology. Standard branching pattern of the abdominal aorta with solitary bilateral renal arteries. Mild aortic atherosclerosis. Bili calcinosis.  Review of the MIP images confirms the above findings.  NON-VASCULAR  Lower Chest: No acute findings.  Hepatobiliary: No focal liver abnormality is seen. Status post cholecystectomy. Unchanged postoperative biliary ductal dilatation.  Pancreas: Unchanged, severe stigmata of chronic pancreatitis, including coarse calcifications throughout the atrophic pancreatic parenchyma as well as a very scarred and tethered appearance of tissue about the pancreatic body and tail, encases the celiac vessels and effaces the splenic vein. Extensive collateralization about the left upper quadrant, including large gastroesophageal and mesenteric varices  Spleen: Normal in size without significant abnormality.  Adrenals/Urinary Tract: Adrenal glands are unremarkable. Kidneys are normal, without renal calculi, solid lesion, or hydronephrosis. Bladder is unremarkable.  Stomach/Bowel: Stomach is within normal limits. Appendix appears normal. No evidence of bowel wall thickening, distention, or inflammatory changes. No intraluminal contrast extravasation or other findings to specifically localize reported GI bleeding.  Lymphatic: No enlarged abdominal or pelvic lymph nodes.  Reproductive: No mass or other significant abnormality.  Other: No abdominal wall hernia or abnormality. No ascites.  Musculoskeletal: No acute osseous findings.  IMPRESSION: 1. No evidence of intraluminal contrast extravasation or  other findings to specifically localize reported GI bleeding. 2. Unchanged, severe stigmata of chronic pancreatitis, including effacement of the splenic vein with large gastroesophageal and mesenteric varices.  Aortic Atherosclerosis (ICD10-I70.0).  Electronically Signed   By: Jearld Lesch M.D.   On: 09/20/2023 19:03     Scheduled inpatient medications:   insulin aspart  0-20 Units Subcutaneous TID WC   insulin aspart  0-5 Units Subcutaneous QHS   insulin glargine  12 Units Subcutaneous Q2200   lipase/protease/amylase  72,000 Units Oral TID WC   midodrine  5 mg Oral TID WC   pantoprazole (PROTONIX) IV  40 mg Intravenous Q12H   pneumococcal 20-valent conjugate vaccine  0.5 mL Intramuscular Tomorrow-1000   scopolamine  1 patch Transdermal Q72H   sodium chloride flush  3 mL Intravenous Q12H   Continuous inpatient infusions:   promethazine (PHENERGAN) injection (IM or IVPB) 25 mg (09/23/23 0114)   PRN inpatient medications: acetaminophen **OR** acetaminophen, butalbital-acetaminophen-caffeine, guaiFENesin, hydrALAZINE, ipratropium-albuterol, metoprolol tartrate, [DISCONTINUED] ondansetron **OR** ondansetron (ZOFRAN) IV, phenol, promethazine (PHENERGAN) injection (IM or IVPB), senna-docusate, traZODone  Vital signs in last 24 hours: Temp:  [97.9 F (36.6 C)-98.4 F (36.9 C)] 98.1 F (36.7 C) (03/23 0848) Pulse Rate:  [60-63] 60 (03/23 0324) Resp:  [16-19] 18 (03/23 0848) BP: (100-119)/(54-62) 119/62 (03/23 0321) SpO2:  [98 %-100 %] 98 % (03/23 0848) Weight:  [94 kg] 94 kg (03/23 0324) Last BM Date : 09/22/23  Intake/Output Summary (Last 24 hours) at 09/23/2023 1610  Last data filed at 09/23/2023 0300 Gross per 24 hour  Intake 1258 ml  Output 1800 ml  Net -542 ml    Intake/Output from previous day: 03/22 0701 - 03/23 0700 In: 1258 [P.O.:1208; IV Piggyback:50] Out: 1800 [Urine:1800] Intake/Output this shift: No intake/output data recorded.   Physical Exam:  General:  Alert male in NAD Heart:  Regular rate and rhythm.  Pulmonary: Normal respiratory effort Abdomen: Soft, mild generalized tenderness.  Normal bowel sounds. Extremities: No lower extremity edema  Neurologic: Alert and oriented Psych: Pleasant. Cooperative     LOS: 6 days   Willette Cluster ,NP 09/23/2023, 9:55 AM

## 2023-09-23 NOTE — Progress Notes (Signed)
 PROGRESS NOTE    Allen Russell  RUE:[id number] DOB: Sep 12, 1960 DOA: 09/17/2023 PCP: [name], PA-C    Brief Narrative:    63 y.o. 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   Upon admission endoscopy showed erosive esophagitis but no active bleeding.  Continued to have melanotic stool with drop in hemoglobin requiring multiple transfusion therefore obtain CTA which was negative.  In by GI underwent EGD and capsule enteroscopy.  No active bleeding.  Assessment & Plan:    Acute blood loss anemia secondary erosive esophagitis, melanotic stools.  With hypotension and syncope   - Concerns for upper GI bleed with hemoglobin of 5.4.  Endoscopy showed erosive esophagitis but no active bleeding.  Continues to drop his hemoglobin requiring multiple units of PRBC.  CTA negative for any active bleeding.    Underwent EGD with enteroscopy showing LA Grade C erosive esophagitis with no bleeding. Gastric varices, without bleeding. He also had completion of the Video Capsule Enteroscope placement into the stomach. Acute angulation of the prepyloric region into the duodenal bulb precluded ability to deploy Video capsule into the small bowel.  Video capsule enteroscopy - no evidence of bleeding or recent blood loss, other than a few small flecks of blood in the duodenum. No AVMs were seen throughout the small intestine. In the distal ileum, there appeared to be a diverticulum. There was some stool/food residue within the diverticulum, but no bleeding stigmata.    CBC seems to have stabilized, blood pressure improved, advance activity and  monitor.  Stable likely discharge home on 09/24/2023.    Hypokalemia  - Aggressive repletion   CKD stage IIIa  In the past 2 years creatinine has been around 2.2.  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 No focal weakness.  Vitals:   09/22/23 2354 09/23/23 0321 09/23/23 0324 09/23/23 0327  BP: 104/60 119/62    Pulse: 60  60   Temp: 98 F (36.7 C)   97.9 F (36.6 C)  Resp: 18  16   Height:      Weight:   94 kg   SpO2: 100%  98%   TempSrc: Oral   Oral  BMI (Calculated):   25.24     Examination:  Awake Alert, No new F.N deficits, Normal affect [place].AT,PERRAL Supple Neck, No JVD,   Symmetrical Chest wall movement, Good air movement bilaterally, CTAB RRR,No Gallops, Rubs or new Murmurs,  +ve B.Sounds, Abd Soft, No tenderness,   No Cyanosis, Clubbing or edema     Diet Orders (From admission, onward)     Start     Ordered   09/21/23 1959  Diet Heart Room service appropriate?  Yes; Fluid consistency: Thin  Diet effective now       Question Answer Comment  Room service appropriate? Yes   Fluid consistency: Thin      09/21/23 1958             Data Review:   Inpatient Medications  Scheduled Meds:  insulin aspart  0-20 Units Subcutaneous TID WC   insulin aspart  0-5 Units Subcutaneous QHS   insulin glargine  12 Units Subcutaneous Q2200   lipase/protease/amylase  72,000 Units Oral TID WC    midodrine  5 mg Oral TID WC   pantoprazole (PROTONIX) IV  40 mg Intravenous Q12H   pneumococcal 20-valent conjugate vaccine  0.5 mL Intramuscular Tomorrow-1000   scopolamine  1 patch Transdermal Q72H   sodium chloride flush  3 mL Intravenous Q12H   Continuous Infusions:  promethazine (PHENERGAN) injection (IM or IVPB) 25 mg (09/23/23 0114)   PRN Meds:.acetaminophen **OR** acetaminophen, butalbital-acetaminophen-caffeine, guaiFENesin, hydrALAZINE, ipratropium-albuterol, metoprolol tartrate, [DISCONTINUED] ondansetron **OR** ondansetron (ZOFRAN) IV, phenol, promethazine (PHENERGAN) injection (IM or IVPB), senna-docusate, traZODone  DVT Prophylaxis  Place and maintain sequential compression device Start: 09/22/23 0840 Place and maintain sequential compression device Start: 09/18/23 0932  Recent Labs  Lab 09/17/23 1715 09/18/23 0617 09/18/23 1943 09/19/23 1142 09/21/23 0445 09/21/23 1254 09/22/23 0004 09/22/23 0820 09/22/23 1539 09/22/23 2106 09/22/23 2347 09/23/23 0555  WBC 10.4   < > 8.6  --  3.8*  --  3.7*  --  3.4* 3.9*  --  4.0  HGB 8.7*   < > 6.5*   < > 6.6*   < > 7.8* 9.2* 8.4* 8.6* 8.4* 9.1*  HCT 25.1*   < > 18.5*   < > 18.6*   < > 22.2* 26.7* 24.0* 25.1* 24.4* 26.5*  PLT 218   < > 174  --  112*  --  108*  --  137* 143*  --  162  MCV 89.3   < > 86.9  --  85.7  --  87.7  --  88.6 90.0  --  90.1  MCH 31.0   < > 30.5  --  30.4  --  30.8  --  31.0 30.8  --  31.0  MCHC 34.7   < > 35.1  --  35.5  --  35.1  --  35.0 34.3  --  34.3  RDW 13.3   < > 13.9  --  15.9*  --  15.9*  --  16.5* 17.2*  --  17.0*  LYMPHSABS 0.9  --  2.9  --   --   --   --   --  1.0  --   --   --   MONOABS 0.8  --  0.6  --   --   --   --   --  0.5  --   --   --   EOSABS 0.0  --  0.0  --   --   --   --   --  0.1  --   --   --   BASOSABS 0.0  --  0.2*  --   --   --   --   --  0.0  --   --   --    < > = values in this interval not displayed.    Recent Labs  Lab 09/17/23 1715 09/17/23 1722 09/17/23 2051  09/18/23 0507 09/19/23 2319 09/21/23 0445 09/22/23 0004 09/23/23 0555  NA 133*  --   --  135 137 139 139 141  K 5.8*  --   --  4.2 3.8 3.2* 3.5 3.6  CL 101  --   --  109 109 110 109 109  CO2 21*  --   --  20* 22 24 24 26   ANIONGAP 11  --   --  6 6 5 6 6   GLUCOSE 219*  --   --  256* 186* 87 187* 123*  BUN 67*  --   --  65* 56* 29* 17 16  CREATININE 1.92*  --   --  1.79* 1.74* 1.70* 1.72* 1.71*  AST 31  --   --   --   --   --   --   --   ALT 34  --   --   --   --   --   --   --   ALKPHOS 45  --   --   --   --   --   --   --   BILITOT 0.5  --   --   --   --   --   --   --   ALBUMIN 3.0*  --   --   --   --   --   --   --   LATICACIDVEN  --  2.4* 1.5  --   --   --   --   --   TSH 1.809  --   --   --   --   --   --   --   HGBA1C  --   --   --  7.2*  --   --   --   --   MG  --   --   --   --  1.6* 1.9 1.9 2.0  CALCIUM 8.4*  --   --  7.5* 7.3* 7.2* 7.6* 8.0*      Recent Labs  Lab 09/17/23 1715 09/17/23 1722 09/17/23 2051 09/18/23 0507 09/19/23 2319 09/21/23 0445 09/22/23 0004 09/23/23 0555  LATICACIDVEN  --  2.4* 1.5  --   --   --   --   --   TSH 1.809  --   --   --   --   --   --   --   HGBA1C  --   --   --  7.2*  --   --   --   --   MG  --   --   --   --  1.6* 1.9 1.9 2.0  CALCIUM 8.4*  --   --  7.5* 7.3* 7.2* 7.6* 8.0*      Radiology Reports  No results found.     Signature  -   Susa Raring M.D on 09/23/2023 at 7:48 AM   -  To page go to [email or web address]

## 2023-09-23 NOTE — Plan of Care (Signed)

## 2023-09-23 NOTE — Plan of Care (Signed)

## 2023-09-24 ENCOUNTER — Other Ambulatory Visit: Payer: Self-pay

## 2023-09-24 ENCOUNTER — Telehealth: Payer: Self-pay

## 2023-09-24 ENCOUNTER — Other Ambulatory Visit (HOSPITAL_COMMUNITY): Payer: Self-pay

## 2023-09-24 DIAGNOSIS — D62 Acute posthemorrhagic anemia: Secondary | ICD-10-CM | POA: Diagnosis not present

## 2023-09-24 DIAGNOSIS — K922 Gastrointestinal hemorrhage, unspecified: Secondary | ICD-10-CM

## 2023-09-24 LAB — CBC
HCT: 25.9 % — ABNORMAL LOW (ref 39.0–52.0)
Hemoglobin: 8.8 g/dL — ABNORMAL LOW (ref 13.0–17.0)
MCH: 31.1 pg (ref 26.0–34.0)
MCHC: 34 g/dL (ref 30.0–36.0)
MCV: 91.5 fL (ref 80.0–100.0)
Platelets: 155 10*3/uL (ref 150–400)
RBC: 2.83 MIL/uL — ABNORMAL LOW (ref 4.22–5.81)
RDW: 17.2 % — ABNORMAL HIGH (ref 11.5–15.5)
WBC: 4.4 10*3/uL (ref 4.0–10.5)
nRBC: 0 % (ref 0.0–0.2)

## 2023-09-24 LAB — BASIC METABOLIC PANEL
Anion gap: 4 — ABNORMAL LOW (ref 5–15)
BUN: 20 mg/dL (ref 6–20)
CO2: 24 mmol/L (ref 22–32)
Calcium: 7.6 mg/dL — ABNORMAL LOW (ref 8.9–10.3)
Chloride: 112 mmol/L — ABNORMAL HIGH (ref 98–111)
Creatinine, Ser: 1.75 mg/dL — ABNORMAL HIGH (ref 0.61–1.24)
GFR, Estimated: 44 mL/min — ABNORMAL LOW (ref >60)
Glucose, Bld: 112 mg/dL — ABNORMAL HIGH (ref 70–99)
Potassium: 3.6 mmol/L (ref 3.5–5.1)
Sodium: 140 mmol/L (ref 135–145)

## 2023-09-24 LAB — ALDOSTERONE + RENIN ACTIVITY W/ RATIO
ALDO / PRA Ratio: 6.2 (ref 0.0–30.0)
Aldosterone: 19.4 ng/dL (ref 0.0–30.0)
PRA LC/MS/MS: 3.144 ng/mL/h (ref 0.167–5.380)

## 2023-09-24 LAB — GLUCOSE, CAPILLARY: Glucose-Capillary: 104 mg/dL — ABNORMAL HIGH (ref 70–99)

## 2023-09-24 LAB — MAGNESIUM: Magnesium: 2 mg/dL (ref 1.7–2.4)

## 2023-09-24 LAB — CORTISOL: Cortisol, Plasma: 12 ug/dL

## 2023-09-24 LAB — TSH: TSH: 4.046 u[IU]/mL (ref 0.350–4.500)

## 2023-09-24 MED ORDER — FERROUS SULFATE 325 (65 FE) MG PO TABS
325.0000 mg | ORAL_TABLET | Freq: Two times a day (BID) | ORAL | 0 refills | Status: DC
  Filled 2023-09-24: qty 60, 30d supply, fill #0

## 2023-09-24 MED ORDER — MIDODRINE HCL 10 MG PO TABS
5.0000 mg | ORAL_TABLET | Freq: Two times a day (BID) | ORAL | 0 refills | Status: DC
  Filled 2023-09-24: qty 34, 34d supply, fill #0

## 2023-09-24 MED ORDER — SODIUM CHLORIDE 0.9 % IV BOLUS
500.0000 mL | Freq: Once | INTRAVENOUS | Status: AC
  Administered 2023-09-24: 500 mL via INTRAVENOUS

## 2023-09-24 MED ORDER — PANTOPRAZOLE SODIUM 40 MG PO TBEC
40.0000 mg | DELAYED_RELEASE_TABLET | Freq: Two times a day (BID) | ORAL | 1 refills | Status: DC
  Filled 2023-09-24: qty 60, 30d supply, fill #0

## 2023-09-24 MED ORDER — FOLIC ACID 1 MG PO TABS
1.0000 mg | ORAL_TABLET | Freq: Every day | ORAL | 0 refills | Status: DC
  Filled 2023-09-24: qty 30, 30d supply, fill #0

## 2023-09-24 MED ORDER — MIDODRINE HCL 5 MG PO TABS
10.0000 mg | ORAL_TABLET | Freq: Three times a day (TID) | ORAL | Status: DC
  Administered 2023-09-24: 10 mg via ORAL
  Filled 2023-09-24: qty 2

## 2023-09-24 NOTE — TOC Transition Note (Signed)
 Transition of Care [facility]) - Discharge Note   Patient Details  Name: Allen Russell MRN: [id number] Date of Birth: 1960/08/16  [facility] CM/SW Contact:  Gordy Clement, RN Phone Number: 09/24/2023, 9:32 AM   Clinical Narrative:    Patient will DC to home today. No TOC needs identified. Patient will follow up as directed on AVS              Patient Goals and CMS Choice            Discharge Placement                       Discharge Plan and Services Additional resources added to the After Visit Summary for                                       Social Drivers of Health (SDOH) Interventions SDOH Screenings   Food Insecurity: Food Insecurity Present (09/18/2023)  Housing: Low Risk  (09/18/2023)  Transportation Needs: No Transportation Needs (09/18/2023)  Utilities: Not At Risk (09/18/2023)  Depression (PHQ2-9): Low Risk  (10/19/2021)  Tobacco Use: Low Risk  (09/21/2023)     Readmission Risk Interventions     No data to display

## 2023-09-24 NOTE — Telephone Encounter (Signed)
 Order and reminder in epic for cbc, Meckels scan order placed, rad scheduling to contact pt regarding scheduling appt. Pt appt slot held for 10/16/23@1 :30pm with [facility] PA. Appt letter mailed to pt.

## 2023-09-24 NOTE — Discharge Instructions (Addendum)
 Follow with Primary MD [name], PA-C in 3 days   Get CBC, CMP, anemia panel-  checked next visit with your primary MD   Activity: As tolerated with Full fall precautions use walker/cane & assistance as needed  Disposition Home    Diet: Heart Healthy, low carbohydrate diet, check CBGs q. [facility] S.  Special Instructions: If you have smoked or chewed Tobacco  in the last 2 yrs please stop smoking, stop any regular Alcohol  and or any Recreational drug use.  On your next visit with your primary care physician please Get Medicines reviewed and adjusted.  Please request your Prim.MD to go over all Hospital Tests and Procedure/Radiological results at the follow up, please get all Hospital records sent to your Prim MD by signing hospital release before you go home.  If you experience worsening of your admission symptoms, develop shortness of breath, life threatening emergency, suicidal or homicidal thoughts you must seek medical attention immediately by calling 911 or calling your MD immediately  if symptoms less severe.  You Must read complete instructions/literature along with all the possible adverse reactions/side effects for all the Medicines you take and that have been prescribed to you. Take any new Medicines after you have completely understood and accpet all the possible adverse reactions/side effects.   Do not drive when taking Pain medications.  Do not take more than prescribed Pain, Sleep and Anxiety Medications

## 2023-09-24 NOTE — Telephone Encounter (Signed)
 PT called to schedule a FU after being release from [facility]. PT is willing to take 4/15 appointment. Please advise of any further details.

## 2023-09-24 NOTE — Telephone Encounter (Signed)
-----   Message from Mike Gip sent at 09/24/2023  9:16 AM EDT ----- Regarding: outpt follow up, and meckels scan Pt being D/C today after admit with GI bleed- will need office follow up  also needs CBC in one week and scheduled for a Meckels scan .

## 2023-09-24 NOTE — Progress Notes (Signed)
 Marland Kitchen

## 2023-09-24 NOTE — Anesthesia Postprocedure Evaluation (Signed)
 Anesthesia Post Note  Patient: Allen Russell  Procedure(s) Performed: ENTEROSCOPY IMAGING PROCEDURE, GI TRACT, INTRALUMINAL, VIA CAPSULE TATTOOING, USING INTRADERMAL PIGMENT     Patient location during evaluation: Endoscopy Anesthesia Type: MAC Level of consciousness: awake and alert Pain management: pain level controlled Vital Signs Assessment: post-procedure vital signs reviewed and stable Respiratory status: spontaneous breathing Cardiovascular status: stable Anesthetic complications: no   No notable events documented.  Last Vitals:  Vitals:   09/23/23 0327 09/23/23 0848  BP:    Pulse:    Resp:  18  Temp: 36.6 C 36.7 C  SpO2:  98%    Last Pain:  Vitals:   09/24/23 0730  TempSrc:   PainSc: 0-No pain                 Lewie Loron

## 2023-09-24 NOTE — Discharge Summary (Signed)
 CRIMSON BEER ZOX:[id number] DOB: [birth year] DOA: 09/17/2023  PCP: [name], PA-C  Admit date: 09/17/2023  Discharge date: 09/24/2023  Admitted From: Home   Disposition:  Home   Recommendations for Outpatient Follow-up:   Follow up with PCP in 1-2 weeks  PCP Please obtain BMP/CBC, 2 view CXR in 1week,  (see Discharge instructions)   PCP Please follow up on the following pending results:    Home Health: None   Equipment/Devices: None  Consultations: GI Discharge Condition: Stable    CODE STATUS: Full    Diet Recommendation: Heart Healthy Low Carb    Chief Complaint  Patient presents with   Hypotension     Brief history of present illness from the day of admission and additional interim summary     63 y.o. 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    Upon admission endoscopy showed erosive esophagitis but no active bleeding.  Continued to have melanotic stool with drop in hemoglobin requiring multiple transfusion therefore obtain CTA which was negative.  In by GI underwent EGD and capsule enteroscopy.  No active bleeding.                                                                   Hospital Course   Acute blood loss anemia secondary erosive esophagitis, melanotic stools.  With hypotension and syncope   - Concerns for upper GI bleed with hemoglobin of 5.4.  Endoscopy showed erosive esophagitis but no active bleeding.   Continues to drop his hemoglobin requiring multiple units of PRBC.  CTA negative for any active bleeding.     Underwent EGD with enteroscopy showing LA Grade C erosive esophagitis with no bleeding. Gastric varices, without bleeding. He also had completion of the Video Capsule Enteroscope placement into the stomach. Acute angulation of the prepyloric region into the duodenal bulb precluded ability to deploy Video capsule into the small bowel.   Video capsule enteroscopy - no evidence of bleeding or recent blood loss, other than a few small flecks of blood in the duodenum. No AVMs were seen throughout the small intestine. In the distal ileum, there appeared to be a diverticulum. There was some stool/food residue within the diverticulum, but no bleeding stigmata.     CBC seems to have stabilized, blood pressure improved on low-dose midodrine, absolutely symptom-free now, ambulating without any problems, no dizziness, will be discharged home discussed with GI, outpatient Meckel scan possible.  Patient placed on PPI along with iron and folic acid, PCP to monitor anemia  panel and CBC closely, recommend outpatient GI follow-up in 1 to 2 weeks as well.     HIV.  Home regimen continued.     CKD stage IIIa  In the past 2 years creatinine has been around 2.2.  Patient does not have AKI, at or better than baseline.   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  A1c is 7.2, reasonable control, renew home regimen unchanged      Discharge diagnosis     Principal Problem:   ABLA (acute blood loss anemia) Active Problems:   Acute kidney injury superimposed on chronic kidney disease (HCC)   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)   Hypotension   Chronic pancreatitis (HCC)   Syncope and collapse   Melena   Gastrointestinal hemorrhage   Gastroesophageal reflux disease with esophagitis  without hemorrhage   Gastric varices    Discharge instructions    Discharge Instructions     Discharge instructions   Complete by: As directed    Follow with Primary MD [name], PA-C in 3 days   Get CBC, CMP, anemia panel-  checked next visit with your primary MD   Activity: As tolerated with Full fall precautions use walker/cane & assistance as needed  Disposition Home    Diet: Heart Healthy, low carbohydrate diet, check CBGs q. [facility]CH S.  Special Instructions: If you have smoked or chewed Tobacco  in the last 2 yrs please stop smoking, stop any regular Alcohol  and or any Recreational drug use.  On your next visit with your primary care physician please Get Medicines reviewed and adjusted.  Please request your Prim.MD to go over all Hospital Tests and Procedure/Radiological results at the follow up, please get all Hospital records sent to your Prim MD by signing hospital release before you go home.  If you experience worsening of your admission symptoms, develop shortness of breath, life threatening emergency, suicidal or homicidal thoughts you must seek medical attention immediately by calling 911 or calling your MD immediately  if symptoms less severe.  You Must read complete instructions/literature along with all the possible adverse reactions/side effects for all the Medicines you take and that have been prescribed to you. Take any new Medicines after you have completely understood and accpet all the possible adverse reactions/side effects.   Do not drive when taking Pain medications.  Do not take more than prescribed Pain, Sleep and Anxiety Medications   Increase activity slowly   Complete by: As directed        Discharge Medications   Allergies as of 09/24/2023       Reactions   Morphine Other (See Comments)   headache        Medication List     STOP taking these medications    cyclobenzaprine 10 MG tablet Commonly known as: FLEXERIL       TAKE  these medications    Baqsimi One Pack 3 MG/DOSE Powd Generic drug: Glucagon Place 1 spray into the nose once.   Descovy 200-25 MG tablet Generic drug: emtricitabine-tenofovir AF Take 1 tablet by mouth daily.   diazepam 2 MG tablet Commonly known as: VALIUM Take 2 mg by mouth at bedtime as needed (for leg cramps).   ferrous sulfate 325 (65 FE) MG EC tablet Take 1 tablet (325 mg total) by mouth 2 (two) times daily.   folic acid 1 MG tablet Commonly known as: FOLVITE Take 1  tablet (1 mg total) by mouth daily.   insulin aspart 100 UNIT/ML injection Commonly known as: novoLOG Inject 0-5 Units into the skin 2 (two) times daily as needed (for BG greater than 180).   lipase/protease/amylase 04540 UNITS Cpep capsule Commonly known as: CREON Take 1 capsule (36,000 Units total) by mouth 3 (three) times daily with meals. What changed:  how much to take additional instructions   Microlet Lancets Misc Use as directed to test blood sugar   midodrine 10 MG tablet Commonly known as: PROAMATINE Take 0.5 tablets (5 mg total) by mouth 2 (two) times daily with a meal.   multivitamin tablet Take 1 tablet by mouth daily.   pantoprazole 40 MG tablet Commonly known as: Protonix Take 1 tablet (40 mg total) by mouth 2 (two) times daily before a meal.   tadalafil 20 MG tablet Commonly known as: CIALIS Take 20 mg by mouth as needed for erectile dysfunction.   traZODone 50 MG tablet Commonly known as: DESYREL Take 1 tablet by mouth daily.   Evaristo Bury FlexTouch 100 UNIT/ML FlexTouch Pen Generic drug: insulin degludec INJECT 12 AT BEDTIME   True Metrix Blood Glucose Test test strip Generic drug: glucose blood Use as directed to test blood sugar   True Metrix Meter w/Device Kit Use up to four times daily as directed.   Unifine Pentips 32G X 4 MM Misc Generic drug: Insulin Pen Needle Use 1 as directed in the morning, at noon, in the evening, and at bedtime (4 times a day) with  insulin   Vitamin D3 75 MCG (3000 UT) Tabs Take 3,000 Units by mouth daily.         Follow-up Information     [name], [place]. Schedule an appointment as soon as possible for a visit in 3 day(s).   Specialty: Physician Assistant Contact information: [address] [place] [address] [phone]         Shellia Cleverly, DO. Schedule an appointment as soon as possible for a visit in 1 week(s).   Specialty: Gastroenterology Contact information: [address] [place] [address] [phone]                 Major procedures and Radiology Reports - PLEASE review detailed and final reports thoroughly  -       CT ANGIO GI BLEED Result Date: 09/20/2023 CLINICAL DATA:  GI bleed EXAM: CTA ABDOMEN AND PELVIS WITHOUT AND WITH CONTRAST TECHNIQUE: Multidetector CT imaging of the abdomen and pelvis was performed using the standard protocol during bolus administration of intravenous contrast. Multiplanar reconstructed images and MIPs were obtained and reviewed to evaluate the vascular anatomy. RADIATION DOSE REDUCTION: This exam was performed according to the departmental dose-optimization program which includes automated exposure control, adjustment of the mA and/or kV according to patient size and/or use of iterative reconstruction technique. CONTRAST:  OMNIPAQUE IOHEXOL 350 MG/ML SOLN COMPARISON:  CT chest abdomen pelvis, 12/07/2002 FINDINGS: VASCULAR Normal contour and caliber of the abdominal aorta. No evidence of aneurysm, dissection, or other acute aortic pathology. Standard branching pattern of the abdominal aorta with solitary bilateral renal arteries. Mild aortic atherosclerosis. Bili calcinosis. Review of the MIP images confirms the above findings. NON-VASCULAR Lower Chest: No acute findings. Hepatobiliary: No focal liver abnormality is seen. Status post cholecystectomy. Unchanged postoperative biliary ductal dilatation. Pancreas: Unchanged, severe  stigmata of chronic pancreatitis, including coarse calcifications throughout the atrophic pancreatic parenchyma as well as a very scarred and tethered appearance of tissue  about the pancreatic body and tail, encases the celiac vessels and effaces the splenic vein. Extensive collateralization about the left upper quadrant, including large gastroesophageal and mesenteric varices Spleen: Normal in size without significant abnormality. Adrenals/Urinary Tract: Adrenal glands are unremarkable. Kidneys are normal, without renal calculi, solid lesion, or hydronephrosis. Bladder is unremarkable. Stomach/Bowel: Stomach is within normal limits. Appendix appears normal. No evidence of bowel wall thickening, distention, or inflammatory changes. No intraluminal contrast extravasation or other findings to specifically localize reported GI bleeding. Lymphatic: No enlarged abdominal or pelvic lymph nodes. Reproductive: No mass or other significant abnormality. Other: No abdominal wall hernia or abnormality. No ascites. Musculoskeletal: No acute osseous findings. IMPRESSION: 1. No evidence of intraluminal contrast extravasation or other findings to specifically localize reported GI bleeding. 2. Unchanged, severe stigmata of chronic pancreatitis, including effacement of the splenic vein with large gastroesophageal and mesenteric varices. Aortic Atherosclerosis (ICD10-I70.0). Electronically Signed   By: Jearld Lesch M.D.   On: 09/20/2023 19:03   DG Chest Portable 1 View Result Date: 09/17/2023 CLINICAL DATA:  Altered level of consciousness, hypotension EXAM: PORTABLE CHEST 1 VIEW COMPARISON:  03/24/2021 FINDINGS: The heart size and mediastinal contours are within normal limits. Both lungs are clear. The visualized skeletal structures are unremarkable. IMPRESSION: No active disease. Electronically Signed   By: Sharlet Salina M.D.   On: 09/17/2023 17:50    Micro Results     Recent Results (from the past 240 hours)  Resp panel  by RT-PCR (RSV, Flu A&B, Covid)     Status: None   Collection Time: 09/17/23  4:52 PM   Specimen: Nasal Swab  Result Value Ref Range Status   SARS Coronavirus 2 by RT PCR NEGATIVE NEGATIVE Final   Influenza A by PCR NEGATIVE NEGATIVE Final   Influenza B by PCR NEGATIVE NEGATIVE Final    Comment: (NOTE) The Xpert Xpress SARS-CoV-2/FLU/RSV plus assay is intended as an aid in the diagnosis of influenza from Nasopharyngeal swab specimens and should not be used as a sole basis for treatment. Nasal washings and aspirates are unacceptable for Xpert Xpress SARS-CoV-2/FLU/RSV testing.  Fact Sheet for Patients: BloggerCourse.com  Fact Sheet for Healthcare Providers: SeriousBroker.it  This test is not yet approved or cleared by the [place] FDA and has been authorized for detection and/or diagnosis of SARS-CoV-2 by FDA under an Emergency Use Authorization (EUA). This EUA will remain in effect (meaning this test can be used) for the duration of the COVID-19 declaration under Section 564(b)(1) of the Act, 21 U.S.C. section 360bbb-3(b)(1), unless the authorization is terminated or revoked.     Resp Syncytial Virus by PCR NEGATIVE NEGATIVE Final    Comment: (NOTE) Fact Sheet for Patients: BloggerCourse.com  Fact Sheet for Healthcare Providers: SeriousBroker.it  This test is not yet approved or cleared by the [place] FDA and has been authorized for detection and/or diagnosis of SARS-CoV-2 by FDA under an Emergency Use Authorization (EUA). This EUA will remain in effect (meaning this test can be used) for the duration of the COVID-19 declaration under Section 564(b)(1) of the Act, 21 U.S.C. section 360bbb-3(b)(1), unless the authorization is terminated or revoked.  Performed at [facility], [address], [place], [place] [address]   Blood culture (routine x 2)      Status: None   Collection Time: 09/17/23  5:00 PM   Specimen: BLOOD  Result Value Ref Range Status   Specimen Description BLOOD SITE NOT SPECIFIED  Final   Special Requests  Final    BOTTLES DRAWN AEROBIC AND ANAEROBIC Blood Culture results may not be optimal due to an inadequate volume of blood received in culture bottles   Culture   Final    NO GROWTH 5 DAYS Performed at [facility], [address], [place], [place] [address]    Report Status 09/22/2023 FINAL  Final  Blood culture (routine x 2)     Status: None   Collection Time: 09/17/23  6:45 PM   Specimen: BLOOD  Result Value Ref Range Status   Specimen Description BLOOD SITE NOT SPECIFIED  Final   Special Requests   Final    BOTTLES DRAWN AEROBIC ONLY Blood Culture results may not be optimal due to an inadequate volume of blood received in culture bottles   Culture   Final    NO GROWTH 5 DAYS Performed at [facility], [address] [place], [place] [address]    Report Status 09/22/2023 FINAL  Final  MRSA Next Gen by PCR, Nasal     Status: None   Collection Time: 09/19/23  1:51 AM   Specimen: Nasal Mucosa; Nasal Swab  Result Value Ref Range Status   MRSA by PCR Next Gen NOT DETECTED NOT DETECTED Final    Comment: (NOTE) The GeneXpert MRSA Assay (FDA approved for NASAL specimens only), is one component of a comprehensive MRSA colonization surveillance program. It is not intended to diagnose MRSA infection nor to guide or monitor treatment for MRSA infections. Test performance is not FDA approved in patients less than 67 years old. Performed at [facility], [address], [place], [place] [address]     Today   Subjective    Lou Loewe today has no headache,no chest abdominal pain,no new weakness tingling or numbness, feels much better wants to go home today.     Objective   Blood pressure 114/60, pulse 60, temperature (!) 97.5 F (36.4 C), temperature source Oral, resp. rate 18, height 6'  4" (1.93 m), weight 94.2 kg, SpO2 98%.   Intake/Output Summary (Last 24 hours) at 09/24/2023 0919 Last data filed at 09/23/2023 2125 Gross per 24 hour  Intake 3 ml  Output --  Net 3 ml    Exam  Awake Alert, No new F.N deficits,    [place].AT,PERRAL Supple Neck,   Symmetrical Chest wall movement, Good air movement bilaterally, CTAB RRR,No Gallops,   +ve B.Sounds, Abd Soft, Non tender,  No Cyanosis, Clubbing or edema    Data Review   Recent Labs  Lab 09/17/23 1715 09/18/23 0617 09/18/23 1943 09/19/23 1142 09/22/23 0004 09/22/23 0820 09/22/23 1539 09/22/23 2106 09/22/23 2347 09/23/23 0555 09/24/23 0440  WBC 10.4   < > 8.6   < > 3.7*  --  3.4* 3.9*  --  4.0 4.4  HGB 8.7*   < > 6.5*   < > 7.8*   < > 8.4* 8.6* 8.4* 9.1* 8.8*  HCT 25.1*   < > 18.5*   < > 22.2*   < > 24.0* 25.1* 24.4* 26.5* 25.9*  PLT 218   < > 174   < > 108*  --  137* 143*  --  162 155  MCV 89.3   < > 86.9   < > 87.7  --  88.6 90.0  --  90.1 91.5  MCH 31.0   < > 30.5   < > 30.8  --  31.0 30.8  --  31.0 31.1  MCHC 34.7   < >  35.1   < > 35.1  --  35.0 34.3  --  34.3 34.0  RDW 13.3   < > 13.9   < > 15.9*  --  16.5* 17.2*  --  17.0* 17.2*  LYMPHSABS 0.9  --  2.9  --   --   --  1.0  --   --   --   --   MONOABS 0.8  --  0.6  --   --   --  0.5  --   --   --   --   EOSABS 0.0  --  0.0  --   --   --  0.1  --   --   --   --   BASOSABS 0.0  --  0.2*  --   --   --  0.0  --   --   --   --    < > = values in this interval not displayed.    Recent Labs  Lab 09/17/23 1715 09/17/23 1722 09/17/23 2051 09/18/23 0507 09/19/23 2319 09/21/23 0445 09/22/23 0004 09/23/23 0555 09/24/23 0440  NA 133*  --   --  135 137 139 139 141 140  K 5.8*  --   --  4.2 3.8 3.2* 3.5 3.6 3.6  CL 101  --   --  109 109 110 109 109 112*  CO2 21*  --   --  20* 22 24 24 26 24   ANIONGAP 11  --   --  6 6 5 6 6  4*  GLUCOSE 219*  --   --  256* 186* 87 187* 123* 112*  BUN 67*  --   --  65* 56* 29* 17 16 20   CREATININE 1.92*  --   --  1.79* 1.74*  1.70* 1.72* 1.71* 1.75*  AST 31  --   --   --   --   --   --   --   --   ALT 34  --   --   --   --   --   --   --   --   ALKPHOS 45  --   --   --   --   --   --   --   --   BILITOT 0.5  --   --   --   --   --   --   --   --   ALBUMIN 3.0*  --   --   --   --   --   --   --   --   LATICACIDVEN  --  2.4* 1.5  --   --   --   --   --   --   TSH 1.809  --   --   --   --   --   --   --   --   HGBA1C  --   --   --  7.2*  --   --   --   --   --   MG  --   --   --   --  1.6* 1.9 1.9 2.0 2.0  CALCIUM 8.4*  --   --  7.5* 7.3* 7.2* 7.6* 8.0* 7.6*    Total Time in preparing paper work, data evaluation and todays exam - 35 minutes  Signature  -    Susa Raring M.D on 09/24/2023 at 9:19 AM   -  To page go to  [email or web address]

## 2023-09-24 NOTE — Telephone Encounter (Signed)
 Noted.

## 2023-09-24 NOTE — Progress Notes (Signed)
 DISCHARGE NOTE HOME Allen Russell to be discharged Home per MD order. Discussed prescriptions and follow up appointments with the patient. Prescriptions given to patient; medication list explained in detail. Patient verbalized understanding.  Skin clean, dry and intact without evidence of skin break down, no evidence of skin tears noted. IV catheter discontinued intact. Site without signs and symptoms of complications. Dressing and pressure applied. Pt denies pain at the site currently. No complaints noted.  Patient free of lines, drains, and wounds.   An After Visit Summary (AVS) was printed and given to the patient. Patient escorted via wheelchair, and discharged home via private auto.  Velia Meyer, RN

## 2023-10-01 ENCOUNTER — Telehealth: Payer: Self-pay

## 2023-10-01 NOTE — Telephone Encounter (Signed)
 Meckels scan scheduled for 10/04/23 at 8 am.  Lab order in epic. MyChart message sent to patient with lab reminder.

## 2023-10-01 NOTE — Telephone Encounter (Signed)
-----   Message from Nurse Kerrie Buffalo sent at 09/24/2023  9:44 AM EDT ----- Regarding: FW: outpt follow up, and meckels scan Cbc needed in 1 week. ----- Message ----- From: Sammuel Cooper, PA-C Sent: 09/24/2023   9:21 AM EDT To: Jenel Lucks, MD; [facility] A Triage Subject: outpt follow up, and meckels scan              Pt being D/C today after admit with GI bleed- will need office follow up  also needs CBC in one week and scheduled for a Meckels scan .

## 2023-10-04 ENCOUNTER — Encounter (HOSPITAL_COMMUNITY)
Admission: RE | Admit: 2023-10-04 | Discharge: 2023-10-04 | Disposition: A | Discharge status: Home / Self Care | Source: Ambulatory Visit | Attending: Physician Assistant | Admitting: Physician Assistant

## 2023-10-04 DIAGNOSIS — K922 Gastrointestinal hemorrhage, unspecified: Secondary | ICD-10-CM | POA: Diagnosis present

## 2023-10-04 MED ORDER — SODIUM PERTECHNETATE TC 99M INJECTION
10.7800 | Freq: Once | INTRAVENOUS | Status: AC | PRN
  Administered 2023-10-04: 10.78 via INTRAVENOUS

## 2023-10-16 ENCOUNTER — Other Ambulatory Visit (INDEPENDENT_AMBULATORY_CARE_PROVIDER_SITE_OTHER)

## 2023-10-16 ENCOUNTER — Encounter: Payer: Self-pay | Admitting: Gastroenterology

## 2023-10-16 ENCOUNTER — Ambulatory Visit: Admitting: Gastroenterology

## 2023-10-16 VITALS — BP 126/80 | HR 76 | Ht 76.0 in | Wt 219.0 lb

## 2023-10-16 DIAGNOSIS — K860 Alcohol-induced chronic pancreatitis: Secondary | ICD-10-CM

## 2023-10-16 DIAGNOSIS — D649 Anemia, unspecified: Secondary | ICD-10-CM | POA: Diagnosis not present

## 2023-10-16 DIAGNOSIS — R5383 Other fatigue: Secondary | ICD-10-CM | POA: Diagnosis not present

## 2023-10-16 DIAGNOSIS — K2210 Ulcer of esophagus without bleeding: Secondary | ICD-10-CM | POA: Diagnosis not present

## 2023-10-16 DIAGNOSIS — K8689 Other specified diseases of pancreas: Secondary | ICD-10-CM

## 2023-10-16 DIAGNOSIS — K922 Gastrointestinal hemorrhage, unspecified: Secondary | ICD-10-CM

## 2023-10-16 DIAGNOSIS — D509 Iron deficiency anemia, unspecified: Secondary | ICD-10-CM

## 2023-10-16 DIAGNOSIS — K861 Other chronic pancreatitis: Secondary | ICD-10-CM | POA: Diagnosis not present

## 2023-10-16 DIAGNOSIS — Z860101 Personal history of adenomatous and serrated colon polyps: Secondary | ICD-10-CM

## 2023-10-16 DIAGNOSIS — K8681 Exocrine pancreatic insufficiency: Secondary | ICD-10-CM

## 2023-10-16 LAB — COMPREHENSIVE METABOLIC PANEL WITH GFR
ALT: 21 U/L (ref 0–53)
AST: 20 U/L (ref 0–37)
Albumin: 4.7 g/dL (ref 3.5–5.2)
Alkaline Phosphatase: 69 U/L (ref 39–117)
BUN: 33 mg/dL — ABNORMAL HIGH (ref 6–23)
CO2: 28 meq/L (ref 19–32)
Calcium: 9.2 mg/dL (ref 8.4–10.5)
Chloride: 101 meq/L (ref 96–112)
Creatinine, Ser: 1.82 mg/dL — ABNORMAL HIGH (ref 0.40–1.50)
GFR: 39.93 mL/min — ABNORMAL LOW (ref >60.00)
Glucose, Bld: 88 mg/dL (ref 70–99)
Potassium: 4.1 meq/L (ref 3.5–5.1)
Sodium: 136 meq/L (ref 135–145)
Total Bilirubin: 0.5 mg/dL (ref 0.2–1.2)
Total Protein: 7.1 g/dL (ref 6.0–8.3)

## 2023-10-16 LAB — CBC WITH DIFFERENTIAL/PLATELET
Basophils Absolute: 0 10*3/uL (ref 0.0–0.1)
Basophils Relative: 0.5 % (ref 0.0–3.0)
Eosinophils Absolute: 0.1 10*3/uL (ref 0.0–0.7)
Eosinophils Relative: 2.3 % (ref 0.0–5.0)
HCT: 41.1 % (ref 39.0–52.0)
Hemoglobin: 13.5 g/dL (ref 13.0–17.0)
Lymphocytes Relative: 16.2 % (ref 12.0–46.0)
Lymphs Abs: 0.7 10*3/uL (ref 0.7–4.0)
MCHC: 32.9 g/dL (ref 30.0–36.0)
MCV: 95.4 fl (ref 78.0–100.0)
Monocytes Absolute: 0.4 10*3/uL (ref 0.1–1.0)
Monocytes Relative: 10.3 % (ref 3.0–12.0)
Neutro Abs: 3 10*3/uL (ref 1.4–7.7)
Neutrophils Relative %: 70.7 % (ref 43.0–77.0)
Platelets: 193 10*3/uL (ref 150.0–400.0)
RBC: 4.3 Mil/uL (ref 4.22–5.81)
RDW: 16.4 % — ABNORMAL HIGH (ref 11.5–15.5)
WBC: 4.3 10*3/uL (ref 4.0–10.5)

## 2023-10-16 LAB — IBC + FERRITIN
Ferritin: 40.5 ng/mL (ref 22.0–322.0)
Iron: 88 ug/dL (ref 42–165)
Saturation Ratios: 19.6 % — ABNORMAL LOW (ref 20.0–50.0)
TIBC: 449.4 ug/dL (ref 250.0–450.0)
Transferrin: 321 mg/dL (ref 212.0–360.0)

## 2023-10-16 LAB — B12 AND FOLATE PANEL
Folate: >25.2 ng/mL (ref >5.9)
Vitamin B-12: 824 pg/mL (ref 211–911)

## 2023-10-16 MED ORDER — NA SULFATE-K SULFATE-MG SULF 17.5-3.13-1.6 GM/177ML PO SOLN: 1.0000 | Freq: Once | ORAL | 0 refills | Status: AC

## 2023-10-16 NOTE — Patient Instructions (Signed)
 You have been scheduled for an endoscopy and colonoscopy. Please follow the written instructions given to you at your visit today.  If you use inhalers (even only as needed), please bring them with you on the day of your procedure.  DO NOT TAKE 7 DAYS PRIOR TO TEST- Trulicity (dulaglutide) Ozempic, Wegovy (semaglutide) Mounjaro (tirzepatide) Bydureon Bcise (exanatide extended release)  DO NOT TAKE 1 DAY PRIOR TO YOUR TEST Rybelsus (semaglutide) Adlyxin (lixisenatide) Victoza (liraglutide) Byetta (exanatide) ___________________________________________________________________________ Your provider has requested that you go to the basement level for lab work before leaving today. Press "B" on the elevator. The lab is located at the first door on the left as you exit the elevator.  Due to recent changes in healthcare laws, you may see the results of your imaging and laboratory studies on MyChart before your provider has had a chance to review them.  We understand that in some cases there may be results that are confusing or concerning to you. Not all laboratory results come back in the same time frame and the provider may be waiting for multiple results in order to interpret others.  Please give us  48 hours in order for your provider to thoroughly review all the results before contacting the office for clarification of your results.   Thank you for trusting me with your gastrointestinal care!   Suzanna Erp, PA  _______________________________________________________  If your blood pressure at your visit was 140/90 or greater, please contact your primary care physician to follow up on this.  _______________________________________________________  If you are age 16 or older, your body mass index should be between 23-30. Your Body mass index is 26.66 kg/m. If this is out of the aforementioned range listed, please consider follow up with your Primary Care Provider.  If you are age 15  or younger, your body mass index should be between 19-25. Your Body mass index is 26.66 kg/m. If this is out of the aformentioned range listed, please consider follow up with your Primary Care Provider.   ________________________________________________________  The [facility] providers would like to encourage you to use MYCHART to communicate with providers for non-urgent requests or questions.  Due to long hold times on the telephone, sending your provider a message by [facility] may be a faster and more efficient way to get a response.  Please allow 48 business hours for a response.  Please remember that this is for non-urgent requests.  _______________________________________________________

## 2023-10-16 NOTE — Progress Notes (Addendum)
 Chief Complaint: Hospital follow-up Primary GI MD: Dr. 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  Patient previously followed by [facility] (Dr. Octaviano Glow)  Admitted 09/18/2023 with symptomatic anemia characterized by melena and hypotension.  Hgb on arrival 8.7 declining to 5.4.  Inpatient workup including EGD, CTA, the capsule endoscopy showed no active bleeding.  EGD showed LA grade C erosive esophagitis.  Enteroscopy unrevealing.  Capsule endoscopy without active bleed (though questionable ileal diverticulum/Meckel's diverticulum).  Patient then underwent Meckel scan which was negative.  Recent labs 09/24/2023: Hgb 8.8, MCV 91.5, BUN 20  -------------------TODAY-----------------  Patient denies overt bleeding.  States he has soft brown stools.  Takes his Creon regularly (36,002 capsules with meals 1 capsule with snacks).  He occasionally has abdominal pain after eating likely coinciding with his history of chronic pancreatitis.  He expresses his frustration with the medical system and that he has these continued symptoms of fatigue, hypotension, anemia, and has been to multiple specialists including endocrinology, neurology, urology who have not been able to diagnose him or improve his symptoms.  His colonoscopy in 2023 showed 4 subcentimeter polyps and he states he was supposed to come back in 2024 for further evaluation   PREVIOUS GI WORKUP   Meckel scan negative  Capsule endoscopy 09/21/23 recurrent drop in hemoglobin with EGD showing LA grade C esophagitis, no active bleeding.  CTA negative for bleeding.  Small bowel endoscopy negative for bleeding. - No evidence of bleeding in entire small bowel distal to duodenum.  Questionable  ileal diverticulum/Meckel's diverticulum  Small bowel enteroscopy 09/21/2023 - Normal upper third of esophagus and middle third of esophagus.  - LA Grade C erosive esophagitis with no bleeding.  - Gastric varices, without bleeding.  - Normal gastric body and antrum.  - Normal cardia.  - Normal examined duodenum.  - The examined portion of the jejunum was normal. Tattooed.  - Successful completion of the Video Capsule Enteroscope placement into the stomach. Acute angulation of the prepyloric region into the duodenal bulb precluded ability to deploy video capsule into the small bowel.  - No specimens collected.  EGD 09/19/2023 - Normal upper third of esophagus and middle third of esophagus.  - LA Grade C erosive esophagitis with no bleeding.  - Hematin ( altered blood/ coffee- ground- like material) in the entire stomach.  - Normal duodenal bulb and second portion of the duodenum.  - No specimens collected.  - EGD shows evidence of previous bleeding in the upper GI tract based upon scattered hematin throughout the stomach. The only pathologic finding identified was grade C esophagitis which could have contributed to bleeding although would be less likely to cause hypotension. A Dieulafoy lesion is another consideration which could cause intermittent bleeding leading to an absence of significant findings on EGD today. - Repeat 8 to 12 weeks  Colonoscopy 01/2022 4 subcentimeter polyps were removed   EGD 02/14/18 with collapsed cavity without necrosis, axios left in place.  EGD 03/29/18 with concern for 2cm segment of suspected Barrett's (path negative), AXIOS stent removed   Past Medical History:  Diagnosis Date   'Light-for-dates' infant with signs of fetal malnutrition 04/12/2021   Adrenal insufficiency (HCC) 04/12/2021   Chronic kidney disease    Per pt, fluctuates between Stage IIIB and IV from April 2023.   Diabetes mellitus without  complication (HCC)    Ketoacidosis 03/21/2021    Shock (HCC)     Past Surgical History:  Procedure Laterality Date   APPENDECTOMY     BACK SURGERY     CHOLECYSTECTOMY     ENTEROSCOPY N/A 09/21/2023   Procedure: ENTEROSCOPY;  Surgeon: Ottie Glazier, MD;  Location: [facility] ENDOSCOPY;  Service: Gastroenterology;  Laterality: N/A;   ESOPHAGOGASTRODUODENOSCOPY N/A 09/19/2023   Procedure: EGD (ESOPHAGOGASTRODUODENOSCOPY);  Surgeon: Ottie Glazier, MD;  Location: [facility] ENDOSCOPY;  Service: Gastroenterology;  Laterality: N/A;   GIVENS CAPSULE STUDY N/A 09/21/2023   Procedure: IMAGING PROCEDURE, GI TRACT, INTRALUMINAL, VIA CAPSULE;  Surgeon: Ottie Glazier, MD;  Location: [facility] ENDOSCOPY;  Service: Gastroenterology;  Laterality: N/A;   HERNIA REPAIR      Current Outpatient Medications  Medication Sig Dispense Refill   BAQSIMI ONE PACK 3 MG/DOSE POWD Place 1 spray into the nose once.     Blood Glucose Monitoring Suppl (TRUE METRIX METER) w/Device KIT Use up to four times daily as directed. 1 kit 0   Cholecalciferol (VITAMIN D3) 75 MCG (3000 UT) TABS Take 3,000 Units by mouth daily.     DESCOVY 200-25 MG tablet Take 1 tablet by mouth daily.     diazepam (VALIUM) 2 MG tablet Take 2 mg by mouth at bedtime as needed (for leg cramps).     ferrous sulfate 325 (65 FE) MG tablet Take 1 tablet (325 mg total) by mouth 2 (two) times daily. 60 tablet 0   folic acid (FOLVITE) 1 MG tablet Take 1 tablet (1 mg total) by mouth daily. 30 tablet 0   glucose blood (TRUE METRIX BLOOD GLUCOSE TEST) test strip Use as directed to test blood sugar 200 each 2   insulin aspart (NOVOLOG) 100 UNIT/ML injection Inject 0-5 Units into the skin 2 (two) times daily as needed (for BG greater than 180).     Insulin Pen Needle 32G X 4 MM MISC Use 1 as directed in the morning, at noon, in the evening, and at bedtime (4 times a day) with insulin 100 each 3   lipase/protease/amylase (CREON) 36000 UNITS CPEP capsule Take 1 capsule (36,000 Units total) by mouth 3 (three) times daily with  meals. (Patient taking differently: Take 72,000 Units by mouth 3 (three) times daily with meals. Take 2 capsules by mouth three times a day with meals and 1 capsule with snack) 100 capsule 5   Multiple Vitamin (MULTIVITAMIN) tablet Take 1 tablet by mouth daily.     Na Sulfate-K Sulfate-Mg Sulfate concentrate (SUPREP) 17.5-3.13-1.6 GM/177ML SOLN Take 1 kit (354 mLs total) by mouth once for 1 dose. 354 mL 0   pantoprazole (PROTONIX) 40 MG tablet Take 1 tablet (40 mg total) by mouth 2 (two) times daily before a meal. 60 tablet 1   tadalafil (CIALIS) 20 MG tablet Take 20 mg by mouth as needed for erectile dysfunction.     traZODone (DESYREL) 50 MG tablet Take 1 tablet by mouth daily.     TRESIBA FLEXTOUCH 100 UNIT/ML FlexTouch Pen INJECT 12 AT BEDTIME     TRUEplus Lancets 28G MISC Use as directed to test blood sugar 100 each 0   midodrine (PROAMATINE) 10 MG tablet Take 0.5 tablets (5 mg total) by mouth 2 (two) times daily with a meal. (Patient not taking: Reported on 10/16/2023) 60 tablet 0   No current facility-administered medications for this visit.    Allergies as of 10/16/2023 - Review Complete 10/16/2023  Allergen Reaction Noted  Morphine Other (See Comments) 05/16/2011    Family History  Problem Relation Age of Onset   Breast cancer Mother    Thyroid disease Mother    Breast cancer Sister    Pancreatic cancer Maternal Grandmother    Ovarian cancer Maternal Grandmother    Hypertension Paternal Grandfather     Social History   Socioeconomic History   Marital status: Single    Spouse name: Not on file   Number of children: Not on file   Years of education: Not on file   Highest education level: Bachelor's degree (e.g., BA, AB, BS)  Occupational History   Not on file  Tobacco Use   Smoking status: Never   Smokeless tobacco: Never  Vaping Use   Vaping status: Never Used  Substance and Sexual Activity   Alcohol use: Not Currently    Comment: last drink in 2020   Drug use:  Not Currently    Comment: 1998 ectasy   Sexual activity: Yes    Partners: Male    Birth control/protection: None  Other Topics Concern   Not on file  Social History Narrative   Not on file   Social Drivers of Health   Financial Resource Strain: Not on file  Food Insecurity: Food Insecurity Present (09/18/2023)   Hunger Vital Sign    Worried About Running Out of Food in the Last Year: Sometimes true    Ran Out of Food in the Last Year: Sometimes true  Transportation Needs: No Transportation Needs (09/18/2023)   PRAPARE - Administrator, Civil Service (Medical): No    Lack of Transportation (Non-Medical): No  Physical Activity: Not on file  Stress: Not on file  Social Connections: Not on file  Intimate Partner Violence: Not At Risk (09/18/2023)   Humiliation, Afraid, Rape, and Kick questionnaire    Fear of Current or Ex-Partner: No    Emotionally Abused: No    Physically Abused: No    Sexually Abused: No    Review of Systems:    Constitutional: No weight loss, fever, chills, weakness or fatigue HEENT: Eyes: No change in vision               Ears, Nose, Throat:  No change in hearing or congestion Skin: No rash or itching Cardiovascular: No chest pain, chest pressure or palpitations   Respiratory: No SOB or cough Gastrointestinal: See HPI and otherwise negative Genitourinary: No dysuria or change in urinary frequency Neurological: No headache, dizziness or syncope Musculoskeletal: No new muscle or joint pain Hematologic: No bleeding or bruising Psychiatric: No history of depression or anxiety    Physical Exam:  Vital signs: BP 126/80   Pulse 76   Ht 6\' 4"  (1.93 m)   Wt 219 lb (99.3 kg)   BMI 26.66 kg/m   Constitutional: NAD, alert and cooperative Head:  Normocephalic and atraumatic. Eyes:   PEERL, EOMI. No icterus. Conjunctiva pink. Respiratory: Respirations even and unlabored. Lungs clear to auscultation bilaterally.   No wheezes, crackles, or rhonchi.   Cardiovascular:  Regular rate and rhythm. No peripheral edema, cyanosis or pallor.  Gastrointestinal:  Soft, nondistended, nontender. No rebound or guarding. Normal bowel sounds. No appreciable masses or hepatomegaly. Rectal:  Declines Msk:  Symmetrical without gross deformities. Without edema, no deformity or joint abnormality.  Neurologic:  Alert and  oriented x4;  grossly normal neurologically.  Skin:   Dry and intact without significant lesions or rashes. Psychiatric: Oriented to person, place and time. Demonstrates good  judgement and reason without abnormal affect or behaviors.   RELEVANT LABS AND IMAGING: CBC    Component Value Date/Time   WBC 4.4 09/24/2023 0440   RBC 2.83 (L) 09/24/2023 0440   HGB 8.8 (L) 09/24/2023 0440   HGB 10.6 (L) 10/19/2021 1641   HCT 25.9 (L) 09/24/2023 0440   HCT 32.7 (L) 10/19/2021 1641   PLT 155 09/24/2023 0440   PLT 220 10/19/2021 1641   MCV 91.5 09/24/2023 0440   MCV 88 10/19/2021 1641   MCH 31.1 09/24/2023 0440   MCHC 34.0 09/24/2023 0440   RDW 17.2 (H) 09/24/2023 0440   RDW 13.5 10/19/2021 1641   LYMPHSABS 1.0 09/22/2023 1539   MONOABS 0.5 09/22/2023 1539   EOSABS 0.1 09/22/2023 1539   BASOSABS 0.0 09/22/2023 1539    CMP     Component Value Date/Time   NA 140 09/24/2023 0440   NA 136 10/24/2021 1553   K 3.6 09/24/2023 0440   CL 112 (H) 09/24/2023 0440   CO2 24 09/24/2023 0440   GLUCOSE 112 (H) 09/24/2023 0440   BUN 20 09/24/2023 0440   BUN 31 (H) 10/24/2021 1553   CREATININE 1.75 (H) 09/24/2023 0440   CALCIUM 7.6 (L) 09/24/2023 0440   PROT 5.1 (L) 09/17/2023 1715   PROT 6.6 10/19/2021 1641   ALBUMIN 3.0 (L) 09/17/2023 1715   ALBUMIN 4.6 10/19/2021 1641   AST 31 09/17/2023 1715   ALT 34 09/17/2023 1715   ALKPHOS 45 09/17/2023 1715   BILITOT 0.5 09/17/2023 1715   BILITOT 0.3 10/19/2021 1641   GFRNONAA 44 (L) 09/24/2023 0440     Assessment/Plan:    Symptomatic anemia Inpatient evaluation March 2025 with EGD, small  bowel enteroscopy, video capsule endoscopy, Meckel scan, CTA, all negative for active bleed.  Received 6 units PRBCs while inpatient.  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  Recent Hgb stable 8.8.  No overt bleeding.  No GERD. -- Needs repeat EGD to evaluate healing of erosive esophagitis - Schedule EGD/colonoscopy for further evaluation of anemia - I thoroughly discussed the procedure with the patient (at bedside) to include nature of the procedure, alternatives, benefits, and risks (including but not limited to bleeding, infection, perforation, anesthesia/cardiac pulmonary complications).  Patient verbalized understanding and gave verbal consent to proceed with procedure. 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 Recurrent pancreatitis with pseudocysts in 2019 /  chronic calcific pancreatitis with pancreatic insufficiency.   - Continue Creon 36,000 2 capsules with meals 1 capsule with snacks  History of colon polyps.  4 subcentimeter polyps including a TVA in Aug 2023. - Will evaluate with repeat colonoscopy   This visit required 46 minutes of patient care (this includes precharting, chart review, review of results, face-to-face time used for counseling as well as treatment plan and follow-up. The patient was provided an opportunity to ask questions and all were answered. The patient agreed with the plan and demonstrated an understanding of the instructions.   Suzanna Erp, PA-C [facility] 10/16/2023, 2:16 PM  Cc: Toepfer, Olivia, PA-C  I have reviewed the clinic note as outlined by Suzanna Erp and agree  with the assessment, plan and medical decision making.  Mr. Coats was previously admitted to [facility] with melena, posthemorrhagic anemia.  He underwent extensive evaluation as outlined above without source of bleeding identified.  Required transfusion of 6 to 7 units of PRBCs.  Did have LA grade C esophagitis on EGD and is due for follow-up EGD to confirm healing.  Also due for surveillance colonoscopy for history of TVA in August 2023.  Agree with performing EGD and colonoscopy as well as updated labs.  History of chronic pancreatitis has been stable and will continue Creon.  Eugenia Hess, MD

## 2023-11-02 NOTE — Addendum Note (Signed)
 Encounter addended by: Alexandro Ide on: 11/02/2023 1:57 PM  Actions taken: Imaging Exam ended, Charge Capture section accepted

## 2023-11-12 NOTE — Progress Notes (Unsigned)
 [facility] History and Physical   Primary Care Physician:  Isabelle Maple, PA-C   Reason for Procedure:  History of anemia, melena, LA grade C esophagitis, colon tubular adenomas  Plan:    Upper endoscopy and colonoscopy     HPI: Allen Russell is a 63 y.o. male undergoing upper endoscopy and colonoscopy for evaluation of anemia, melena, LA grade C esophagitis and colon tubular adenomas.  Allen Russell was admitted to the hospital in March 2025 with anemia, melena and hypotension.  Upper endoscopy showed LA grade C esophagitis and nonbleeding gastric varices.  Small bowel enteroscopy was normal.  Video capsule endoscopy showed possible Meckel's diverticulum with negative follow-up Meckel scan.  Since that time his bleeding has resolved.  Most recent hemoglobin was 13.5. Colonoscopy performed in 2023 showed 4 colon polyps which were tubular adenomas as well as a tubulovillous adenoma.    Past Medical History:  Diagnosis Date   'Light-for-dates' infant with signs of fetal malnutrition 04/12/2021   Adrenal insufficiency (HCC) 04/12/2021   Chronic kidney disease    Per pt, fluctuates between Stage IIIB and IV from April 2023.   Diabetes mellitus without complication (HCC)    Ketoacidosis 03/21/2021   Shock (HCC)     Past Surgical History:  Procedure Laterality Date   APPENDECTOMY     BACK SURGERY     CHOLECYSTECTOMY     ENTEROSCOPY N/A 09/21/2023   Procedure: ENTEROSCOPY;  Surgeon: Truddie Furrow, MD;  Location: [facility] ENDOSCOPY;  Service: Gastroenterology;  Laterality: N/A;   ESOPHAGOGASTRODUODENOSCOPY N/A 09/19/2023   Procedure: EGD (ESOPHAGOGASTRODUODENOSCOPY);  Surgeon: Truddie Furrow, MD;  Location: [facility] ENDOSCOPY;  Service: Gastroenterology;  Laterality: N/A;   GIVENS CAPSULE STUDY N/A 09/21/2023   Procedure: IMAGING PROCEDURE, GI TRACT, INTRALUMINAL, VIA CAPSULE;  Surgeon: Truddie Furrow, MD;  Location: [facility] ENDOSCOPY;  Service: Gastroenterology;  Laterality: N/A;    HERNIA REPAIR      Prior to Admission medications   Medication Sig Start Date End Date Taking? Authorizing Provider  BAQSIMI ONE PACK 3 MG/DOSE POWD Place 1 spray into the nose once. 06/29/23   [provider]  Blood Glucose Monitoring Suppl (TRUE METRIX METER) w/Device KIT Use up to four times daily as directed. 04/07/21   Arrien, Mauricio Daniel, MD  Cholecalciferol (VITAMIN D3) 75 MCG (3000 UT) TABS Take 3,000 Units by mouth daily.    [provider]  DESCOVY 200-25 MG tablet Take 1 tablet by mouth daily. 06/12/22   [provider]  diazepam (VALIUM) 2 MG tablet Take 2 mg by mouth at bedtime as needed (for leg cramps). 08/25/22   [provider]  ferrous sulfate  325 (65 FE) MG tablet Take 1 tablet (325 mg total) by mouth 2 (two) times daily. 09/24/23 10/24/23  Singh, Prashant K, MD  folic acid  (FOLVITE ) 1 MG tablet Take 1 tablet (1 mg total) by mouth daily. 09/24/23   Singh, Prashant K, MD  glucose blood (TRUE METRIX BLOOD GLUCOSE TEST) test strip Use as directed to test blood sugar 08/11/21   Vita Grip, MD  insulin  aspart (NOVOLOG ) 100 UNIT/ML injection Inject 0-5 Units into the skin 2 (two) times daily as needed (for BG greater than 180). 09/12/23 09/11/24  [provider]  Insulin  Pen Needle 32G X 4 MM MISC Use 1 as directed in the morning, at noon, in the evening, and at bedtime (4 times a day) with insulin  05/30/21   Toy Freund, MD  lipase/protease/amylase (CREON ) 36000 UNITS CPEP capsule  Take 1 capsule (36,000 Units total) by mouth 3 (three) times daily with meals. Patient taking differently: Take 72,000 Units by mouth 3 (three) times daily with meals. Take 2 capsules by mouth three times a day with meals and 1 capsule with snack 09/16/21   Amponsah, Prosper M, MD  midodrine  (PROAMATINE ) 10 MG tablet Take 0.5 tablets (5 mg total) by mouth 2 (two) times daily with a meal. Patient not taking: Reported on 10/16/2023 09/24/23   Singh, Prashant  K, MD  Multiple Vitamin (MULTIVITAMIN) tablet Take 1 tablet by mouth daily.    [provider]  pantoprazole  (PROTONIX ) 40 MG tablet Take 1 tablet (40 mg total) by mouth 2 (two) times daily before a meal. 09/24/23   Singh, Prashant K, MD  tadalafil (CIALIS) 20 MG tablet Take 20 mg by mouth as needed for erectile dysfunction.    [provider]  traZODone  (DESYREL ) 50 MG tablet Take 1 tablet by mouth daily.    [provider]  TRESIBA FLEXTOUCH 100 UNIT/ML FlexTouch Pen INJECT 12 AT BEDTIME 09/12/23   [provider]  TRUEplus Lancets 28G MISC Use as directed to test blood sugar 06/28/21   Freada Jacobs, MD    Current Outpatient Medications  Medication Sig Dispense Refill   Cholecalciferol (VITAMIN D3) 75 MCG (3000 UT) TABS Take 3,000 Units by mouth daily.     Continuous Glucose Sensor (DEXCOM G7 SENSOR) MISC SMARTSIG:Every 10 Days     cyclobenzaprine  (FLEXERIL ) 10 MG tablet TAKE 1 TABLET BY MOUTH AS NEEDED AT BEDTIME FOR MUSCLE SPASM     DESCOVY 200-25 MG tablet Take 1 tablet by mouth daily.     diazepam (VALIUM) 2 MG tablet Take 2 mg by mouth at bedtime as needed (for leg cramps).     glucose blood (TRUE METRIX BLOOD GLUCOSE TEST) test strip Use as directed to test blood sugar 200 each 2   insulin  aspart (NOVOLOG ) 100 UNIT/ML injection Inject 0-5 Units into the skin 2 (two) times daily as needed (for BG greater than 180).     Insulin  Pen Needle 32G X 4 MM MISC Use 1 as directed in the morning, at noon, in the evening, and at bedtime (4 times a day) with insulin  100 each 3   lipase/protease/amylase (CREON ) 36000 UNITS CPEP capsule Take 1 capsule (36,000 Units total) by mouth 3 (three) times daily with meals. (Patient taking differently: Take 72,000 Units by mouth 3 (three) times daily with meals. Take 2 capsules by mouth three times a day with meals and 1 capsule with snack) 100 capsule 5   Multiple Vitamin (MULTIVITAMIN) tablet Take 1 tablet by mouth daily.      NOVOLOG  FLEXPEN 100 UNIT/ML FlexPen SMARTSIG:2-3 Unit(s) SUB-Q Twice Daily PRN     traZODone  (DESYREL ) 50 MG tablet Take 1 tablet by mouth daily.     TRESIBA FLEXTOUCH 100 UNIT/ML FlexTouch Pen INJECT 12 AT BEDTIME     TRUEplus Lancets 28G MISC Use as directed to test blood sugar 100 each 0   BAQSIMI ONE PACK 3 MG/DOSE POWD Place 1 spray into the nose once.     Blood Glucose Monitoring Suppl (TRUE METRIX METER) w/Device KIT Use up to four times daily as directed. 1 kit 0   NOVOLOG  FLEXPEN 100 UNIT/ML FlexPen INJECT 2 TO 3 UNITS SUBCUTANEOUSLY AS NEEDED TWICE DAILY FOR BG GREATER THAN 180. MAX DAILY DOSE IS 6 UNITS     tadalafil (CIALIS) 20 MG tablet Take 20 mg by mouth as needed  for erectile dysfunction.     Current Facility-Administered Medications  Medication Dose Route Frequency Provider Last Rate Last Admin   0.9 %  sodium chloride  infusion  500 mL Intravenous Once Teodora Baumgarten, Scarlette Currier, MD        Allergies as of 11/13/2023 - Review Complete 11/13/2023  Allergen Reaction Noted   Morphine Other (See Comments) 05/16/2011    Family History  Problem Relation Age of Onset   Breast cancer Mother    Thyroid disease Mother    Breast cancer Sister    Pancreatic cancer Maternal Grandmother    Ovarian cancer Maternal Grandmother    Hypertension Paternal Grandfather     Social History   Socioeconomic History   Marital status: Single    Spouse name: Not on file   Number of children: Not on file   Years of education: Not on file   Highest education level: Bachelor's degree (e.g., BA, AB, BS)  Occupational History   Not on file  Tobacco Use   Smoking status: Never   Smokeless tobacco: Never  Vaping Use   Vaping status: Never Used  Substance and Sexual Activity   Alcohol use: Not Currently    Comment: last drink in 2020   Drug use: Not Currently    Comment: 1998 ectasy   Sexual activity: Yes    Partners: Male    Birth control/protection: None  Other Topics Concern   Not on file   Social History Narrative   Not on file   Social Drivers of Health   Financial Resource Strain: Not on file  Food Insecurity: Food Insecurity Present (09/18/2023)   Hunger Vital Sign    Worried About Running Out of Food in the Last Year: Sometimes true    Ran Out of Food in the Last Year: Sometimes true  Transportation Needs: No Transportation Needs (09/18/2023)   PRAPARE - Administrator, Civil Service (Medical): No    Lack of Transportation (Non-Medical): No  Physical Activity: Not on file  Stress: Not on file  Social Connections: Not on file  Intimate Partner Violence: Not At Risk (09/18/2023)   Humiliation, Afraid, Rape, and Kick questionnaire    Fear of Current or Ex-Partner: No    Emotionally Abused: No    Physically Abused: No    Sexually Abused: No    Review of Systems:  All other review of systems negative except as mentioned in the HPI.  Physical Exam: Vital signs BP 118/78   Pulse 67   Temp 98.5 F (36.9 C)   Resp 11   Ht 6\' 4"  (1.93 m)   Wt 219 lb (99.3 kg)   SpO2 100%   BMI 26.66 kg/m   General:   Alert,  Well-developed, well-nourished, pleasant and cooperative in NAD Airway:  Mallampati 1 Lungs:  Clear throughout to auscultation.   Heart:  Regular rate and rhythm; no murmurs, clicks, rubs,  or gallops. Abdomen:  Soft, nontender and nondistended. Normal bowel sounds.   Neuro/Psych:  Normal mood and affect. A and O x 3  Eugenia Hess, MD [facility]

## 2023-11-13 ENCOUNTER — Encounter: Payer: Self-pay | Admitting: Pediatrics

## 2023-11-13 ENCOUNTER — Ambulatory Visit: Admitting: Pediatrics

## 2023-11-13 VITALS — BP 122/76 | HR 51 | Temp 98.5°F | Resp 13 | Ht 76.0 in | Wt 219.0 lb

## 2023-11-13 DIAGNOSIS — K922 Gastrointestinal hemorrhage, unspecified: Secondary | ICD-10-CM

## 2023-11-13 DIAGNOSIS — Z860101 Personal history of adenomatous and serrated colon polyps: Secondary | ICD-10-CM

## 2023-11-13 DIAGNOSIS — Z1211 Encounter for screening for malignant neoplasm of colon: Secondary | ICD-10-CM | POA: Diagnosis not present

## 2023-11-13 DIAGNOSIS — D122 Benign neoplasm of ascending colon: Secondary | ICD-10-CM | POA: Diagnosis not present

## 2023-11-13 DIAGNOSIS — D509 Iron deficiency anemia, unspecified: Secondary | ICD-10-CM

## 2023-11-13 DIAGNOSIS — D120 Benign neoplasm of cecum: Secondary | ICD-10-CM

## 2023-11-13 DIAGNOSIS — K208 Other esophagitis without bleeding: Secondary | ICD-10-CM | POA: Diagnosis not present

## 2023-11-13 DIAGNOSIS — K648 Other hemorrhoids: Secondary | ICD-10-CM

## 2023-11-13 DIAGNOSIS — K921 Melena: Secondary | ICD-10-CM

## 2023-11-13 DIAGNOSIS — I864 Gastric varices: Secondary | ICD-10-CM

## 2023-11-13 DIAGNOSIS — D123 Benign neoplasm of transverse colon: Secondary | ICD-10-CM

## 2023-11-13 DIAGNOSIS — K209 Esophagitis, unspecified without bleeding: Secondary | ICD-10-CM

## 2023-11-13 MED ORDER — PANTOPRAZOLE SODIUM 40 MG PO TBEC
40.0000 mg | DELAYED_RELEASE_TABLET | Freq: Two times a day (BID) | ORAL | 3 refills | Status: AC
Start: 2023-11-13 — End: ?

## 2023-11-13 MED ORDER — SUCRALFATE 1 G PO TABS: 1.0000 g | ORAL_TABLET | Freq: Three times a day (TID) | ORAL | 0 refills | Status: DC

## 2023-11-13 MED ORDER — SODIUM CHLORIDE 0.9 % IV SOLN: 500.0000 mL | Freq: Once | INTRAVENOUS | Status: DC

## 2023-11-13 NOTE — Op Note (Signed)
 [facility] Patient Name: Allen Russell Procedure Date: 11/13/2023 2:36 PM MRN: [id number] Endoscopist: Eugenia Hess , MD, [id number] Age: 63 Referring MD:  Date of Birth: [birth year] Gender: Male Account #: [id number] Procedure:                Upper GI endoscopy Indications:              Melena, Follow-up of esophagitis Medicines:                Monitored Anesthesia Care Procedure:                Pre-Anesthesia Assessment:                           - Prior to the procedure, a History and Physical                            was performed, and patient medications and                            allergies were reviewed. The patient's tolerance of                            previous anesthesia was also reviewed. The risks                            and benefits of the procedure and the sedation                            options and risks were discussed with the patient.                            All questions were answered, and informed consent                            was obtained. Prior Anticoagulants: The patient has                            taken no anticoagulant or antiplatelet agents. ASA                            Grade Assessment: III - A patient with severe                            systemic disease. After reviewing the risks and                            benefits, the patient was deemed in satisfactory                            condition to undergo the procedure.                           After obtaining informed consent, the endoscope was  passed under direct vision. Throughout the                            procedure, the patient's blood pressure, pulse, and                            oxygen saturations were monitored continuously. The                            Olympus scope [id number] was introduced through the                            mouth, and advanced to the second part of duodenum.                            The upper GI  endoscopy was accomplished without                            difficulty. The patient tolerated the procedure                            well. Scope In: Scope Out: Findings:                 The upper third of the esophagus and middle third                            of the esophagus were normal.                           LA Grade C (one or more mucosal breaks continuous                            between tops of 2 or more mucosal folds, less than                            75% circumference) esophagitis was found. Biopsies                            were taken with a cold forceps for histology.                           The gastric body, gastric antrum, cardia (on                            retroflexion) and gastric fundus (on retroflexion)                            were normal.                           Varices with no bleeding were found in the gastric                            cardia and  appeared stable.                           The stomach was angulated in the region of the                            antrum/prepyloric region.                           The duodenal bulb and second portion of the                            duodenum were normal. Complications:            No immediate complications. Estimated blood loss:                            Minimal. Estimated Blood Loss:     Estimated blood loss was minimal. Impression:               - Normal upper third of esophagus and middle third                            of esophagus.                           - LA Grade C erosive esophagitis. Biopsied.                           - Normal gastric body, antrum, cardia and gastric                            fundus.                           - Gastric varices, without bleeding.                           - Normal duodenal bulb and second portion of the                            duodenum. Recommendation:           - Await pathology results.                           - Increase PPI to twice  daily dosing?"prescribed                            pantoprazole  40 mg p.o. twice daily 20 to 30                            minutes before meal                           - Start Carafate slurry 1 g p.o. 4 times daily x 1  month to coat esophagus                           - Repeat EGD in 2 to 3 months for confirmation of                            healing of esophagitis                           - Limit exposure to NSAIDs                           - Perform a colonoscopy today                           - Discussed with patient's family Eugenia Hess, MD 11/13/2023 3:30:26 PM This report has been signed electronically.

## 2023-11-13 NOTE — Progress Notes (Signed)
 Called to room to assist during endoscopic procedure.  Patient ID and intended procedure confirmed with present staff. Received instructions for my participation in the procedure from the performing physician.

## 2023-11-13 NOTE — Progress Notes (Signed)
 D5W hanging for BS 51. Recheck in 15 minutes 62.  1/2 Amp 25 given. Patient Alert oriented. Skin warm and dry.

## 2023-11-13 NOTE — Progress Notes (Signed)
 Recheck BS 100.

## 2023-11-13 NOTE — Progress Notes (Signed)
 Pt's states no medical or surgical changes since previsit or office visit.

## 2023-11-13 NOTE — Patient Instructions (Addendum)
 Resume previous diet Continue present medications New Rx's for carafate and protonix  twice daily 20-30 mins before meals Minimize use of  NSAIDS (Non-Steroidal anti-inflammatory drugs).  (These include, aspirin, aspirin-containing products(products containing salicylic acid like Pepto Bismol and Alka Seltzer), ibuprofen, advil, motrin, naproxen, aleve, goody powders, etc)  Await pathology results Egd in 2-3 months July 22nd, previsit June 18th   Handouts/information given for esophageal varices, esophagitis,  polyps, and hemorrhoids  YOU HAD AN ENDOSCOPIC PROCEDURE TODAY AT THE [facility]:   Refer to the procedure report that was given to you for any specific questions about what was found during the examination.  If the procedure report does not answer your questions, please call your gastroenterologist to clarify.  If you requested that your care partner not be given the details of your procedure findings, then the procedure report has been included in a sealed envelope for you to review at your convenience later.  YOU SHOULD EXPECT: Some feelings of bloating in the abdomen. Passage of more gas than usual.  Walking can help get rid of the air that was put into your GI tract during the procedure and reduce the bloating. If you had a lower endoscopy (such as a colonoscopy or flexible sigmoidoscopy) you may notice spotting of blood in your stool or on the toilet paper. If you underwent a bowel prep for your procedure, you may not have a normal bowel movement for a few days.  Please Note:  You might notice some irritation and congestion in your nose or some drainage.  This is from the oxygen used during your procedure.  There is no need for concern and it should clear up in a day or so.  SYMPTOMS TO REPORT IMMEDIATELY:  Following lower endoscopy (colonoscopy):  Excessive amounts of blood in the stool  Significant tenderness or worsening of abdominal pains  Swelling of the abdomen  that is new, acute  Fever of 100F or higher Following upper endoscopy (EGD)  Vomiting of blood or coffee ground material  New chest pain or pain under the shoulder blades  Painful or persistently difficult swallowing  New shortness of breath  Black, tarry-looking stools For urgent or emergent issues, a gastroenterologist can be reached at any hour by calling [phone]. Do not use MyChart messaging for urgent concerns.   DIET:  We do recommend a small meal at first, but then you may proceed to your regular diet.  Drink plenty of fluids but you should avoid alcoholic beverages for 24 hours.  ACTIVITY:  You should plan to take it easy for the rest of today and you should NOT DRIVE or use heavy machinery until tomorrow (because of the sedation medicines used during the test).    FOLLOW UP: Our staff will call the number listed on your records the next business day following your procedure.  We will call around 7:15- 8:00 am to check on you and address any questions or concerns that you may have regarding the information given to you following your procedure. If we do not reach you, we will leave a message.     If any biopsies were taken you will be contacted by phone or by letter within the next 1-3 weeks.  Please call us  at [phone] if you have not heard about the biopsies in 3 weeks.    SIGNATURES/CONFIDENTIALITY: You and/or your care partner have signed paperwork which will be entered into your electronic medical record.  These signatures attest to the fact  that that the information above on your After Visit Summary has been reviewed and is understood.  Full responsibility of the confidentiality of this discharge information lies with you and/or your care-partner.

## 2023-11-13 NOTE — Op Note (Signed)
 [facility] Patient Name: Allen Russell Procedure Date: 11/13/2023 2:24 PM MRN: [id number] Endoscopist: Eugenia Hess , MD, [id number] Age: 63 Referring MD:  Date of Birth: Oct 29, 1960 Gender: Male Account #: [id number] Procedure:                Colonoscopy Indications:              High risk colon cancer surveillance: Personal                            history of adenoma with villous component, High                            risk colon cancer surveillance: Personal history of                            multiple (3 or more) adenomas; incidental history                            of recent hospitalization for anemia and melena;                            Last colonoscopy performed in 2023 Medicines:                Monitored Anesthesia Care Procedure:                Pre-Anesthesia Assessment:                           - Prior to the procedure, a History and Physical                            was performed, and patient medications and                            allergies were reviewed. The patient's tolerance of                            previous anesthesia was also reviewed. The risks                            and benefits of the procedure and the sedation                            options and risks were discussed with the patient.                            All questions were answered, and informed consent                            was obtained. Prior Anticoagulants: The patient has                            taken no anticoagulant or antiplatelet agents. ASA  Grade Assessment: III - A patient with severe                            systemic disease. After reviewing the risks and                            benefits, the patient was deemed in satisfactory                            condition to undergo the procedure.                           After obtaining informed consent, the colonoscope                            was passed under direct  vision. Throughout the                            procedure, the patient's blood pressure, pulse, and                            oxygen saturations were monitored continuously. The                            Olympus CF-HQ190L ([id number]) Colonoscope was                            introduced through the anus and advanced to the                            cecum, identified by appendiceal orifice and                            ileocecal valve. The colonoscopy was performed                            without difficulty. The patient tolerated the                            procedure well. The quality of the bowel                            preparation was adequate to identify polyps greater                            than 5 mm in size. The ileocecal valve, appendiceal                            orifice, and rectum were photographed. Scope In: 2:54:28 PM Scope Out: 3:24:52 PM Scope Withdrawal Time: 0 hours 22 minutes 31 seconds  Total Procedure Duration: 0 hours 30 minutes 24 seconds  Findings:                 The perianal and digital rectal examinations were  normal. Pertinent negatives include normal                            sphincter tone and no palpable rectal lesions.                           Five sessile polyps were found in the transverse                            colon, ascending colon and cecum. The polyps were 5                            to 7 mm in size. These polyps were removed with a                            cold snare. Resection and retrieval were complete.                           A moderate amount of semi-liquid stool was found in                            the entire colon. Lavage of the area was performed                            using a large amount of sterile water, resulting in                            clearance with adequate visualization. Views of the                            right colon were limited at times due to colon                             spasm.                           Internal hemorrhoids were found during retroflexion. Complications:            No immediate complications. Estimated blood loss:                            Minimal. Estimated Blood Loss:     Estimated blood loss was minimal. Impression:               - Five 5 to 7 mm polyps in the transverse colon, in                            the ascending colon and in the cecum, removed with                            a cold snare. Resected and retrieved.                           -  Stool in the entire examined colon. Lavage was                            performed with overall adequate visualization.                           - Internal hemorrhoids. Recommendation:           - Discharge patient to home (ambulatory).                           - Await pathology results.                           - Repeat colonoscopy for surveillance based on                            pathology results.                           - The findings and recommendations were discussed                            with the patient's family.                           - Patient has a contact number available for                            emergencies. The signs and symptoms of potential                            delayed complications were discussed with the                            patient. Return to normal activities tomorrow.                            Written discharge instructions were provided to the                            patient. Eugenia Hess, MD 11/13/2023 3:35:59 PM This report has been signed electronically.

## 2023-11-13 NOTE — Progress Notes (Signed)
 Pt A/O x 3, gd SR's, pleased with anesthesia, report to RN

## 2023-11-14 ENCOUNTER — Telehealth: Payer: Self-pay | Admitting: *Deleted

## 2023-11-14 NOTE — Telephone Encounter (Signed)
 Left message on f/u call

## 2023-11-19 ENCOUNTER — Telehealth: Payer: Self-pay | Admitting: Pediatrics

## 2023-11-19 LAB — SURGICAL PATHOLOGY

## 2023-11-19 NOTE — Telephone Encounter (Signed)
 Called and spoke with patient. Patient reports that he has had generalized abdominal pain since his colonoscopy on 11/13/23. Patient reports that the intensity of the pain varies. Patient has had several loose stools since the procedure. Pain does not improve after a BM. Patient is passing gas, has some bloating. Abdomen is tender to touch as well. Denies any nausea, vomiting, or fever. Patient has not tried Tylenol  or heating pad. Report noted that patient had colon spasm during colonoscopy, not sure if discomfort is residual or not. Please advise in Dr. Jarold Merlin absence. Thanks

## 2023-11-19 NOTE — Telephone Encounter (Signed)
 Patient called and stated that since he had a colonoscopy procedure his abdomin is still very tender. Patient is wondering if that was normal. Patient is requesting a call back. Please advise.

## 2023-11-20 MED ORDER — DICYCLOMINE HCL 10 MG PO CAPS: 10.0000 mg | ORAL_CAPSULE | Freq: Two times a day (BID) | ORAL | 0 refills | Status: DC | PRN

## 2023-11-20 NOTE — Telephone Encounter (Signed)
 RX for Dicyclomine sent to pharmacy on file. MyChart message sent to patient with recommendations.

## 2023-11-20 NOTE — Addendum Note (Signed)
 Addended by: Kennie Snedden N on: 11/20/2023 08:35 AM   Modules accepted: Orders

## 2023-11-21 ENCOUNTER — Ambulatory Visit: Payer: Self-pay | Admitting: Pediatrics

## 2023-12-18 ENCOUNTER — Ambulatory Visit (AMBULATORY_SURGERY_CENTER): Admitting: *Deleted

## 2023-12-18 ENCOUNTER — Telehealth: Payer: Self-pay | Admitting: *Deleted

## 2023-12-18 VITALS — Ht 76.0 in | Wt 210.0 lb

## 2023-12-18 DIAGNOSIS — K861 Other chronic pancreatitis: Secondary | ICD-10-CM

## 2023-12-18 DIAGNOSIS — D509 Iron deficiency anemia, unspecified: Secondary | ICD-10-CM

## 2023-12-18 DIAGNOSIS — K2210 Ulcer of esophagus without bleeding: Secondary | ICD-10-CM

## 2023-12-18 DIAGNOSIS — K8689 Other specified diseases of pancreas: Secondary | ICD-10-CM

## 2023-12-18 NOTE — Progress Notes (Signed)
.  Pt's name and DOB verified at the beginning of the pre-visit wit 2 identifiers  Pt denies any difficulty with ambulating,sitting, laying down or rolling side to side  Pt has no issues moving head neck or swallowing  No egg or soy allergy known to patient   No issues known to pt with past sedation with any surgeries or procedures  Patient denies ever being intubated  No FH of Malignant Hyperthermia  Pt is not on home 02   Pt is not on blood thinners   Pt denies issues with constipation   Pt has frequent issues with constipation RN instructed pt to use Miralax  per bottles instructions a week before prep days. Pt states they will  Pt is not on dialysis  Pt denise any abnormal heart rhythms   Pt denies any upcoming cardiac testing  Chart not reviewed by CRNA prior to PV  Visit by phone  Pt states weight is 210 lb   IInstructions reviewed. Pt given  both [facility] main # and MD on call # prior to instructions.  Pt states understanding of instructions. Instructed pt to review instructions again prior to procedure and call main # given if has questions.. Pt states they will.   Instructed pt on where to find instructions on My Chart.

## 2023-12-18 NOTE — Telephone Encounter (Signed)
 Pt agreed to do his PV today.

## 2023-12-19 ENCOUNTER — Encounter

## 2024-01-21 NOTE — Progress Notes (Signed)
  Gastroenterology History and Physical   Primary Care Physician:  Linde Bitters, PA-C   Reason for Procedure:  Follow-up of LA grade C esophagitis on EGD 11/2023  Plan:    EGD     HPI: Allen Russell is a 63 y.o. male undergoing EGD for follow-up of a history of erosive esophagitis.  Patient underwent EGD 09/2023 in the setting of melena and acute posthemorrhagic anemia.This showed LA grade C esophagitis.  Patient was advised to take PPI twice daily.  Follow-up EGD 11/2023 showed persistent LA grade C esophagitis.  Patient was not taking PPI twice daily as advised.  Reinforced the importance of taking twice daily PPI and adding Carafate .  Patient has generally been asymptomatic throughout course.  Past Medical History:  Diagnosis Date   'Light-for-dates' infant with signs of fetal malnutrition 04/12/2021   Adrenal insufficiency (HCC) 04/12/2021   Chronic kidney disease    Per pt, fluctuates between Stage IIIB and IV from April 2023.   Diabetes mellitus without complication (HCC)    Ketoacidosis 03/21/2021   Shock (HCC)     Past Surgical History:  Procedure Laterality Date   APPENDECTOMY     BACK SURGERY     CHOLECYSTECTOMY     ENTEROSCOPY N/A 09/21/2023   Procedure: ENTEROSCOPY;  Surgeon: Suzann Inocente HERO, MD;  Location: [facility] ENDOSCOPY;  Service: Gastroenterology;  Laterality: N/A;   ESOPHAGOGASTRODUODENOSCOPY N/A 09/19/2023   Procedure: EGD (ESOPHAGOGASTRODUODENOSCOPY);  Surgeon: Suzann Inocente HERO, MD;  Location: [facility] ENDOSCOPY;  Service: Gastroenterology;  Laterality: N/A;   GIVENS CAPSULE STUDY N/A 09/21/2023   Procedure: IMAGING PROCEDURE, GI TRACT, INTRALUMINAL, VIA CAPSULE;  Surgeon: Suzann Inocente HERO, MD;  Location: [facility] ENDOSCOPY;  Service: Gastroenterology;  Laterality: N/A;   HERNIA REPAIR      Prior to Admission medications   Medication Sig Start Date End Date Taking? Authorizing Provider  BAQSIMI ONE PACK 3 MG/DOSE POWD Place 1 spray into the nose once. 06/29/23    [provider]  Blood Glucose Monitoring Suppl (TRUE METRIX METER) w/Device KIT Use up to four times daily as directed. 04/07/21   Arrien, Mauricio Daniel, MD  Cholecalciferol (VITAMIN D3) 75 MCG (3000 UT) TABS Take 3,000 Units by mouth daily.    [provider]  Continuous Glucose Sensor (DEXCOM G7 SENSOR) MISC SMARTSIG:Every 10 Days 10/21/23   [provider]  cyclobenzaprine  (FLEXERIL ) 10 MG tablet TAKE 1 TABLET BY MOUTH AS NEEDED AT BEDTIME FOR MUSCLE SPASM    [provider]  DESCOVY 200-25 MG tablet Take 1 tablet by mouth daily. 06/12/22   [provider]  diazepam (VALIUM) 2 MG tablet Take 2 mg by mouth at bedtime as needed (for leg cramps). 08/25/22   [provider]  dicyclomine  (BENTYL ) 10 MG capsule Take 1 capsule (10 mg total) by mouth 2 (two) times daily as needed (abdominal discomfort). 11/20/23   May, Deanna J, NP  doxycycline (ADOXA) 100 MG tablet Take by mouth. 11/15/23   [provider]  glucose blood (TRUE METRIX BLOOD GLUCOSE TEST) test strip Use as directed to test blood sugar 08/11/21   Lou Claretta HERO, MD  insulin  aspart (NOVOLOG ) 100 UNIT/ML injection Inject 0-5 Units into the skin 2 (two) times daily as needed (for BG greater than 180). 09/12/23 09/11/24  [provider]  Insulin  Pen Needle 32G X 4 MM MISC Use 1 as directed in the morning, at noon, in the evening, and at bedtime (4 times a day) with insulin  05/30/21   Braswell,  Manus, MD  lipase/protease/amylase (CREON ) 36000 UNITS CPEP capsule Take 1 capsule (36,000 Units total) by mouth 3 (three) times daily with meals. 09/16/21   Amponsah, Prosper M, MD  Multiple Vitamin (MULTIVITAMIN) tablet Take 1 tablet by mouth daily.    [provider]  NOVOLOG  FLEXPEN 100 UNIT/ML FlexPen INJECT 2 TO 3 UNITS SUBCUTANEOUSLY AS NEEDED TWICE DAILY FOR BG GREATER THAN 180. MAX DAILY DOSE IS 6 UNITS    [provider]  NOVOLOG  FLEXPEN 100 UNIT/ML FlexPen  SMARTSIG:2-3 Unit(s) SUB-Q Twice Daily PRN 11/04/23   [provider]  pantoprazole  (PROTONIX ) 40 MG tablet Take 1 tablet (40 mg total) by mouth 2 (two) times daily before a meal. 11/13/23   Zaivion Kundrat, Inocente HERO, MD  sucralfate  (CARAFATE ) 1 g tablet Take 1 tablet (1 g total) by mouth 4 (four) times daily -  with meals and at bedtime. Add water to make a slurry with the tablet and swallow 11/13/23   Cristan Hout, Inocente HERO, MD  tadalafil (CIALIS) 20 MG tablet Take 20 mg by mouth as needed for erectile dysfunction.    [provider]  traZODone  (DESYREL ) 50 MG tablet Take 1 tablet by mouth daily.    [provider]  TRESIBA FLEXTOUCH 100 UNIT/ML FlexTouch Pen INJECT 12 AT BEDTIME 09/12/23   [provider]  TRUEplus Lancets 28G MISC Use as directed to test blood sugar 06/28/21   Carolynn Standing, MD    Current Outpatient Medications  Medication Sig Dispense Refill   Blood Glucose Monitoring Suppl (TRUE METRIX METER) w/Device KIT Use up to four times daily as directed. 1 kit 0   Cholecalciferol (VITAMIN D3) 75 MCG (3000 UT) TABS Take 3,000 Units by mouth daily.     Continuous Glucose Sensor (DEXCOM G7 SENSOR) MISC SMARTSIG:Every 10 Days     cyclobenzaprine  (FLEXERIL ) 10 MG tablet TAKE 1 TABLET BY MOUTH AS NEEDED AT BEDTIME FOR MUSCLE SPASM     DESCOVY 200-25 MG tablet Take 1 tablet by mouth daily.     diazepam (VALIUM) 2 MG tablet Take 2 mg by mouth at bedtime as needed (for leg cramps).     doxycycline (ADOXA) 100 MG tablet Take by mouth.     glucose blood (TRUE METRIX BLOOD GLUCOSE TEST) test strip Use as directed to test blood sugar 200 each 2   insulin  aspart (NOVOLOG ) 100 UNIT/ML injection Inject 0-5 Units into the skin 2 (two) times daily as needed (for BG greater than 180).     Insulin  Pen Needle 32G X 4 MM MISC Use 1 as directed in the morning, at noon, in the evening, and at bedtime (4 times a day) with insulin  100 each 3   lipase/protease/amylase (CREON ) 36000 UNITS  CPEP capsule Take 1 capsule (36,000 Units total) by mouth 3 (three) times daily with meals. 100 capsule 5   Multiple Vitamin (MULTIVITAMIN) tablet Take 1 tablet by mouth daily.     NOVOLOG  FLEXPEN 100 UNIT/ML FlexPen INJECT 2 TO 3 UNITS SUBCUTANEOUSLY AS NEEDED TWICE DAILY FOR BG GREATER THAN 180. MAX DAILY DOSE IS 6 UNITS     NOVOLOG  FLEXPEN 100 UNIT/ML FlexPen SMARTSIG:2-3 Unit(s) SUB-Q Twice Daily PRN     pantoprazole  (PROTONIX ) 40 MG tablet Take 1 tablet (40 mg total) by mouth 2 (two) times daily before a meal. 180 tablet 3   tadalafil (CIALIS) 20 MG tablet Take 20 mg by mouth as needed for erectile dysfunction.     traZODone  (DESYREL ) 50 MG tablet Take 1 tablet by  mouth daily.     TRESIBA FLEXTOUCH 100 UNIT/ML FlexTouch Pen INJECT 12 AT BEDTIME     TRUEplus Lancets 28G MISC Use as directed to test blood sugar 100 each 0   BAQSIMI ONE PACK 3 MG/DOSE POWD Place 1 spray into the nose once.     dicyclomine  (BENTYL ) 10 MG capsule Take 1 capsule (10 mg total) by mouth 2 (two) times daily as needed (abdominal discomfort). 10 capsule 0   sucralfate  (CARAFATE ) 1 g tablet Take 1 tablet (1 g total) by mouth 4 (four) times daily -  with meals and at bedtime. Add water to make a slurry with the tablet and swallow (Patient not taking: Reported on 01/22/2024) 120 tablet 0   Current Facility-Administered Medications  Medication Dose Route Frequency Provider Last Rate Last Admin   0.9 %  sodium chloride  infusion  500 mL Intravenous Once Annakate Soulier, Inocente HERO, MD        Allergies as of 01/22/2024 - Review Complete 01/22/2024  Allergen Reaction Noted   Morphine Other (See Comments) 05/16/2011    Family History  Problem Relation Age of Onset   Breast cancer Mother    Thyroid disease Mother    Breast cancer Sister    Pancreatic cancer Maternal Grandmother    Ovarian cancer Maternal Grandmother    Hypertension Paternal Grandfather    Colon polyps Neg Hx    Colon cancer Neg Hx    Esophageal cancer Neg Hx     Rectal cancer Neg Hx    Stomach cancer Neg Hx     Social History   Socioeconomic History   Marital status: Single    Spouse name: Not on file   Number of children: Not on file   Years of education: Not on file   Highest education level: Bachelor's degree (e.g., BA, AB, BS)  Occupational History   Not on file  Tobacco Use   Smoking status: Never   Smokeless tobacco: Never  Vaping Use   Vaping status: Never Used  Substance and Sexual Activity   Alcohol use: Not Currently    Comment: last drink in 2020   Drug use: Not Currently    Comment: 1998 ectasy   Sexual activity: Yes    Partners: Male    Birth control/protection: None  Other Topics Concern   Not on file  Social History Narrative   Not on file   Social Drivers of Health   Financial Resource Strain: Not on file  Food Insecurity: Low Risk  (01/14/2024)   Received from [facility]   Hunger Vital Sign    Within the past 12 months, you worried that your food would run out before you got money to buy more: Never true    Within the past 12 months, the food you bought just didn't last and you didn't have money to get more. : Never true  Transportation Needs: No Transportation Needs (01/14/2024)   Received from [facility]    In the past 12 months, has lack of reliable transportation kept you from medical appointments, meetings, work or from getting things needed for daily living? : No  Physical Activity: Not on file  Stress: Not on file  Social Connections: Not on file  Intimate Partner Violence: Not At Risk (09/18/2023)   Humiliation, Afraid, Rape, and Kick questionnaire    Fear of Current or Ex-Partner: No    Emotionally Abused: No    Physically Abused: No    Sexually Abused: No  Review of Systems:  All other review of systems negative except as mentioned in the HPI.  Physical Exam: Vital signs BP (!) 106/57   Pulse 71   Temp 98.1 F (36.7 C) (Skin)   Ht 6' 4 (1.93 m)   Wt 210 lb  (95.3 kg)   SpO2 98%   BMI 25.56 kg/m   General:   Alert,  Well-developed, well-nourished, pleasant and cooperative in NAD Airway:  Mallampati 2 Lungs:  Clear throughout to auscultation.   Heart:  Regular rate and rhythm; no murmurs, clicks, rubs,  or gallops. Abdomen:  Soft, nontender and nondistended. Normal bowel sounds.   Neuro/Psych:  Normal mood and affect. A and O x 3  Inocente Hausen, MD [facility]

## 2024-01-22 ENCOUNTER — Encounter: Payer: Self-pay | Admitting: Pediatrics

## 2024-01-22 ENCOUNTER — Ambulatory Visit (AMBULATORY_SURGERY_CENTER): Admitting: Pediatrics

## 2024-01-22 VITALS — BP 123/69 | HR 61 | Temp 98.1°F | Resp 15 | Ht 76.0 in | Wt 210.0 lb

## 2024-01-22 DIAGNOSIS — I864 Gastric varices: Secondary | ICD-10-CM | POA: Diagnosis not present

## 2024-01-22 DIAGNOSIS — K2289 Other specified disease of esophagus: Secondary | ICD-10-CM | POA: Diagnosis not present

## 2024-01-22 DIAGNOSIS — K209 Esophagitis, unspecified without bleeding: Secondary | ICD-10-CM

## 2024-01-22 DIAGNOSIS — K2270 Barrett's esophagus without dysplasia: Secondary | ICD-10-CM

## 2024-01-22 DIAGNOSIS — D509 Iron deficiency anemia, unspecified: Secondary | ICD-10-CM

## 2024-01-22 DIAGNOSIS — K2210 Ulcer of esophagus without bleeding: Secondary | ICD-10-CM

## 2024-01-22 MED ORDER — SODIUM CHLORIDE 0.9 % IV SOLN: 500.0000 mL | Freq: Once | INTRAVENOUS | Status: DC

## 2024-01-22 MED ORDER — NYSTATIN 100000 UNIT/ML MT SUSP
5.0000 mL | Freq: Four times a day (QID) | OROMUCOSAL | 0 refills | Status: AC
Start: 2024-01-22 — End: 2024-02-05

## 2024-01-22 NOTE — Patient Instructions (Signed)
 Thank you for letting us  take care of your healthcare needs today. Pickup Rx for [employer] from your pharmacy. Await pathology results.     YOU HAD AN ENDOSCOPIC PROCEDURE TODAY AT THE [facility]:   Refer to the procedure report that was given to you for any specific questions about what was found during the examination.  If the procedure report does not answer your questions, please call your gastroenterologist to clarify.  If you requested that your care partner not be given the details of your procedure findings, then the procedure report has been included in a sealed envelope for you to review at your convenience later.  YOU SHOULD EXPECT: Some feelings of bloating in the abdomen. Passage of more gas than usual.  Walking can help get rid of the air that was put into your GI tract during the procedure and reduce the bloating. If you had a lower endoscopy (such as a colonoscopy or flexible sigmoidoscopy) you may notice spotting of blood in your stool or on the toilet paper. If you underwent a bowel prep for your procedure, you may not have a normal bowel movement for a few days.  Please Note:  You might notice some irritation and congestion in your nose or some drainage.  This is from the oxygen used during your procedure.  There is no need for concern and it should clear up in a day or so.  SYMPTOMS TO REPORT IMMEDIATELY:   Following upper endoscopy (EGD)  Vomiting of blood or coffee ground material  New chest pain or pain under the shoulder blades  Painful or persistently difficult swallowing  New shortness of breath  Fever of 100F or higher  Black, tarry-looking stools  For urgent or emergent issues, a gastroenterologist can be reached at any hour by calling [phone]. Do not use MyChart messaging for urgent concerns.    DIET:  We do recommend a small meal at first, but then you may proceed to your regular diet.  Drink plenty of fluids but you should  avoid alcoholic beverages for 24 hours.  ACTIVITY:  You should plan to take it easy for the rest of today and you should NOT DRIVE or use heavy machinery until tomorrow (because of the sedation medicines used during the test).    FOLLOW UP: Our staff will call the number listed on your records the next business day following your procedure.  We will call around 7:15- 8:00 am to check on you and address any questions or concerns that you may have regarding the information given to you following your procedure. If we do not reach you, we will leave a message.     If any biopsies were taken you will be contacted by phone or by letter within the next 1-3 weeks.  Please call us  at [phone] if you have not heard about the biopsies in 3 weeks.    SIGNATURES/CONFIDENTIALITY: You and/or your care partner have signed paperwork which will be entered into your electronic medical record.  These signatures attest to the fact that that the information above on your After Visit Summary has been reviewed and is understood.  Full responsibility of the confidentiality of this discharge information lies with you and/or your care-partner.

## 2024-01-22 NOTE — Progress Notes (Signed)
 To pacu, VSS. Report to [email or web address]

## 2024-01-22 NOTE — Op Note (Signed)
 [facility] Patient Name: Allen Russell Procedure Date: 01/22/2024 7:15 AM MRN: [id number] Endoscopist: Inocente Hausen , MD, [id number] Age: 63 Referring MD:  Date of Birth: February 14, 1961 Gender: Male Account #: [id number] Procedure:                Upper GI endoscopy Indications:              Follow-up of esophagitis -history of LA grade C                            esophagitis on EGD 09/2023 and 11/2023. Patient is                            now status post 2 months of treatment with twice                            daily PPI and Carafate . Medicines:                Monitored Anesthesia Care Procedure:                Pre-Anesthesia Assessment:                           - Prior to the procedure, a History and Physical                            was performed, and patient medications and                            allergies were reviewed. The patient's tolerance of                            previous anesthesia was also reviewed. The risks                            and benefits of the procedure and the sedation                            options and risks were discussed with the patient.                            All questions were answered, and informed consent                            was obtained. Prior Anticoagulants: The patient has                            taken no anticoagulant or antiplatelet agents. ASA                            Grade Assessment: III - A patient with severe                            systemic disease. After reviewing the risks and  benefits, the patient was deemed in satisfactory                            condition to undergo the procedure.                           After obtaining informed consent, the endoscope was                            passed under direct vision. Throughout the                            procedure, the patient's blood pressure, pulse, and                            oxygen saturations were monitored  continuously. The                            GIF [id number] #[id number] was introduced through the                            mouth, and advanced to the second part of duodenum.                            The upper GI endoscopy was accomplished without                            difficulty. The patient tolerated the procedure                            well. Scope In: Scope Out: Findings:                 The upper third of the esophagus was normal.                           Patchy, yellow plaques were found in the middle                            third of the esophagus and in the lower third of                            the esophagus.                           There were esophageal mucosal changes suspicious                            for short-segment Barrett's esophagus present in                            the lower third of the esophagus. The maximum                            longitudinal extent of these mucosal changes  was 2                            cm in length. Mucosa was biopsied with a cold                            forceps for histology. One specimen bottle was sent                            to pathology.                           Normal mucosa was found in the gastric body, in the                            gastric antrum, in the cardia (on retroflexion) and                            in the gastric fundus (on retroflexion).                           Varices with no bleeding were found in the cardia.                           Similar to prior examinations, the distal stomach                            is acutely angulated in the antral/prepyloric                            region necessitating torque in order to intubate                            the duodenum                           The duodenal bulb and second portion of the                            duodenum were normal. Complications:            No immediate complications. Estimated blood loss:                             Minimal. Estimated Blood Loss:     Estimated blood loss was minimal. Impression:               - Normal upper third of esophagus.                           - Esophageal plaques were found, suspicious for                            candidiasis.                           -  Esophageal mucosal changes suspicious for                            short-segment Barrett's esophagus. Biopsied.                           - Normal mucosa was found in the gastric body, in                            the antrum, in the cardia and in the gastric fundus.                           - Gastric varices, without bleeding.                           - Normal duodenal bulb and second portion of the                            duodenum. Recommendation:           - Discharge patient to home (ambulatory).                           - Await pathology results.                           - Continue PPI twice daily                           - Prescription for nystatin  suspension x 14 days                            sent to pharmacy for treatment of mild esophageal                            candidiasis.                           - Timing of repeat EGD to be dependent upon the                            results of biopsies and if they show evidence of                            Barrett's esophagus                           - The findings and recommendations were discussed                            with the patient's family.                           - Patient has a contact number available for  emergencies. The signs and symptoms of potential                            delayed complications were discussed with the                            patient. Return to normal activities tomorrow.                            Written discharge instructions were provided to the                            patient. Inocente Hausen, MD 01/22/2024 8:23:50 AM This report has been signed electronically.

## 2024-01-22 NOTE — Progress Notes (Signed)
 Pt's states no medical or surgical changes since previsit or office visit.

## 2024-01-22 NOTE — Progress Notes (Signed)
 Called to room to assist during endoscopic procedure.  Patient ID and intended procedure confirmed with present staff. Received instructions for my participation in the procedure from the performing physician.

## 2024-01-23 ENCOUNTER — Telehealth: Payer: Self-pay

## 2024-01-23 NOTE — Telephone Encounter (Signed)
 Attempted to reach patient for follow up phone call. No answer, left voicemail to contact Dr. Andy office with any questions or concerns.

## 2024-01-24 LAB — SURGICAL PATHOLOGY

## 2024-01-28 ENCOUNTER — Ambulatory Visit: Payer: Self-pay | Admitting: Pediatrics

## 2024-04-14 NOTE — Progress Notes (Unsigned)
 04/15/2024 BRISCOE DANIELLO [id number] [birth year]  Referring provider: Linde Bitters, PA-C Primary GI doctor: Dr. 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 presenting with Abnormal stools alternating loose stools and constipation Having days without a BM, hard to loose stools, bloating, rare AB pain No fever, chills, weight loss, no melena, no hematochezia, no rectal pain, no urinary symptoms, complete BM's On Creon  36000 units 2 pills with meals and 1 pill with snack I think he is taking the creon  appropriately, sounds more like IBS C or mixed versus SIBO - can consider fecal fat/pancreatic elastase but not needed at this time - continue creon  same dose, discussed appropriate timing, etc - Increase fiber/ water intake, decrease caffeine , increase activity level. 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 normal duodenal bulb and second portion of duodenum 01/22/2024 EGD esophageal plaques suspicious for candidiasis changes suspicious for short segment Barrett's 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 pathology showed intestinal metaplasia negative for dysplasia recall 5 years No ETOH, no NSAIDS -continue pantoprazole  40 mg twice daily -Recall EGD 10/2028  Anemia 10/16/2023  HGB 13.5 MCV 95.4 Platelets 193.0 10/16/2023 Iron  88 Ferritin 40.5 B12 824 Recent Labs     09/21/23 1254 09/21/23 1636 09/22/23 0004 09/22/23 0820 09/22/23 1539 09/22/23 2106 09/22/23 2347 09/23/23 0555 09/24/23 0440 10/16/23 1400  HGB 6.9* 9.7* 7.8* 9.2* 8.4* 8.6* 8.4* 9.1* 8.8* 13.5  Colonoscopy 2023 for subcentimeter polyps recall 2024 09/2023 CTA negative 09/2023 capsule endoscopy unremarkable 09/2023 EGD showed LA grade C erosive esophagitis. Enteroscopy unrevealing.  Negative Meckel scan 11/13/2023 colonoscopy multiple polyps stool in colon IH recall 3 years 11/13/2023 EGD grade C esophagitis repeat January 22, 2024 short Barrett's esophagus, gastric varices and intestinal metaplasia without dysplasia recall 5 years  Personal history of colon polyps 11/13/2023 colonoscopy 5 polyps 5 to 7 mm transverse: Ascending colon cecum, stool in the entire exam lavaged adequate visualization internal hemorrhoids, recall 3 years  I have reviewed the clinic note as outlined by Alan Coombs, PA and agree with the assessment, plan and medical decision making.   Katheryn returns to the office today to follow-up a history of EPI, previous GI bleeding and anemia, colon polyps and LA grade C esophagitis.  At today's visit is reporting alternating bowel habits with some symptoms of constipation.  Agree with conservative management with MiraLAX  and Benefiber.  He had a recent colonoscopy without concerning lesions other than polyps.  Agree with continuing current dose of Creon .  Most recent EDG showed evidence of short segment Barrett's esophagus -repeat EGD in 5 years unless symptoms warrant sooner procedure.  Next colonoscopy in 3 years for polyp surveillance.  Anemia related to previous GI bleeding has resolved.  Inocente Suzann, MD   Patient Care Team: Toepfer, Olivia, PA-C as PCP - General (Physician Assistant)  HISTORY OF PRESENT ILLNESS: 63 y.o. male with a past medical history of alcohol induced pancreatitis complicated by necrosis requiring necrosectomy and cystogastrostomy in 2012,  recurrent pancreatitis and enlarging pancreatic fluid collection in  2019 s/p EUS with cystogastrostomy, EPI and others listed below presents for evaluation of EPI.   Patient last seen in clinic 10/16/2023 by Con Blower, PA for hospital follow-up for symptomatic anemia and melena.  01/22/2024 repeat EGD colonoscopy for esophagitis and polyps.  Discussed the use of AI scribe software for clinical note transcription with the patient, who gave verbal consent to proceed.  History of Present Illness   ELEANOR DIMICHELE is a 63 year old male with chronic pancreatitis and exocrine pancreatic insufficiency who presents for gastrointestinal evaluation and management.  He has a history of pancreatitis from 2012 and 2019, resulting in chronic pancreatitis and exocrine pancreatic insufficiency. He has been on Creon  since late 2021, taking two capsules of 36,000 units with meals and one capsule with snacks. Despite this regimen, he experiences irregular bowel habits, with episodes of loose stools and constipation, sometimes going several days without a bowel movement. When he does have a bowel movement, the stools can vary from well-formed to loose. This pattern has persisted for a couple of years.  In May 2025, he underwent a colonoscopy and endoscopy. The colonoscopy revealed internal hemorrhoids and multiple polyps, with a recall in three years. The endoscopy showed esophageal inflammation, which was repeated in July 2025, revealing Barrett's esophagus and gastric varices. He is on a proton pump inhibitor twice daily but is no longer taking Carafate . No nausea, vomiting, or trouble swallowing.  He has a history of anemia, which has improved recently. He also has chronic kidney disease and is cautious with medications, avoiding NSAIDs and using Tylenol  instead. He reports bloating and occasional lower abdominal pain, which has been painful in the last few days. No black stools or blood in the stool, although he was  hospitalized earlier this year for internal bleeding.  He is on insulin  therapy, using a long-acting insulin  at 12 units nightly and a short-acting insulin  at 2-4 units before meals. He recently started using an insulin  pump to reduce the frequency of injections. He drinks plenty of water and avoids sodas, only consuming juice when his blood sugar drops.  He experienced significant weight loss earlier this year, dropping from 220 pounds to 168 pounds, but has since stabilized. No fevers, chills, or further weight loss. He has a family history of regular bowel movements, unlike his own experience.        He  reports that he has never smoked. He has never used smokeless tobacco. He reports that he does not currently use alcohol. He reports that he does not currently use drugs.  RELEVANT GI HISTORY, IMAGING AND LABS: Results   LABS CBC: Normal (12/2023)  DIAGNOSTIC Colonoscopy: Internal hemorrhoids, multiple polyps (11/2023) Endoscopy: Esophageal inflammation, Barrett's esophagus, gastric varices (11/2023, 01/2024)     Meckel scan negative   Capsule endoscopy 09/21/23 recurrent drop in hemoglobin with EGD showing LA grade C esophagitis, no active bleeding.  CTA negative for bleeding.  Small bowel endoscopy negative for bleeding. - No evidence of bleeding in entire small bowel distal to duodenum.  Questionable ileal diverticulum/Meckel's diverticulum   Small bowel enteroscopy 09/21/2023 - Normal upper third of esophagus and middle third of esophagus.  - LA Grade C erosive esophagitis with no bleeding.  - Gastric varices, without bleeding.  - Normal gastric body and antrum.  - Normal cardia.  - Normal examined duodenum.  - The examined portion of the jejunum was normal. Tattooed.  - Successful completion of the Video Capsule Enteroscope placement into the stomach. Acute angulation  of the prepyloric region into the duodenal bulb precluded ability to deploy video capsule into the small  bowel.  - No specimens collected.   EGD 09/19/2023 - Normal upper third of esophagus and middle third of esophagus.  - LA Grade C erosive esophagitis with no bleeding.  - Hematin ( altered blood/ coffee- ground- like material) in the entire stomach.  - Normal duodenal bulb and second portion of the duodenum.  - No specimens collected.  - EGD shows evidence of previous bleeding in the upper GI tract based upon scattered hematin throughout the stomach. The only pathologic finding identified was grade C esophagitis which could have contributed to bleeding although would be less likely to cause hypotension. A Dieulafoy lesion is another consideration which could cause intermittent bleeding leading to an absence of significant findings on EGD today. - Repeat 8 to 12 weeks   Colonoscopy 01/2022 4 subcentimeter polyps were removed    EGD 02/14/18 with collapsed cavity without necrosis, axios left in place.  EGD 03/29/18 with concern for 2cm segment of suspected Barrett's (path negative), AXIOS stent removed  CBC    Component Value Date/Time   WBC 4.3 10/16/2023 1400   RBC 4.30 10/16/2023 1400   HGB 13.5 10/16/2023 1400   HGB 10.6 (L) 10/19/2021 1641   HCT 41.1 10/16/2023 1400   HCT 32.7 (L) 10/19/2021 1641   PLT 193.0 10/16/2023 1400   PLT 220 10/19/2021 1641   MCV 95.4 10/16/2023 1400   MCV 88 10/19/2021 1641   MCH 31.1 09/24/2023 0440   MCHC 32.9 10/16/2023 1400   RDW 16.4 (H) 10/16/2023 1400   RDW 13.5 10/19/2021 1641   LYMPHSABS 0.7 10/16/2023 1400   MONOABS 0.4 10/16/2023 1400   EOSABS 0.1 10/16/2023 1400   BASOSABS 0.0 10/16/2023 1400   Recent Labs    09/21/23 1254 09/21/23 1636 09/22/23 0004 09/22/23 0820 09/22/23 1539 09/22/23 2106 09/22/23 2347 09/23/23 0555 09/24/23 0440 10/16/23 1400  HGB 6.9* 9.7* 7.8* 9.2* 8.4* 8.6* 8.4* 9.1* 8.8* 13.5    CMP     Component Value Date/Time   NA 136 10/16/2023 1400   NA 136 10/24/2021 1553   K 4.1 10/16/2023 1400   CL 101  10/16/2023 1400   CO2 28 10/16/2023 1400   GLUCOSE 88 10/16/2023 1400   BUN 33 (H) 10/16/2023 1400   BUN 31 (H) 10/24/2021 1553   CREATININE 1.82 (H) 10/16/2023 1400   CALCIUM 9.2 10/16/2023 1400   PROT 7.1 10/16/2023 1400   PROT 6.6 10/19/2021 1641   ALBUMIN  4.7 10/16/2023 1400   ALBUMIN  4.6 10/19/2021 1641   AST 20 10/16/2023 1400   ALT 21 10/16/2023 1400   ALKPHOS 69 10/16/2023 1400   BILITOT 0.5 10/16/2023 1400   BILITOT 0.3 10/19/2021 1641   GFRNONAA 44 (L) 09/24/2023 0440      Latest Ref Rng & Units 10/16/2023    2:00 PM 09/17/2023    5:15 PM 10/19/2021    4:41 PM  Hepatic Function  Total Protein 6.0 - 8.3 g/dL 7.1  5.1  6.6   Albumin  3.5 - 5.2 g/dL 4.7  3.0  4.6   AST 0 - 37 U/L 20  31  27    ALT 0 - 53 U/L 21  34  31   Alk Phosphatase 39 - 117 U/L 69  45  56   Total Bilirubin 0.2 - 1.2 mg/dL 0.5  0.5  0.3       Current Medications:   Current Outpatient Medications (  Endocrine & Metabolic):    BAQSIMI ONE PACK 3 MG/DOSE POWD, Place 1 spray into the nose once.   insulin  aspart (NOVOLOG ) 100 UNIT/ML injection, Inject 0-5 Units into the skin 2 (two) times daily as needed (for BG greater than 180).   TRESIBA FLEXTOUCH 100 UNIT/ML FlexTouch Pen, INJECT 12 AT BEDTIME  Current Outpatient Medications (Cardiovascular):    tadalafil (CIALIS) 20 MG tablet, Take 20 mg by mouth as needed for erectile dysfunction.     Current Outpatient Medications (Other):    Blood Glucose Monitoring Suppl (TRUE METRIX METER) w/Device KIT, Use up to four times daily as directed.   Cholecalciferol (VITAMIN D3) 75 MCG (3000 UT) TABS, Take 3,000 Units by mouth daily.   Continuous Glucose Sensor (DEXCOM G7 SENSOR) MISC, SMARTSIG:Every 10 Days   cyclobenzaprine  (FLEXERIL ) 10 MG tablet, TAKE 1 TABLET BY MOUTH AS NEEDED AT BEDTIME FOR MUSCLE SPASM   DESCOVY 200-25 MG tablet, Take 1 tablet by mouth daily.   diazepam (VALIUM) 2 MG tablet, Take 2 mg by mouth at bedtime as needed (for leg cramps).    doxycycline (ADOXA) 100 MG tablet, Take by mouth.   glucose blood (TRUE METRIX BLOOD GLUCOSE TEST) test strip, Use as directed to test blood sugar   Insulin  Pen Needle 32G X 4 MM MISC, Use 1 as directed in the morning, at noon, in the evening, and at bedtime (4 times a day) with insulin    lipase/protease/amylase (CREON ) 36000 UNITS CPEP capsule, Take 1 capsule (36,000 Units total) by mouth 3 (three) times daily with meals.   Multiple Vitamin (MULTIVITAMIN) tablet, Take 1 tablet by mouth daily.   pantoprazole  (PROTONIX ) 40 MG tablet, Take 1 tablet (40 mg total) by mouth 2 (two) times daily before a meal.   traZODone  (DESYREL ) 50 MG tablet, Take 1 tablet by mouth daily.   TRUEplus Lancets 28G MISC, Use as directed to test blood sugar  Medical History:  Past Medical History:  Diagnosis Date   'Light-for-dates' infant with signs of fetal malnutrition 04/12/2021   Adrenal insufficiency 04/12/2021   Chronic kidney disease    Per pt, fluctuates between Stage IIIB and IV from April 2023.   Diabetes mellitus without complication (HCC)    Ketoacidosis 03/21/2021   Shock (HCC)    Allergies:  Allergies  Allergen Reactions   Morphine Other (See Comments)    headache       Surgical History:  He  has a past surgical history that includes Back surgery; Appendectomy; Cholecystectomy; Hernia repair; Esophagogastroduodenoscopy (N/A, 09/19/2023); enteroscopy (N/A, 09/21/2023); and Givens capsule study (N/A, 09/21/2023). Family History:  His family history includes Breast cancer in his mother and sister; Hypertension in his paternal grandfather; Ovarian cancer in his maternal grandmother; Pancreatic cancer in his maternal grandmother; Thyroid disease in his mother.  REVIEW OF SYSTEMS  : All other systems reviewed and negative except where noted in the History of Present Illness.  PHYSICAL EXAM: BP 102/64   Pulse 84   Ht 6' 4 (1.93 m)   Wt 220 lb 3.2 oz (99.9 kg)   BMI 26.80 kg/m  Physical Exam    GENERAL APPEARANCE: Well nourished, in no apparent distress HEENT: No cervical lymphadenopathy, unremarkable thyroid, sclerae anicteric, conjunctiva pink RESPIRATORY: Respiratory effort normal, BS equal bilateral without rales, rhonchi, wheezing CARDIO: RRR with no MRGs, peripheral pulses intact ABDOMEN: Soft, non distended, active bowel sounds in all 4 quadrants, no tenderness to palpation, no rebound, no mass appreciated RECTAL: declines MUSCULOSKELETAL: Full ROM, normal gait, without  edema SKIN: Dry, intact without rashes or lesions. No jaundice. NEURO: Alert, oriented, no focal deficits PSYCH: Cooperative, normal mood and affect.      Alan JONELLE Coombs, PA-C 8:48 AM

## 2024-04-15 ENCOUNTER — Ambulatory Visit (INDEPENDENT_AMBULATORY_CARE_PROVIDER_SITE_OTHER)
Admission: RE | Admit: 2024-04-15 | Discharge: 2024-04-15 | Disposition: A | Discharge status: Home / Self Care | Source: Ambulatory Visit | Attending: Physician Assistant | Admitting: Physician Assistant

## 2024-04-15 ENCOUNTER — Ambulatory Visit: Admitting: Physician Assistant

## 2024-04-15 ENCOUNTER — Ambulatory Visit: Payer: Self-pay | Admitting: Physician Assistant

## 2024-04-15 ENCOUNTER — Encounter: Payer: Self-pay | Admitting: Physician Assistant

## 2024-04-15 VITALS — BP 102/64 | HR 84 | Ht 76.0 in | Wt 220.2 lb

## 2024-04-15 DIAGNOSIS — E1110 Type 2 diabetes mellitus with ketoacidosis without coma: Secondary | ICD-10-CM

## 2024-04-15 DIAGNOSIS — R198 Other specified symptoms and signs involving the digestive system and abdomen: Secondary | ICD-10-CM

## 2024-04-15 DIAGNOSIS — K860 Alcohol-induced chronic pancreatitis: Secondary | ICD-10-CM

## 2024-04-15 DIAGNOSIS — B3781 Candidal esophagitis: Secondary | ICD-10-CM

## 2024-04-15 DIAGNOSIS — K8689 Other specified diseases of pancreas: Secondary | ICD-10-CM

## 2024-04-15 DIAGNOSIS — K22711 Barrett's esophagus with high grade dysplasia: Secondary | ICD-10-CM

## 2024-04-15 DIAGNOSIS — K2210 Ulcer of esophagus without bleeding: Secondary | ICD-10-CM

## 2024-04-15 DIAGNOSIS — K861 Other chronic pancreatitis: Secondary | ICD-10-CM | POA: Diagnosis not present

## 2024-04-15 DIAGNOSIS — D509 Iron deficiency anemia, unspecified: Secondary | ICD-10-CM

## 2024-04-15 DIAGNOSIS — Z860100 Personal history of colon polyps, unspecified: Secondary | ICD-10-CM

## 2024-04-15 DIAGNOSIS — K922 Gastrointestinal hemorrhage, unspecified: Secondary | ICD-10-CM

## 2024-04-15 NOTE — Patient Instructions (Addendum)
 Your provider has requested that you have an abdominal x ray before leaving today. Please go to the basement floor to our Radiology department for the test.  Miralax  is an osmotic laxative.  It only brings more water into the stool.  This is safe to take daily.  Can take up to 17 gram of miralax  twice a day.  Mix with juice or coffee.  Start 1 capful at night for 3-4 days and reassess your response in 3-4 days.  You can increase and decrease the dose based on your response.  Remember, it can take up to 3-4 days to take effect OR for the effects to wear off.   I often pair this with benefiber in the morning to help assure the stool is not too loose.   FIBER SUPPLEMENT You can do metamucil or fibercon once or twice a day but if this causes gas/bloating please switch to Benefiber or Citracel.  Fiber is good for constipation/diarrhea/irritable bowel syndrome.  It can also help with weight loss and can help lower your bad cholesterol (LDL).  Please do 1 TBSP in the morning in water, coffee, or tea.  It can take up to a month before you can see a difference with your bowel movements.  It is cheapest from [employer], [employer], [employer].   Toileting tips to help with your constipation - Drink at least 64-80 ounces of water/liquid per day. - Establish a time to try to move your bowels every day.  For many people, this is after a cup of coffee or after a meal such as breakfast. - Sit all of the way back on the toilet keeping your back fairly straight and while sitting up, try to rest the tops of your forearms on your upper thighs.   - Raising your feet with a step stool/squatty potty can be helpful to improve the angle that allows your stool to pass through the rectum. - Relax the rectum feeling it bulge toward the toilet water.  If you feel your rectum raising toward your body, you are contracting rather than relaxing. - Breathe in and slowly exhale. Belly breath by expanding your belly towards your  belly button. Keep belly expanded as you gently direct pressure down and back to the anus.  A low pitched GRRR sound can assist with increasing intra-abdominal pressure.  (Can also trying to blow on a pinwheel and make it move, this helps with the same belly breathing) - Repeat 3-4 times. If unsuccessful, contract the pelvic floor to restore normal tone and get off the toilet.  Avoid excessive straining. - To reduce excessive wiping by teaching your anus to normally contract, place hands on outer aspect of knees and resist knee movement outward.  Hold 5-10 second then place hands just inside of knees and resist inward movement of knees.  Hold 5 seconds.  Repeat a few times each way.  Go to the ER if unable to pass gas, severe AB pain, unable to hold down food, any shortness of breath of chest pain.  Linzess  *IBS-C patients may begin to experience relief from belly pain and overall abdominal symptoms (pain, discomfort, and bloating) in about 1 week,  with symptoms typically improving over 12 weeks.  Take at least 30 minutes before the first meal of the day on an empty stomach You can have a loose stool if you eat a high-fat breakfast. Give it at least 7 days, may have more bowel movements during that time.   The diarrhea  should go away and you should start having normal, complete, full bowel movements.  It may be helpful to start treatment when you can be near the comfort of your own bathroom, such as a weekend.  After you are out we can send in a prescription if you did well, there is a prescription card  Small intestinal bacterial overgrowth (SIBO) occurs when there is an abnormal increase in the overall bacterial population in the small intestine -- particularly types of bacteria not commonly found in that part of the digestive tract. Small intestinal bacterial overgrowth (SIBO) commonly results when a circumstance -- such as surgery or disease -- slows the passage of food and waste products in  the digestive tract, creating a breeding ground for bacteria.  Signs and symptoms of SIBO often include: Loss of appetite Abdominal pain Nausea Bloating An uncomfortable feeling of fullness after eating Diarrhea or constipation, depending on the type of gas produced  What foods trigger SIBO? While foods aren't the original cause of SIBO, certain foods do encourage the overgrowth of the wrong bacteria in your small intestine. If you're feeding them their favorite foods, they're going to grow more, and that will trigger more of your SIBO symptoms. By the same token, you can help reduce the overgrowth by starving the problematic bacteria of their favorite foods. This strategy has led to a number of proposed SIBO eating plans. The plans vary, and so do individual results. But in general, they tend to recommend limiting carbohydrates.  These include: Sugars and sweeteners. Fruits and starchy vegetables. Dairy products. Grains.  There is a test for this we can do called a breath test, if you are positive we will treat you with an antibiotic to see if it helps.  Your symptoms are very suspicious for this condition, as discussed, we will start you on an antibiotic to see if this helps.   Due to recent changes in healthcare laws, you may see the results of your imaging and laboratory studies on MyChart before your provider has had a chance to review them.  We understand that in some cases there may be results that are confusing or concerning to you. Not all laboratory results come back in the same time frame and the provider may be waiting for multiple results in order to interpret others.  Please give us  48 hours in order for your provider to thoroughly review all the results before contacting the office for clarification of your results.    I appreciate the  opportunity to care for you  Thank You   [facility]

## 2024-06-16 ENCOUNTER — Other Ambulatory Visit

## 2024-06-16 ENCOUNTER — Ambulatory Visit: Admitting: Physician Assistant

## 2024-06-16 ENCOUNTER — Ambulatory Visit: Payer: Self-pay | Admitting: Physician Assistant

## 2024-06-16 ENCOUNTER — Encounter: Payer: Self-pay | Admitting: Physician Assistant

## 2024-06-16 VITALS — BP 112/78 | HR 74 | Ht 76.0 in | Wt 214.4 lb

## 2024-06-16 DIAGNOSIS — E538 Deficiency of other specified B group vitamins: Secondary | ICD-10-CM | POA: Diagnosis not present

## 2024-06-16 DIAGNOSIS — K861 Other chronic pancreatitis: Secondary | ICD-10-CM

## 2024-06-16 DIAGNOSIS — K860 Alcohol-induced chronic pancreatitis: Secondary | ICD-10-CM

## 2024-06-16 DIAGNOSIS — D509 Iron deficiency anemia, unspecified: Secondary | ICD-10-CM

## 2024-06-16 DIAGNOSIS — K8689 Other specified diseases of pancreas: Secondary | ICD-10-CM

## 2024-06-16 DIAGNOSIS — R198 Other specified symptoms and signs involving the digestive system and abdomen: Secondary | ICD-10-CM

## 2024-06-16 DIAGNOSIS — E1122 Type 2 diabetes mellitus with diabetic chronic kidney disease: Secondary | ICD-10-CM

## 2024-06-16 DIAGNOSIS — K2210 Ulcer of esophagus without bleeding: Secondary | ICD-10-CM

## 2024-06-16 LAB — CBC WITH DIFFERENTIAL/PLATELET
Basophils Absolute: 0 K/uL (ref 0.0–0.1)
Basophils Relative: 0.7 % (ref 0.0–3.0)
Eosinophils Absolute: 0.1 K/uL (ref 0.0–0.7)
Eosinophils Relative: 2 % (ref 0.0–5.0)
HCT: 42.7 % (ref 39.0–52.0)
Hemoglobin: 14.5 g/dL (ref 13.0–17.0)
Lymphocytes Relative: 24.1 % (ref 12.0–46.0)
Lymphs Abs: 1.4 K/uL (ref 0.7–4.0)
MCHC: 33.8 g/dL (ref 30.0–36.0)
MCV: 89.9 fl (ref 78.0–100.0)
Monocytes Absolute: 0.7 K/uL (ref 0.1–1.0)
Monocytes Relative: 11.4 % (ref 3.0–12.0)
Neutro Abs: 3.5 K/uL (ref 1.4–7.7)
Neutrophils Relative %: 61.8 % (ref 43.0–77.0)
Platelets: 221 K/uL (ref 150.0–400.0)
RBC: 4.75 Mil/uL (ref 4.22–5.81)
RDW: 16 % — ABNORMAL HIGH (ref 11.5–15.5)
WBC: 5.7 K/uL (ref 4.0–10.5)

## 2024-06-16 LAB — COMPREHENSIVE METABOLIC PANEL WITH GFR
ALT: 17 U/L (ref 0–53)
AST: 18 U/L (ref 0–37)
Albumin: 4.7 g/dL (ref 3.5–5.2)
Alkaline Phosphatase: 73 U/L (ref 39–117)
BUN: 31 mg/dL — ABNORMAL HIGH (ref 6–23)
CO2: 31 meq/L (ref 19–32)
Calcium: 9.7 mg/dL (ref 8.4–10.5)
Chloride: 99 meq/L (ref 96–112)
Creatinine, Ser: 1.83 mg/dL — ABNORMAL HIGH (ref 0.40–1.50)
GFR: 39.49 mL/min — ABNORMAL LOW (ref >60.00)
Glucose, Bld: 154 mg/dL — ABNORMAL HIGH (ref 70–99)
Potassium: 4.7 meq/L (ref 3.5–5.1)
Sodium: 135 meq/L (ref 135–145)
Total Bilirubin: 0.7 mg/dL (ref 0.2–1.2)
Total Protein: 7.9 g/dL (ref 6.0–8.3)

## 2024-06-16 LAB — IBC + FERRITIN
Ferritin: 17.9 ng/mL — ABNORMAL LOW (ref 22.0–322.0)
Iron: 88 ug/dL (ref 42–165)
Saturation Ratios: 20 % (ref 20.0–50.0)
TIBC: 441 ug/dL (ref 250.0–450.0)
Transferrin: 315 mg/dL (ref 212.0–360.0)

## 2024-06-16 LAB — VITAMIN B12: Vitamin B-12: 843 pg/mL (ref 211–911)

## 2024-06-16 MED ORDER — METRONIDAZOLE 250 MG PO TABS: 250.0000 mg | ORAL_TABLET | Freq: Three times a day (TID) | ORAL | 0 refills | Status: AC

## 2024-06-16 NOTE — Progress Notes (Signed)
 06/16/2024 [name] [id number] 11/03/1960  Referring provider: Linde Bitters, PA-C Primary GI doctor: Dr. 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 - Trial of flagyl  250mg  TID for 10 days for SIBO, may need retreatment if partial response with neomycin -consider linzess 72 mcg if no response  Anemia resolved, possible secondary to CKD 10/16/2023  HGB 13.5 MCV 95.4 Platelets 193.0 10/16/2023 Iron  88 Ferritin 40.5 B12 824 Recent Labs    09/21/23 1254 09/21/23 1636 09/22/23 0004 09/22/23 0820 09/22/23 1539 09/22/23 2106 09/22/23 2347 09/23/23 0555 09/24/23 0440 10/16/23 1400  HGB 6.9* 9.7* 7.8* 9.2* 8.4* 8.6* 8.4* 9.1* 8.8* 13.5  Colonoscopy 2023 for subcentimeter polyps recall 2024 09/2023 CTA negative 09/2023 capsule endoscopy unremarkable 09/2023 EGD showed LA grade C erosive esophagitis. Enteroscopy unrevealing.  Negative Meckel scan 11/13/2023 colonoscopy multiple polyps stool in colon IH recall 3 years 11/13/2023 EGD grade C esophagitis repeat January 22, 2024 short Barrett's esophagus, gastric varices and intestinal metaplasia without dysplasia , recall 5 years Has continued to have fatigue, getting sleep study in Jan Denies hematochezia and melena, not on an iron  at this time -  will repeat iron , ferritin, B12 - consider repeat scan versus EGD/colon if low iron   Personal history of colon polyps 11/13/2023 colonoscopy 5 polyps 5 to 7 mm transverse: Ascending colon cecum, stool in the entire exam lavaged adequate visualization internal hemorrhoids, recall 3 years  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 Recurrent pancreatitis and enlarging pancreatic fluid collection in 2019 status post EUS cystogastrostomy Denies ETOH use Continue to monitor  Short segment Barrett's esophagus with history of grade C esophagitis/candidiasis 11/13/2023 EGD grade C erosive esophagitis, normal gastric body, 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 normal duodenal bulb and second portion of duodenum 01/22/2024 EGD esophageal plaques suspicious for candidiasis changes suspicious for short segment Barrett's 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 pathology showed intestinal metaplasia negative for dysplasia recall 5 years No ETOH, no NSAIDS -continue pantoprazole  40 mg twice daily -Recall EGD 10/2028  Patient Care Team: Linde Bitters, PA-C as PCP - General (Physician Assistant)  HISTORY OF PRESENT ILLNESS: 63 y.o. 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   Patient last seen in clinic 10/16/2023 by Con Blower, PA for hospital follow-up for symptomatic anemia and melena.  01/22/2024 repeat EGD colonoscopy for esophagitis and polyps.  Discussed the use of AI scribe software for clinical note transcription with the patient, who gave verbal consent to proceed.  History of Present Illness   Allen Russell is a 63 year old male with pancreatic insufficiency and anemia who presents with  fatigue and bowel irregularities.  He experiences ongoing fatigue and is scheduled for a sleep study  next month. He has a history of anemia, but recent labs to check his iron  and ferritin levels have not been conducted. He is not currently on iron  supplements due to kidney function concerns. His last lab results in April showed improved hemoglobin at 13.5, ferritin at 40, and iron  at 80. B12 levels were good at 824.  He experiences bowel irregularities, including alternating between constipation and loose stools. He has bloating and occasional abdominal discomfort, but no significant abdominal pain. He has not noticed any black stools, only occasionally darker stools. An x-ray from October 14th showed prominent stool throughout the colon. He has not used Miralax  or fiber supplements regularly.  He is on Creon  for pancreatic insufficiency, which helps manage his symptoms. He also uses both short-acting and long-acting insulin  via a pump for diabetes management. He reports difficulty initiating urination and some bladder emptying issues.  He experienced significant weight loss in 2022, dropping from 215 to 170 pounds over approximately eight weeks. His weight has since stabilized, and he no longer consumes alcohol.        He  reports that he has never smoked. He has never used smokeless tobacco. He reports that he does not currently use alcohol. He reports that he does not currently use drugs.  RELEVANT GI HISTORY, IMAGING AND LABS: Results   LABS Hemoglobin: 13.5 (10/2023) Ferritin: 40 (10/2023) Iron : 80 (10/2023) B12: 824 (10/2023)  RADIOLOGY Abdominal X-ray: Prominent stool throughout colon indicating constipation (04/15/2024)     Meckel scan negative   Capsule endoscopy 09/21/23 recurrent drop in hemoglobin with EGD showing LA grade C esophagitis, no active bleeding.  CTA negative for bleeding.  Small bowel endoscopy negative for bleeding. - No evidence of bleeding in entire small  bowel distal to duodenum.  Questionable ileal diverticulum/Meckel's diverticulum   Small bowel enteroscopy 09/21/2023 - Normal upper third of esophagus and middle third of esophagus.  - LA Grade C erosive esophagitis with no bleeding.  - Gastric varices, without bleeding.  - Normal gastric body and antrum.  - Normal cardia.  - Normal examined duodenum.  - The examined portion of the jejunum was normal. Tattooed.  - Successful completion of the Video Capsule Enteroscope placement into the stomach. Acute angulation of the prepyloric region into the duodenal bulb precluded ability to deploy video capsule into the small bowel.  - No specimens collected.   EGD 09/19/2023 - Normal upper third of esophagus and middle third of esophagus.  - LA Grade C erosive esophagitis with no bleeding.  - Hematin ( altered blood/ coffee- ground- like material) in the entire stomach.  - Normal duodenal bulb and second portion of the duodenum.  - No specimens collected.  - EGD shows evidence of previous bleeding in the upper GI tract based upon scattered hematin throughout the stomach. The only pathologic finding identified was grade C esophagitis which could have contributed to bleeding although would be less likely to cause hypotension. A Dieulafoy lesion is another consideration which could cause intermittent bleeding leading to an absence of significant findings on EGD today. - Repeat 8 to 12 weeks   Colonoscopy 01/2022 4 subcentimeter polyps were removed    EGD 02/14/18 with collapsed cavity without necrosis, axios left in place.  EGD 03/29/18 with concern for 2cm segment of suspected Barrett's (path negative), AXIOS stent removed  CBC    Component Value Date/Time   WBC 4.3 10/16/2023 1400   RBC 4.30 10/16/2023 1400   HGB 13.5 10/16/2023 1400   HGB 10.6 (  L) 10/19/2021 1641   HCT 41.1 10/16/2023 1400   HCT 32.7 (L) 10/19/2021 1641   PLT 193.0 10/16/2023 1400   PLT 220 10/19/2021 1641   MCV 95.4  10/16/2023 1400   MCV 88 10/19/2021 1641   MCH 31.1 09/24/2023 0440   MCHC 32.9 10/16/2023 1400   RDW 16.4 (H) 10/16/2023 1400   RDW 13.5 10/19/2021 1641   LYMPHSABS 0.7 10/16/2023 1400   MONOABS 0.4 10/16/2023 1400   EOSABS 0.1 10/16/2023 1400   BASOSABS 0.0 10/16/2023 1400   Recent Labs    09/21/23 1254 09/21/23 1636 09/22/23 0004 09/22/23 0820 09/22/23 1539 09/22/23 2106 09/22/23 2347 09/23/23 0555 09/24/23 0440 10/16/23 1400  HGB 6.9* 9.7* 7.8* 9.2* 8.4* 8.6* 8.4* 9.1* 8.8* 13.5    CMP     Component Value Date/Time   NA 136 10/16/2023 1400   NA 136 10/24/2021 1553   K 4.1 10/16/2023 1400   CL 101 10/16/2023 1400   CO2 28 10/16/2023 1400   GLUCOSE 88 10/16/2023 1400   BUN 33 (H) 10/16/2023 1400   BUN 31 (H) 10/24/2021 1553   CREATININE 1.82 (H) 10/16/2023 1400   CALCIUM 9.2 10/16/2023 1400   PROT 7.1 10/16/2023 1400   PROT 6.6 10/19/2021 1641   ALBUMIN  4.7 10/16/2023 1400   ALBUMIN  4.6 10/19/2021 1641   AST 20 10/16/2023 1400   ALT 21 10/16/2023 1400   ALKPHOS 69 10/16/2023 1400   BILITOT 0.5 10/16/2023 1400   BILITOT 0.3 10/19/2021 1641   GFRNONAA 44 (L) 09/24/2023 0440      Latest Ref Rng & Units 10/16/2023    2:00 PM 09/17/2023    5:15 PM 10/19/2021    4:41 PM  Hepatic Function  Total Protein 6.0 - 8.3 g/dL 7.1  5.1  6.6   Albumin  3.5 - 5.2 g/dL 4.7  3.0  4.6   AST 0 - 37 U/L 20  31  27    ALT 0 - 53 U/L 21  34  31   Alk Phosphatase 39 - 117 U/L 69  45  56   Total Bilirubin 0.2 - 1.2 mg/dL 0.5  0.5  0.3       Current Medications:   Current Outpatient Medications (Endocrine & Metabolic):    BAQSIMI ONE PACK 3 MG/DOSE POWD, Place 1 spray into the nose once.   insulin  aspart (NOVOLOG ) 100 UNIT/ML injection, Inject 0-5 Units into the skin 2 (two) times daily as needed (for BG greater than 180).   TRESIBA FLEXTOUCH 100 UNIT/ML FlexTouch Pen, INJECT 12 AT BEDTIME  Current Outpatient Medications (Cardiovascular):    tadalafil (CIALIS) 20 MG tablet,  Take 20 mg by mouth as needed for erectile dysfunction.  Current Outpatient Medications (Analgesics):    ibuprofen (ADVIL) 800 MG tablet, Take 800 mg by mouth every 6 (six) hours as needed. for pain  Current Outpatient Medications (Other):    Blood Glucose Monitoring Suppl (TRUE METRIX METER) w/Device KIT, Use up to four times daily as directed.   CEQUR SIMPLICITY 2U DEVI, Change patch every 3-4 days   Cholecalciferol (VITAMIN D3) 75 MCG (3000 UT) TABS, Take 3,000 Units by mouth daily.   Continuous Glucose Sensor (DEXCOM G7 SENSOR) MISC, SMARTSIG:Every 10 Days   cyclobenzaprine  (FLEXERIL ) 10 MG tablet, TAKE 1 TABLET BY MOUTH AS NEEDED AT BEDTIME FOR MUSCLE SPASM   DESCOVY 200-25 MG tablet, Take 1 tablet by mouth daily.   diazepam (VALIUM) 2 MG tablet, Take 2 mg by mouth at bedtime as needed (for  leg cramps).   doxycycline (ADOXA) 100 MG tablet, Take by mouth.   glucose blood (TRUE METRIX BLOOD GLUCOSE TEST) test strip, Use as directed to test blood sugar   Insulin  Pen Needle 32G X 4 MM MISC, Use 1 as directed in the morning, at noon, in the evening, and at bedtime (4 times a day) with insulin    lipase/protease/amylase (CREON ) 36000 UNITS CPEP capsule, Take 1 capsule (36,000 Units total) by mouth 3 (three) times daily with meals.   metroNIDAZOLE  (FLAGYL ) 250 MG tablet, Take 1 tablet (250 mg total) by mouth 3 (three) times daily for 10 days.   Multiple Vitamin (MULTIVITAMIN) tablet, Take 1 tablet by mouth daily.   pantoprazole  (PROTONIX ) 40 MG tablet, Take 1 tablet (40 mg total) by mouth 2 (two) times daily before a meal.   traZODone  (DESYREL ) 50 MG tablet, Take 1 tablet by mouth daily.   TRUEplus Lancets 28G MISC, Use as directed to test blood sugar  Medical History:  Past Medical History:  Diagnosis Date   'Light-for-dates' infant with signs of fetal malnutrition 04/12/2021   Adrenal insufficiency 04/12/2021   Chronic kidney disease    Per pt, fluctuates between Stage IIIB and IV from  April 2023.   Diabetes mellitus without complication (HCC)    Ketoacidosis 03/21/2021   Shock (HCC)    Allergies:  Allergies  Allergen Reactions   Morphine Other (See Comments)    headache       Surgical History:  He  has a past surgical history that includes Back surgery; Appendectomy; Cholecystectomy; Hernia repair; Esophagogastroduodenoscopy (N/A, 09/19/2023); enteroscopy (N/A, 09/21/2023); and Givens capsule study (N/A, 09/21/2023). Family History:  His family history includes Breast cancer in his mother and sister; Hypertension in his paternal grandfather; Ovarian cancer in his maternal grandmother; Pancreatic cancer in his maternal grandmother; Thyroid disease in his mother.  REVIEW OF SYSTEMS  : All other systems reviewed and negative except where noted in the History of Present Illness.  PHYSICAL EXAM: BP 112/78   Pulse 74   Ht 6' 4 (1.93 m)   Wt 214 lb 6 oz (97.2 kg)   BMI 26.09 kg/m  Physical Exam   GENERAL APPEARANCE: Well nourished, in no apparent distress. HEENT: No cervical lymphadenopathy, unremarkable thyroid, sclerae anicteric, conjunctiva pink. RESPIRATORY: Respiratory effort normal, breath sounds equal bilaterally without rales, rhonchi, or wheezing. CARDIO: Regular rate and rhythm with occasional extra beat, no murmurs, rubs, or gallops, peripheral pulses intact. ABDOMEN: Soft, non-distended, active bowel sounds in all four quadrants, tenderness in lower region, no rebound, no mass appreciated. RECTAL: Declines. MUSCULOSKELETAL: Full range of motion, normal gait, without edema. SKIN: Dry, intact without rashes or lesions. No jaundice. NEURO: Alert, oriented, no focal deficits. PSYCH: Cooperative, normal mood and affect.      Alan JONELLE Coombs, PA-C 2:57 PM

## 2024-06-16 NOTE — Patient Instructions (Addendum)
 Your provider has requested that you go to the basement level for lab work before leaving today. Press B on the elevator. The lab is located at the first door on the left as you exit the elevator.   Small intestinal bacterial overgrowth (SIBO) occurs when there is an abnormal increase in the overall bacterial population in the small intestine -- particularly types of bacteria not commonly found in that part of the digestive tract. Small intestinal bacterial overgrowth (SIBO) commonly results when a circumstance -- such as surgery or disease -- slows the passage of food and waste products in the digestive tract, creating a breeding ground for bacteria.  Signs and symptoms of SIBO often include: Loss of appetite Abdominal pain Nausea Bloating An uncomfortable feeling of fullness after eating Diarrhea or constipation, depending on the type of gas produced  What foods trigger SIBO? While foods arent the original cause of SIBO, certain foods do encourage the overgrowth of the wrong bacteria in your small intestine. If youre feeding them their favorite foods, theyre going to grow more, and that will trigger more of your SIBO symptoms. By the same token, you can help reduce the overgrowth by starving the problematic bacteria of their favorite foods. This strategy has led to a number of proposed SIBO eating plans. The plans vary, and so do individual results. But in general, they tend to recommend limiting carbohydrates.  These include: Sugars and sweeteners. Fruits and starchy vegetables. Dairy products. Grains.  Can do trial of low-dose metronidazole  250 mg 3 times daily for 10 days. I recommend that the patient abstain from all alcohol while taking metronidazole .   To avoid side effects of metronidazole , I recommend that he stick to simple meals and not eat rich or spicy food.  He should always try to take your metronidazole  after a meal or snack. Most common side effects include nausea,  stomach upset and rash.   IBS- can consider linzess samples if the antibiotic does not help  First do a trial off milk/lactose products if you use them.  Add fiber like benefiber or citracel once a day Increase activity Can do trial of IBGard which is over the counter for AB pain- Take 1-2 capsules once a day for maintence or twice a day during a flare    FODMAP stands for fermentable oligo-, di-, mono-saccharides and polyols (1). These are the scientific terms used to classify groups of carbs that are difficult for our body to digest and that are notorious for triggering digestive symptoms like bloating, gas, loose stools and stomach pain.   You can try low FODMAP diet  - start with eliminating just one column at a time that you feel may be a trigger for you. - the table at the very bottom contains foods that are low in FODMAPs   Sometimes trying to eliminate the FODMAP's from your diet is difficult or tricky, if you are stuggling with trying to do the elimination diet you can try an enzyme.  There is a food enzymes that you sprinkle in or on your food that helps break down the FODMAP. You can read more about the enzyme by going to this site: https://fodzyme.com/   Due to recent changes in healthcare laws, you may see the results of your imaging and laboratory studies on MyChart before your provider has had a chance to review them.  We understand that in some cases there may be results that are confusing or concerning to you. Not all laboratory results  come back in the same time frame and the provider may be waiting for multiple results in order to interpret others.  Please give us  48 hours in order for your provider to thoroughly review all the results before contacting the office for clarification of your results.    _______________________________________________________  If your blood pressure at your visit was 140/90 or greater, please contact your primary care physician to follow  up on this.  _______________________________________________________  If you are age 37 or older, your body mass index should be between 23-30. Your Body mass index is 26.09 kg/m. If this is out of the aforementioned range listed, please consider follow up with your Primary Care Provider.  If you are age 54 or younger, your body mass index should be between 19-25. Your Body mass index is 26.09 kg/m. If this is out of the aformentioned range listed, please consider follow up with your Primary Care Provider.   ________________________________________________________  The [facility] providers would like to encourage you to use MYCHART to communicate with providers for non-urgent requests or questions.  Due to long hold times on the telephone, sending your provider a message by [facility] may be a faster and more efficient way to get a response.  Please allow 48 business hours for a response.  Please remember that this is for non-urgent requests.  _______________________________________________________  [facility] is using a team-based approach to care.  Your team is made up of your doctor and two to three APPS. Our APPS (Nurse Practitioners and Physician Assistants) work with your physician to ensure care continuity for you. They are fully qualified to address your health concerns and develop a treatment plan. They communicate directly with your gastroenterologist to care for you. Seeing the Advanced Practice Practitioners on your physician's team can help you by facilitating care more promptly, often allowing for earlier appointments, access to diagnostic testing, procedures, and other specialty referrals.

## 2024-06-19 ENCOUNTER — Other Ambulatory Visit: Payer: Self-pay

## 2024-06-19 DIAGNOSIS — D509 Iron deficiency anemia, unspecified: Secondary | ICD-10-CM

## 2024-07-30 ENCOUNTER — Inpatient Hospital Stay

## 2024-07-30 ENCOUNTER — Inpatient Hospital Stay: Attending: Oncology | Admitting: Oncology

## 2024-07-30 VITALS — BP 124/80 | HR 81 | Temp 97.2°F | Resp 16 | Wt 217.1 lb

## 2024-07-30 DIAGNOSIS — E611 Iron deficiency: Secondary | ICD-10-CM

## 2024-07-30 LAB — CBC WITH DIFFERENTIAL (CANCER CENTER ONLY)
Abs Immature Granulocytes: 0.01 10*3/uL (ref 0.00–0.07)
Basophils Absolute: 0 10*3/uL (ref 0.0–0.1)
Basophils Relative: 1 %
Eosinophils Absolute: 0.1 10*3/uL (ref 0.0–0.5)
Eosinophils Relative: 1 %
HCT: 39 % (ref 39.0–52.0)
Hemoglobin: 13.2 g/dL (ref 13.0–17.0)
Immature Granulocytes: 0 %
Lymphocytes Relative: 16 %
Lymphs Abs: 0.9 10*3/uL (ref 0.7–4.0)
MCH: 31.1 pg (ref 26.0–34.0)
MCHC: 33.8 g/dL (ref 30.0–36.0)
MCV: 91.8 fL (ref 80.0–100.0)
Monocytes Absolute: 0.5 10*3/uL (ref 0.1–1.0)
Monocytes Relative: 9 %
Neutro Abs: 4.1 10*3/uL (ref 1.7–7.7)
Neutrophils Relative %: 73 %
Platelet Count: 142 10*3/uL — ABNORMAL LOW (ref 150–400)
RBC: 4.25 MIL/uL (ref 4.22–5.81)
RDW: 13.8 % (ref 11.5–15.5)
Smear Review: NORMAL
WBC Count: 5.5 10*3/uL (ref 4.0–10.5)
nRBC: 0 % (ref 0.0–0.2)

## 2024-07-30 LAB — IRON AND IRON BINDING CAPACITY (CC-WL,HP ONLY)
Iron: 110 ug/dL (ref 45–182)
Saturation Ratios: 34 % (ref 17.9–39.5)
TIBC: 322 ug/dL (ref 250–450)
UIBC: 212 ug/dL

## 2024-07-30 LAB — CMP (CANCER CENTER ONLY)
ALT: 23 U/L (ref 0–44)
AST: 26 U/L (ref 15–41)
Albumin: 4.6 g/dL (ref 3.5–5.0)
Alkaline Phosphatase: 78 U/L (ref 38–126)
Anion gap: 10 (ref 5–15)
BUN: 25 mg/dL — ABNORMAL HIGH (ref 8–23)
CO2: 26 mmol/L (ref 22–32)
Calcium: 9.1 mg/dL (ref 8.9–10.3)
Chloride: 98 mmol/L (ref 98–111)
Creatinine: 1.94 mg/dL — ABNORMAL HIGH (ref 0.61–1.24)
GFR, Estimated: 39 mL/min — ABNORMAL LOW
Glucose, Bld: 175 mg/dL — ABNORMAL HIGH (ref 70–99)
Potassium: 4.4 mmol/L (ref 3.5–5.1)
Sodium: 135 mmol/L (ref 135–145)
Total Bilirubin: 0.7 mg/dL (ref 0.0–1.2)
Total Protein: 7.3 g/dL (ref 6.5–8.1)

## 2024-07-30 LAB — FOLATE: Folate: 12.2 ng/mL

## 2024-07-30 LAB — FERRITIN: Ferritin: 70 ng/mL (ref 24–336)

## 2024-07-30 LAB — LACTATE DEHYDROGENASE: LDH: 209 U/L (ref 105–235)

## 2024-07-30 NOTE — Progress Notes (Unsigned)
 "  [facility]  HEMATOLOGY CLINIC CONSULTATION NOTE   PATIENT NAME: Allen Russell   MR#: [id number] DOB: Dec 25, 1960  DATE OF SERVICE: 07/30/2024  Patient Care Team: Allen Bitters, PA-C as PCP - General (Physician Assistant)  REASON FOR CONSULTATION/ CHIEF COMPLAINT:  Evaluation of anemia.  ASSESSMENT & PLAN:   Allen Russell is a 64 y.o. 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  No problem-specific Assessment & Plan notes found for this encounter.   Assessment and Plan Assessment & Plan       I reviewed lab results and outside records for this visit and discussed relevant results with the patient. Diagnosis, plan of care and treatment options were also discussed in detail with the patient. Opportunity provided to ask questions and answers provided to his apparent satisfaction. Provided instructions to call our clinic with any problems, questions or concerns prior to return visit. I recommended to continue follow-up with PCP and sub-specialists. He verbalized understanding and agreed with the plan. No barriers to learning was detected.  Allen Cervi, MD [facility] - A DEPT OF [facility] [address] [place] [address] Dept: [id number] Dept Fax: [phone]  07/30/2024 2:12 PM  HISTORY OF PRESENT ILLNESS:  Discussed the use of AI scribe software for clinical note transcription with the patient, who gave verbal consent to proceed.  History of Present Illness     On 06/16/2024, labs at his  gastroenterologist office showed hemoglobin of 14.5, hematocrit 42.7, MCV 89.9.  Normal white count and platelet count.  Creatinine 1.83, otherwise unremarkable CMP.  Iron  studies showed evidence of iron  deficiency with ferritin of 17.  Iron  saturation was normal at 20%. Vitamin B12 was normal at 843.  He was referred to us  for further evaluation of iron  deficiency state.   ***He denies recent chest pain on exertion, shortness of breath on minimal exertion, pre-syncopal episodes, or palpitations.  ***He had not noticed any recent bleeding such as epistaxis, hematuria or hematochezia  ***The patient denies over the counter NSAID ingestion. He is not *** on antiplatelets agents.  His last colonoscopy was ***  ***He had no prior history or diagnosis of cancer. His age appropriate screening programs are up-to-date.  ***He denies any pica and eats a variety of diet.  ***He never donated blood or received blood transfusion  ***The patient was prescribed oral iron  supplements and he takes ***  MEDICAL HISTORY:  Past Medical History:  Diagnosis Date   'Light-for-dates' infant with signs of fetal malnutrition 04/12/2021   Adrenal insufficiency 04/12/2021   Chronic kidney disease    Per pt, fluctuates between Stage IIIB and IV from April 2023.   Diabetes mellitus without complication (HCC)    Ketoacidosis 03/21/2021   Shock (HCC)     SURGICAL HISTORY: Past Surgical History:  Procedure Laterality Date   APPENDECTOMY     BACK SURGERY     CHOLECYSTECTOMY     ENTEROSCOPY N/A 09/21/2023   Procedure: ENTEROSCOPY;  Surgeon: Suzann Inocente HERO, MD;  Location: [facility] ENDOSCOPY;  Service: Gastroenterology;  Laterality: N/A;   ESOPHAGOGASTRODUODENOSCOPY N/A 09/19/2023   Procedure: EGD (ESOPHAGOGASTRODUODENOSCOPY);  Surgeon: Suzann,  Inocente HERO, MD;  Location: [facility] ENDOSCOPY;  Service: Gastroenterology;  Laterality: N/A;   GIVENS CAPSULE STUDY N/A 09/21/2023   Procedure: IMAGING PROCEDURE, GI TRACT,  INTRALUMINAL, VIA CAPSULE;  Surgeon: Suzann Inocente HERO, MD;  Location: [facility] ENDOSCOPY;  Service: Gastroenterology;  Laterality: N/A;   HERNIA REPAIR      SOCIAL HISTORY: He reports that he has never smoked. He has never used smokeless tobacco. He reports that he does not currently use alcohol. He reports that he does not currently use drugs. Social History   Socioeconomic History   Marital status: Single    Spouse name: Not on file   Number of children: Not on file   Years of education: Not on file   Highest education level: Bachelor's degree (e.g., BA, AB, BS)  Occupational History   Not on file  Tobacco Use   Smoking status: Never   Smokeless tobacco: Never  Vaping Use   Vaping status: Never Used  Substance and Sexual Activity   Alcohol use: Not Currently    Comment: last drink in 2020   Drug use: Not Currently    Comment: 1998 ectasy   Sexual activity: Yes    Partners: Male    Birth control/protection: None  Other Topics Concern   Not on file  Social History Narrative   Not on file   Social Drivers of Health   Tobacco Use: Low Risk (06/03/2024)   Received from [facility]   Patient History    Smoking Tobacco Use: Never    Smokeless Tobacco Use: Never    Passive Exposure: Never  Financial Resource Strain: Not on file  Food Insecurity: Food Insecurity Present (07/26/2024)   Epic    Worried About Programme Researcher, Broadcasting/film/video in the Last Year: Sometimes true    [employer] of Food in the Last Year: Sometimes true  Transportation Needs: No Transportation Needs (07/26/2024)   Epic    Lack of Transportation (Medical): No    Lack of Transportation (Non-Medical): No  Physical Activity: Not on file  Stress: Not on file  Social Connections: Not on file  Intimate Partner Violence: Not At Risk (09/18/2023)   Humiliation, Afraid, Rape, and Kick questionnaire    Fear of Current or Ex-Partner: No    Emotionally Abused: No    Physically Abused: No    Sexually Abused: No  Depression  (PHQ2-9): Low Risk (07/30/2024)   Depression (PHQ2-9)    PHQ-2 Score: 0  Alcohol Screen: Not on file  Housing: Unknown (07/26/2024)   Epic    Unable to Pay for Housing in the Last Year: Patient declined    Number of Times Moved in the Last Year: 1    Homeless in the Last Year: No  Utilities: Not At Risk (07/26/2024)   Epic    Threatened with loss of utilities: No  Health Literacy: Not on file    FAMILY HISTORY: Family History  Problem Relation Age of Onset   Breast cancer Mother    Thyroid disease Mother    Breast cancer Sister    Pancreatic cancer Maternal Grandmother    Ovarian cancer Maternal Grandmother    Hypertension Paternal Grandfather    Colon polyps Neg Hx    Colon cancer Neg Hx    Esophageal cancer Neg Hx    Rectal cancer Neg Hx    Stomach cancer Neg Hx     ALLERGIES:  He is allergic to morphine.  MEDICATIONS:  Current Outpatient Medications  Medication Sig Dispense Refill  BAQSIMI ONE PACK 3 MG/DOSE POWD Place 1 spray into the nose once.     Blood Glucose Monitoring Suppl (TRUE METRIX METER) w/Device KIT Use up to four times daily as directed. 1 kit 0   CEQUR SIMPLICITY 2U DEVI Change patch every 3-4 days     Cholecalciferol (VITAMIN D3) 75 MCG (3000 UT) TABS Take 3,000 Units by mouth daily.     Continuous Glucose Sensor (DEXCOM G7 SENSOR) MISC SMARTSIG:Every 10 Days     cyclobenzaprine  (FLEXERIL ) 10 MG tablet TAKE 1 TABLET BY MOUTH AS NEEDED AT BEDTIME FOR MUSCLE SPASM     DESCOVY 200-25 MG tablet Take 1 tablet by mouth daily.     diazepam (VALIUM) 2 MG tablet Take 2 mg by mouth at bedtime as needed (for leg cramps).     glucose blood (TRUE METRIX BLOOD GLUCOSE TEST) test strip Use as directed to test blood sugar 200 each 2   insulin  aspart (NOVOLOG ) 100 UNIT/ML injection Inject 0-5 Units into the skin 2 (two) times daily as needed (for BG greater than 180).     Insulin  Pen Needle 32G X 4 MM MISC Use 1 as directed in the morning, at noon, in the evening, and  at bedtime (4 times a day) with insulin  100 each 3   lipase/protease/amylase (CREON ) 36000 UNITS CPEP capsule Take 1 capsule (36,000 Units total) by mouth 3 (three) times daily with meals. 100 capsule 5   Multiple Vitamin (MULTIVITAMIN) tablet Take 1 tablet by mouth daily.     pantoprazole  (PROTONIX ) 40 MG tablet Take 1 tablet (40 mg total) by mouth 2 (two) times daily before a meal. 180 tablet 3   tadalafil (CIALIS) 20 MG tablet Take 20 mg by mouth as needed for erectile dysfunction.     traZODone  (DESYREL ) 50 MG tablet Take 1 tablet by mouth daily.     TRESIBA FLEXTOUCH 100 UNIT/ML FlexTouch Pen INJECT 12 AT BEDTIME     TRUEplus Lancets 28G MISC Use as directed to test blood sugar 100 each 0   No current facility-administered medications for this visit.    REVIEW OF SYSTEMS:    Review of Systems - Oncology  All other pertinent systems were reviewed and were negative except as mentioned above.  PHYSICAL EXAMINATION:   Onc Performance Status - 07/30/24 1404       ECOG Perf Status   ECOG Perf Status Ambulatory and capable of all selfcare but unable to carry out any work activities.  Up and about more than 50% of waking hours      KPS SCALE   KPS % SCORE Cares for self, unable to carry on normal activity or to do active work          Vitals:   07/30/24 1353  BP: 124/80  Pulse: 81  Resp: 16  Temp: (!) 97.2 F (36.2 C)  SpO2: 100%   Filed Weights   07/30/24 1353  Weight: 217 lb 1.6 oz (98.5 kg)    Physical Exam  ***  LABORATORY DATA:   I have reviewed the data as listed.  No results found for any visits on 07/30/24.  RADIOGRAPHIC STUDIES:  No recent pertinent imaging studies available to review.  No orders of the defined types were placed in this encounter.   Future Appointments  Date Time Provider Department Center  07/30/2024  2:30 PM [facility] None     I spent a total of *** minutes during this encounter with the patient including  review of chart and various tests results, discussions about plan of care and coordination of care plan.  This document was completed utilizing speech recognition software. Grammatical errors, random word insertions, pronoun errors, and incomplete sentences are an occasional consequence of this system due to software limitations, ambient noise, and hardware issues. Any formal questions or concerns about the content, text or information contained within the body of this dictation should be directly addressed to the provider for clarification.  "
